# Patient Record
Sex: Female | Born: 1945 | Race: Black or African American | Hispanic: No | Marital: Married | Smoking: Never smoker
Health system: Southern US, Community
[De-identification: ages and names within clinical notes are randomized; demographics above are authoritative.]

## PROBLEM LIST (undated history)

## (undated) DIAGNOSIS — I509 Heart failure, unspecified: Secondary | ICD-10-CM

## (undated) DIAGNOSIS — K219 Gastro-esophageal reflux disease without esophagitis: Secondary | ICD-10-CM

## (undated) DIAGNOSIS — I10 Essential (primary) hypertension: Secondary | ICD-10-CM

## (undated) DIAGNOSIS — Q210 Ventricular septal defect: Secondary | ICD-10-CM

## (undated) DIAGNOSIS — I4891 Unspecified atrial fibrillation: Secondary | ICD-10-CM

## (undated) DIAGNOSIS — E785 Hyperlipidemia, unspecified: Secondary | ICD-10-CM

## (undated) DIAGNOSIS — F419 Anxiety disorder, unspecified: Secondary | ICD-10-CM

## (undated) DIAGNOSIS — I272 Pulmonary hypertension, unspecified: Secondary | ICD-10-CM

## (undated) HISTORY — PX: TOTAL ABDOMINAL HYSTERECTOMY W/ BILATERAL SALPINGOOPHORECTOMY: SHX83

## (undated) HISTORY — PX: CARDIAC CATHETERIZATION: SHX172

## (undated) HISTORY — PX: EYE SURGERY: SHX253

---

## 2008-03-16 ENCOUNTER — Other Ambulatory Visit: Payer: Self-pay

## 2008-03-16 ENCOUNTER — Emergency Department: Payer: Self-pay | Admitting: Emergency Medicine

## 2009-12-26 ENCOUNTER — Emergency Department: Payer: Self-pay | Admitting: Emergency Medicine

## 2010-08-19 ENCOUNTER — Ambulatory Visit: Payer: Self-pay | Admitting: Cardiovascular Disease

## 2010-09-29 ENCOUNTER — Ambulatory Visit: Payer: Self-pay | Admitting: Family Medicine

## 2010-10-16 ENCOUNTER — Ambulatory Visit: Payer: Self-pay | Admitting: Family Medicine

## 2010-11-16 ENCOUNTER — Ambulatory Visit: Payer: Self-pay | Admitting: Family Medicine

## 2011-02-23 ENCOUNTER — Inpatient Hospital Stay: Payer: Self-pay | Admitting: Internal Medicine

## 2011-02-23 DIAGNOSIS — I369 Nonrheumatic tricuspid valve disorder, unspecified: Secondary | ICD-10-CM

## 2011-02-23 DIAGNOSIS — R000 Tachycardia, unspecified: Secondary | ICD-10-CM

## 2011-02-23 DIAGNOSIS — R7989 Other specified abnormal findings of blood chemistry: Secondary | ICD-10-CM

## 2011-11-03 ENCOUNTER — Emergency Department: Payer: Self-pay | Admitting: Emergency Medicine

## 2012-07-08 ENCOUNTER — Inpatient Hospital Stay: Payer: Self-pay | Admitting: Student

## 2012-07-08 DIAGNOSIS — I4891 Unspecified atrial fibrillation: Secondary | ICD-10-CM

## 2012-07-08 DIAGNOSIS — I059 Rheumatic mitral valve disease, unspecified: Secondary | ICD-10-CM

## 2012-07-08 LAB — TROPONIN I: Troponin-I: 0.11 ng/mL — ABNORMAL HIGH

## 2012-07-08 LAB — CK TOTAL AND CKMB (NOT AT ARMC)
CK, Total: 71 U/L (ref 21–215)
CK, Total: 71 U/L (ref 21–215)
CK-MB: 1.5 ng/mL (ref 0.5–3.6)

## 2012-07-08 LAB — COMPREHENSIVE METABOLIC PANEL
Anion Gap: 10 (ref 7–16)
BUN: 9 mg/dL (ref 7–18)
Calcium, Total: 9 mg/dL (ref 8.5–10.1)
Chloride: 106 mmol/L (ref 98–107)
Co2: 26 mmol/L (ref 21–32)
Creatinine: 0.66 mg/dL (ref 0.60–1.30)
EGFR (African American): >60
Osmolality: 284 (ref 275–301)
SGOT(AST): 15 U/L (ref 15–37)
Sodium: 142 mmol/L (ref 136–145)

## 2012-07-08 LAB — URINALYSIS, COMPLETE
Bacteria: NONE SEEN
Glucose,UR: NEGATIVE mg/dL (ref 0–75)
Leukocyte Esterase: NEGATIVE
Nitrite: NEGATIVE
Protein: 100
RBC,UR: <1 /HPF (ref 0–5)
WBC UR: <1 /HPF (ref 0–5)

## 2012-07-08 LAB — APTT: Activated PTT: 92.1 secs — ABNORMAL HIGH (ref 23.6–35.9)

## 2012-07-08 LAB — CBC
HGB: 15.7 g/dL (ref 12.0–16.0)
MCH: 29.4 pg (ref 26.0–34.0)
MCV: 90 fL (ref 80–100)
RBC: 5.33 10*6/uL — ABNORMAL HIGH (ref 3.80–5.20)
RDW: 16.5 % — ABNORMAL HIGH (ref 11.5–14.5)
WBC: 10.9 10*3/uL (ref 3.6–11.0)

## 2012-07-09 LAB — CK TOTAL AND CKMB (NOT AT ARMC): CK-MB: 2.7 ng/mL (ref 0.5–3.6)

## 2012-07-09 LAB — CBC WITH DIFFERENTIAL/PLATELET
Basophil %: 1.1 %
Eosinophil #: 0.2 10*3/uL (ref 0.0–0.7)
HGB: 14.5 g/dL (ref 12.0–16.0)
Lymphocyte %: 34.7 %
MCH: 29.1 pg (ref 26.0–34.0)
MCHC: 32.8 g/dL (ref 32.0–36.0)
MCV: 89 fL (ref 80–100)
Monocyte #: 0.8 x10 3/mm (ref 0.2–0.9)
Neutrophil #: 5.8 10*3/uL (ref 1.4–6.5)
Neutrophil %: 55 %
RDW: 16.6 % — ABNORMAL HIGH (ref 11.5–14.5)
WBC: 10.6 10*3/uL (ref 3.6–11.0)

## 2012-07-09 LAB — PROTIME-INR: INR: 1

## 2012-07-09 LAB — LIPID PANEL: Ldl Cholesterol, Calc: 111 mg/dL — ABNORMAL HIGH (ref 0–100)

## 2012-07-09 LAB — BASIC METABOLIC PANEL
Co2: 26 mmol/L (ref 21–32)
Creatinine: 0.62 mg/dL (ref 0.60–1.30)
EGFR (Non-African Amer.): >60
Glucose: 100 mg/dL — ABNORMAL HIGH (ref 65–99)
Osmolality: 282 (ref 275–301)
Potassium: 4 mmol/L (ref 3.5–5.1)
Sodium: 141 mmol/L (ref 136–145)

## 2012-07-09 LAB — HEMOGLOBIN A1C: Hemoglobin A1C: 6.3 % (ref 4.2–6.3)

## 2012-07-10 LAB — APTT: Activated PTT: 123.9 secs — ABNORMAL HIGH (ref 23.6–35.9)

## 2013-02-02 ENCOUNTER — Inpatient Hospital Stay: Payer: Self-pay | Admitting: Internal Medicine

## 2013-02-02 DIAGNOSIS — I4891 Unspecified atrial fibrillation: Secondary | ICD-10-CM

## 2013-02-02 LAB — COMPREHENSIVE METABOLIC PANEL WITH GFR
Albumin: 3.7 g/dL
Alkaline Phosphatase: 58 U/L
Anion Gap: 8
BUN: 14 mg/dL
Bilirubin,Total: 0.4 mg/dL
Calcium, Total: 8.8 mg/dL
Chloride: 106 mmol/L
Co2: 25 mmol/L
Creatinine: 0.63 mg/dL
EGFR (African American): >60
EGFR (Non-African Amer.): >60
Glucose: 118 mg/dL — ABNORMAL HIGH
Osmolality: 279
Potassium: 4.2 mmol/L
SGOT(AST): 19 U/L
SGPT (ALT): 23 U/L
Sodium: 139 mmol/L
Total Protein: 7.6 g/dL

## 2013-02-02 LAB — CBC
HCT: 46.7 % (ref 35.0–47.0)
MCH: 29.6 pg (ref 26.0–34.0)
MCHC: 33 g/dL (ref 32.0–36.0)
MCV: 90 fL (ref 80–100)
Platelet: 272 10*3/uL (ref 150–440)
RDW: 16.7 % — ABNORMAL HIGH (ref 11.5–14.5)

## 2013-02-02 LAB — URINALYSIS, COMPLETE
Bilirubin,UR: NEGATIVE
Glucose,UR: NEGATIVE mg/dL (ref 0–75)
Ketone: NEGATIVE
Leukocyte Esterase: NEGATIVE
Protein: NEGATIVE
RBC,UR: <1 /HPF (ref 0–5)
Specific Gravity: 1.006 (ref 1.003–1.030)
WBC UR: 1 /HPF (ref 0–5)

## 2013-02-02 LAB — PROTIME-INR: Prothrombin Time: 23.2 secs — ABNORMAL HIGH (ref 11.5–14.7)

## 2013-02-02 LAB — CK TOTAL AND CKMB (NOT AT ARMC)
CK, Total: 61 U/L (ref 21–215)
CK-MB: 1.1 ng/mL (ref 0.5–3.6)

## 2013-02-02 LAB — TROPONIN I: Troponin-I: 0.13 ng/mL — ABNORMAL HIGH

## 2013-02-03 DIAGNOSIS — I369 Nonrheumatic tricuspid valve disorder, unspecified: Secondary | ICD-10-CM

## 2013-02-03 DIAGNOSIS — Q211 Atrial septal defect: Secondary | ICD-10-CM

## 2013-02-03 LAB — CBC WITH DIFFERENTIAL/PLATELET
Basophil #: 0.1 10*3/uL (ref 0.0–0.1)
Basophil %: 1 %
Eosinophil #: 0.2 10*3/uL (ref 0.0–0.7)
HCT: 41.4 % (ref 35.0–47.0)
HGB: 13.8 g/dL (ref 12.0–16.0)
Lymphocyte #: 3 10*3/uL (ref 1.0–3.6)
MCHC: 33.4 g/dL (ref 32.0–36.0)
MCV: 91 fL (ref 80–100)
Neutrophil %: 45.9 %
RBC: 4.57 10*6/uL (ref 3.80–5.20)
RDW: 16.3 % — ABNORMAL HIGH (ref 11.5–14.5)

## 2013-02-03 LAB — BASIC METABOLIC PANEL
Anion Gap: 7 (ref 7–16)
BUN: 14 mg/dL (ref 7–18)
Calcium, Total: 8.2 mg/dL — ABNORMAL LOW (ref 8.5–10.1)
Chloride: 110 mmol/L — ABNORMAL HIGH (ref 98–107)
Creatinine: 0.6 mg/dL (ref 0.60–1.30)
EGFR (African American): >60
Potassium: 3.9 mmol/L (ref 3.5–5.1)

## 2013-02-03 LAB — TROPONIN I: Troponin-I: 0.19 ng/mL — ABNORMAL HIGH

## 2013-02-03 LAB — PROTIME-INR: INR: 2

## 2013-02-03 LAB — LIPID PANEL: Ldl Cholesterol, Calc: 152 mg/dL — ABNORMAL HIGH (ref 0–100)

## 2013-02-03 LAB — MAGNESIUM: Magnesium: 2 mg/dL

## 2015-03-05 NOTE — Discharge Summary (Signed)
PATIENT NAME:  Haley Adams, Haley Adams MR#:  [id number] DATE OF BIRTH:  1946-04-05  DATE OF ADMISSION:  07/08/2012 DATE OF DISCHARGE:  07/10/2012  PRIMARY CARE PHYSICIAN: Leim FabryBarbara Aldridge, MD   PRIMARY CARDIOLOGIST: Dr. Elesa MassedWard at [facility]   CONSULTANTS: Dr. Mariah MillingGollan and Dr. Johney FrameAllred from [facility]    CHIEF COMPLAINT: Palpitations.   DISCHARGE DIAGNOSES:  1. New onset atrial fibrillation with rapid ventricular response. 2. History of VSD. 3. Possible PFO. 4. Eisenmenger syndrome. 5. Asthma. 6. Pulmonary hypertension. 7. Elevated troponin from demand ischemia.  8. History of NSTEMI in 2012.  9. History of hypertension.   DISCHARGE MEDICATIONS:  1. Metoprolol 50 mg 1 tab 2 times a day.  2. Aspirin 81 mg 1 tab once a day.  3. Fish Oil 1600 mg once a day  4. Clindamycin 300 mg two caps orally as needed one hour prior to surgical appointments. 5. Tracleer 125 mg 1 tab 2 times a day.  6. Vitamin D3 5000 international units 1/2 tab 1 tab daily.  7. Spironolactone 25 mg 1 tab once a day as needed for fluid build-up. 8. Vitamin B2 1 tab once a day. 9. Advair 250/50 one puff inhaled two times a day. 10. Tadalafil 20 mg 2 tabs once a day.  11. Warfarin 5 mg daily.  12. Diltiazem 360 mg extended-release 1 tab daily.   DIET: Low sodium, low fat, low cholesterol.   ACTIVITY: As tolerated.   FOLLOW-UP:  1. Please follow-up with your primary care physician for INR check within 1 to 2 days.  2. Please follow-up with your cardiologist within 1 to 2 weeks.  3. Please check your INR in 1 to 2 days. Keep the INR between 2 and 3 as discussed.    DISPOSITION: Home.   HISTORY OF PRESENT ILLNESS: For full details of history and physical, please see the dictation on 07/08/2012 by Dr. Jacques NavyAhmadzia. Briefly, this is a 69 year old African American female with uncorrected VSD, Eisenmenger syndrome, and pulmonary hypertension who presented with palpitation and in the ER was found to have significant tachycardia  with rates as high as 180's and was given diltiazem IV and admitted to the hospitalist service.   SIGNIFICANT LABS AND IMAGING: Initial troponin 0.11, then 0.29, then 0.29. CK-MB were within normal limits x3. Initial creatinine 0.66, potassium 3.8. LDL 111. Initial WBC 10.9, hemoglobin 15.7. INR on August 24th was 1. Urinalysis not suggestive of infection.   Echocardiogram showing EF of 50 to 55% with 1.29 cm in size VSD. RV is moderately dilated. A patent foramen ovale is suspected. Mild to moderate mitral regurgitation and tricuspid regurgitation. RVSP is greater than 60 mmHg. Elevated RVSP consistent with severe pulmonary hypertension.  X-ray of the chest, one view, showing findings consistent with CHF and mild pulmonary interstitial edema.   HOSPITAL COURSE: The patient was admitted to the hospitalist service. For the atrial fibrillation, although Cardizem drip was initially ordered it was not eventually started and patient was transitioned to the diltiazem p.o. 30 mg q.6 hours which was gradually increased. The patient was seen by Cardiology from the [facility]. Echocardiogram was obtained and Cardizem was increased to 360 mg. With that dose in addition to the beta-blocker the heart rate control improved substantially and currently is in 70 to 80's. An echocardiogram was obtained the result of which is above. I discussed the case with Dr. Johney FrameAllred today who is okay with discharge with Coumadin without Lovenox bridge at this point. The patient has no history of diabetes,  age is less than 75. I discussed with her to keep the INR between 2 and 3 and follow-up with her primary care physician and primary cardiologist. The patient did have a bump in troponins likely from the tachycardia. The patient did not have any chest pain. While hospitalized her pulmonary hypertension and asthma medications were continued. At this point she will be discharged with outpatient follow-up.   DISPOSITION: Home.   CODE  STATUS: FULL CODE.   TOTAL TIME SPENT: 35 minutes.    ____________________________ Krystal Eaton, MD sa:drc D: 07/10/2012 10:13:49 ET T: 07/10/2012 13:24:34 ET JOB#: [id number]  cc: Krystal Eaton, MD, <Dictator> Katina Dung. Dayna Barker, MD Krystal Eaton MD ELECTRONICALLY SIGNED 07/27/2012 0:16

## 2015-03-05 NOTE — Consult Note (Signed)
General Aspect 69 year old [place] American female with history of uncorrected VSD with Eisenmenger syndrome with severe pulmonary hypertension status post catheterization four years ago showing no significant coronary artery disease, presenting with palpitations. Cardiology was consulted for atrial fibrillation.   this morning at 8:15 she was in the kitchen and about to take her morning medications. she felt  rapid heart rate without any chest pains, shortness of breath or dizziness. It was felt to be irregular. She has previously felt this before, but "never had atrial fibrillation".   In the ER,  she was found to have atrial fibrillation with rapid ventricular rate.Rate 180 bpm. She did not have any chest pain. She denies having any recent urinary tract infection or upper respiratory infection symptoms, no abdominal symptoms either. She does report having a low grade fever?   She received diltiazem IV 25 mg x1. heart rate improved to  70s to 90s. She was started on diltiazem 30 mg Q6 and metoprolol 50 BID. On evaluation this PM, heart rate is in the 120s.  She reports having a "sulfa" allergy, and not on lasix. She has had waxing waning abd swelling    Present Illness . PAST MEDICAL HISTORY:  1. Hypertension. 2. Uncorrected ventricular septal defect (VSD) 3. Anxiety. 4. Asthma. 5. History of Eisenmenger syndrome. 6. Pulmonary hypertension. 7. History of non-ST-elevation myocardial infarction in 2012.   PAST SURGICAL HISTORY:  1. Hysterectomy.  2. Tubal ligation. 3. IUD removal. 4. Therapeutic abortion.   ALLERGIES: Sulfa, penicillin, and Tylenol.   CURRENT MEDICATIONS:  1. Adcirca 20 mg 2 tabs once a day. 2. Advair 250/50 mcg one puff twice a day. 3. Aspirin 81 mg two tabs once a day. 4. Clindamycin p.r.n. prior to invasive procedures.  5. Fish oil 1600 mg once a day. 6. Lisinopril 2.5 mg daily.  7. Metoprolol tartrate 50 mg twice a day.  8. Spironolactone 25 mg as needed  for fluid build-up. 9. Tracleer 125 mg twice a day. 10. Vitamin B12 1 tab daily.  11. Vitamin D3 2500 international units daily.   FAMILY HISTORY: No significant history of coronary artery disease.   SOCIAL HISTORY: No tobacco, alcohol, or drug use.   Physical Exam:   GEN well developed, well nourished, no acute distress    HEENT red conjunctivae, hearing intact to voice    NECK supple  No masses    RESP normal resp effort  clear BS    CARD Regular rate and rhythm  Murmur    Murmur Systolic    Systolic Murmur Out flow    ABD denies tenderness  soft    LYMPH negative neck    EXTR negative cyanosis/clubbing, negative edema    SKIN normal to palpation    NEURO motor/sensory function intact    PSYCH alert, A+O to time, place, person, good insight   Review of Systems:   Subjective/Chief Complaint palpitations    General: Weakness  nervous    Skin: No Complaints    ENT: No Complaints    Eyes: No Complaints    Neck: No Complaints    Respiratory: No Complaints    Cardiovascular: Palpitations    Gastrointestinal: No Complaints    Genitourinary: No Complaints    Vascular: No Complaints    Musculoskeletal: No Complaints    Neurologic: No Complaints    Hematologic: No Complaints    Endocrine: No Complaints    Psychiatric: No Complaints    Review of Systems: All other systems were reviewed  and found to be negative    Medications/Allergies Reviewed Medications/Allergies reviewed    (Removed):        Admit Diagnosis:   ATRAIL FIBRILLATION: 08-Jul-2012, Active, ATRAIL FIBRILLATION  Home Medications: Medication Instructions Status  metoprolol tartrate 50 mg oral tablet 1 tab(s) orally 2 times a day  Active  lisinopril 5 mg oral tablet 0.5 tab(s) orally once a day Active  aspirin 81 mg tablet 2 tab(s) orally once a day Active  Fish Oil oral capsule 1600 milligram(s) orally once a day Active  clindamycin 300 mg oral capsule 2 cap(s) orally once, As  Needed 1 hour prior to appointment Active  Tracleer 125 mg tablet 1 tab(s) orally 2 times a day  Active  Vitamin D3 5000 intl units oral tablet 0.5 tab(s) (2500 units) orally once a day Active  Adcirca 20 mg oral tablet 2 tab(s) orally once a day Active  spironolactone 25 mg oral tablet 1 tab(s) orally once a day, As Needed for fluid build-up Active  Vitamin B2 1 tab(s) orally once a day Active  Advair Diskus 250 mcg-50 mcg inhalation powder 1 puff(s) inhaled 2 times a day Active   Lab Results:  Hepatic:  23-Aug-13 08:51    Bilirubin, Total 0.4   Alkaline Phosphatase 67   SGPT (ALT) 17   SGOT (AST) 15   Total Protein, Serum 7.8   Albumin, Serum 3.9  Routine Chem:  23-Aug-13 08:51    Result Comment TROPONIN - RESULTS VERIFIED BY REPEAT TESTING.  - CALLED TO TINA CARR AT 9485 07/08/2012  - VFM  - READ-BACK PROCESS PERFORMED.  Result(s) reported on 08 Jul 2012 at 09:59AM.   Glucose, Serum  135   BUN 9   Creatinine (comp) 0.66   Sodium, Serum 142   Potassium, Serum 3.8   Chloride, Serum 106   CO2, Serum 26   Calcium (Total), Serum 9.0   Osmolality (calc) 284   eGFR (African American) >60   eGFR (Non-African American) >60 (eGFR values <73m/min/1.73 m2 may be an indication of chronic kidney disease (CKD). Calculated eGFR is useful in patients with stable renal function. The eGFR calculation will not be reliable in acutely ill patients when serum creatinine is changing rapidly. It is not useful in  patients on dialysis. The eGFR calculation may not be applicable to patients at the low and high extremes of body sizes, pregnant women, and vegetarians.)   Anion Gap 10  Cardiac:  23-Aug-13 08:51    CK, Total 71   CPK-MB, Serum 1.5 (Result(s) reported on 08 Jul 2012 at 09:43AM.)   Troponin I  0.11 (0.00-0.05 0.05 ng/mL or less: NEGATIVE  Repeat testing in 3-6 hrs  if clinically indicated. >0.05 ng/mL: POTENTIAL  MYOCARDIAL INJURY. Repeat  testing in 3-6 hrs if  clinically  indicated. NOTE: An increase or decrease  of 30% or more on serial  testing suggests a  clinically important change)  Routine Coag:  23-Aug-13 08:51    Activated PTT (APTT) 29.2 (A HCT value >55% may artifactually increase the APTT. In one study, the increase was an average of 19%. Reference: "Effect on Routine and Special Coagulation Testing Values of Citrate Anticoagulant Adjustment in Patients with High HCT Values." American Journal of Clinical Pathology 2006;126:400-405.)  Routine Hem:  23-Aug-13 08:51    WBC (CBC) 10.9   RBC (CBC)  5.33   Hemoglobin (CBC) 15.7   Hematocrit (CBC)  47.9   Platelet Count (CBC) 236 (Result(s) reported on 08 Jul 2012 at  09:32AM.)   MCV 90   MCH 29.4   MCHC 32.7   RDW  16.5   EKG:   Interpretation EKG shows atrial fibrillation at rate of 180 bpm, improved to 97 bpm on follow up ekg    PCN: Hives  Tylenol: Headaches  Sulfa drugs: Unknown  Vital Signs/Nurse's Notes: **Vital Signs.:   23-Aug-13 15:22   Vital Signs Type Routine   Temperature Temperature (F) 99.4   Celsius 37.4   Temperature Source Oral   Pulse Pulse 83   Systolic BP Systolic BP 585   Diastolic BP (mmHg) Diastolic BP (mmHg) 73   Mean BP 89   Pulse Ox % Pulse Ox % 91   Pulse Ox Activity Level  At rest   Oxygen Delivery Room Air/ 21 %     Impression 69 year old [place] American female with history of uncorrected VSD with Eisenmenger syndrome with severe pulmonary hypertension status post catheterization four years ago showing no significant coronary artery disease, presenting with palpitations. Cardiology was consulted for atrial fibrillation.  1) Atrial fibrillation: Rate still poorly controlled on po meds: Will increase diltiazem to 60 mg q6, continue metoprolol 50 BID Hesitant to start amiodarone infusion at this time. Could consider starting amiodarone if no rate improvement with other meds. --Onset of atrial fib appeared to be 8/23 in AM.  -She appears to be  tolerating atrial fib well. Given underlying structural disease, she may be difficult to convert back to NSR and maintain this rhythm.  --Consider start warfarin  2) VSD/Eisenmengers Severe pulmonary HTN on echo She reports "sulfa" allergy and not on lasix. Currently appears comfortable. Would continue outpt meds. If she develops worsening SOB, could consider fluid restriction, rate control, possibly even cardioversion.   Electronic Signatures: Ida Rogue (MD)  (Signed 23-Aug-13 19:17)  Authored: General Aspect/Present Illness, History and Physical Exam, Review of System, Past Medical History, Health Issues, Home Medications, Labs, EKG , Allergies, Vital Signs/Nurse's Notes, Impression/Plan   Last Updated: 23-Aug-13 19:17 by Ida Rogue (MD)

## 2015-03-05 NOTE — H&P (Signed)
PATIENT NAME:  Haley Adams, Haley Adams MR#:  [id number] DATE OF BIRTH:  02/10/1946  DATE OF ADMISSION:  07/08/2012  CHIEF COMPLAINT: Palpitations.  PRIMARY CARE PHYSICIAN: Leim Fabry, MD  CARDIOLOGIST:  Dr. Elesa Massed - [facility]   REFERRING PHYSICIAN: Glennie Isle, MD  HISTORY OF PRESENT ILLNESS: The patient is a pleasant 69 year old African American female with history of uncorrected VSD with Eisenmenger syndrome with pulmonary hypertension status post catheterization four years ago showing no significant coronary artery disease on chart who presents with the above chief complaint. The patient stated that at this morning at 8:15 she was in the kitchen and about to take her morning medications. However, she felt  rapid heart rate without any chest pains, shortness of breath or dizziness. It was felt to be irregular. On arrival here, she was found to have atrial fibrillation with rapid ventricular rate. Per staff it went as high as 180s. The first EKG is at 169 beats minutes. She did not have any chest pain. She denies having any recent urinary tract infection or upper respiratory infection symptoms, no abdominal symptoms either. She received diltiazem IV 25 mg x1 and then diltiazem drip was ordered, however, it has not been restarted as the heart rate has been stabilized in the 70s to 90s. The patient denies having any palpitations now. Hospitalist service was contacted for further evaluation and management.   PAST MEDICAL HISTORY:  1. Hypertension. 2. Uncorrected ventricular septal defect (VSD) 3. Anxiety. 4. Asthma. 5. History of Eisenmenger syndrome. 6. Pulmonary hypertension. 7. History of non-ST-elevation myocardial infarction in 2012.   PAST SURGICAL HISTORY:  1. Hysterectomy.  2. Tubal ligation. 3. IUD removal. 4. Therapeutic abortion.   ALLERGIES: Sulfa, penicillin, and Tylenol.   CURRENT MEDICATIONS:  1. Adcirca 20 mg 2 tabs once a day. 2. Advair 250/50 mcg  one puff twice a day. 3. Aspirin 81 mg two tabs once a day. 4. Clindamycin p.r.n. prior to invasive procedures.  5. Fish oil 1600 mg once a day. 6. Lisinopril 2.5 mg daily.  7. Metoprolol tartrate 50 mg twice a day.  8. Spironolactone 25 mg as needed for fluid build-up. 9. Tracleer 125 mg twice a day. 10. Vitamin B12 1 tab daily.  11. Vitamin D3 2500 international units daily.   FAMILY HISTORY: No significant history of coronary artery disease.   SOCIAL HISTORY: No tobacco, alcohol, or drug use.  REVIEW OF SYSTEMS: CONSTITUTIONAL: No fever or weight loss. Five pound weight gain in the last several weeks. EYES: No blurry vision or double vision. ENT: No tinnitus or hearing loss. No postnasal drip loss. RESPIRATORY: No cough or wheezing. Has history of asthma. CARDIOVASCULAR: No chest pain, orthopnea, edema, or dyspnea on exertion. Has history of VSD and Eisenmenger syndrome and pulmonary hypertension. GASTROINTESTINAL: No nausea, vomiting, diarrhea, or abdominal pain. GU: Denies dysuria or incontinence. ENDOCRINE: No polyuria or nocturia or thyroid problems. HEME/LYMPH: No anemia or easy bruising. SKIN: No new rashes. MUSCULOSKELETAL: No numbness, ataxia, or cerebrovascular accident. PSYCHIATRIC: No anxiety or insomnia currently. Has history of anxiety in the past.   PHYSICAL EXAMINATION:   VITAL SIGNS: Temperature on arrival 97.8, pulse rate 150, respiratory rate 20, blood pressure on arrival 190/109 and last blood pressure 140s/80s when I was in the room, and oxygen saturation on arrival 98% on room air and currently 91% on room air.   GENERAL: The patient is a pleasant African American female lying in bed in no obvious distress, talking in full sentences.  HEENT: Normocephalic hepatic. Pupils are equal and reactive. Extraocular muscles are intact. No lid lag. Oropharynx clear. Moist mucous membranes. Anicteric sclerae.   NECK: Supple. No thyroid tenderness.   LUNGS: Good effort without  wheezing or rhonchi.   ABDOMEN: Soft, nontender, and nondistended. No organomegaly appreciated. Positive bowel sounds in all quadrants.   HEART: S1 and S2 irregularly, irregular.  No murmurs, rubs, or gallops appreciated.   EXTREMITIES: No significant lower extremity edema.   SKIN: No obvious rashes.   NEUROLOGIC: Cranial nerves II through XII grossly intact. Strength is 5 out of 5 in all extremities.   PSYCH: Pleasant and cooperative. Awake, alert, and oriented x3.   LABORATORY, DIAGNOSTIC AND RADIOLOGIC DATA: Glucose 135, BUN 9, creatinine 0.66, sodium 142, and potassium 3.8. LFTs within normal limits. Initial troponin 0.11. CK-MB 1.5. Hemoglobin 15.7, hematocrit 47.9, and WBC 10.9.   Urinalysis not suggestive of infection.   EKG: Initial EKG is showing atrial fibrillation, rate 169. Another EKG is showing atrial fibrillation with RVR, rate of 180. There are some ST depressions mostly in lateral leads V4, V5, and V6 which appears to be improved somewhat on later EKG but there is diffuse T wave inversions from V2 to V5, on the later EKG. Later EKG shows rate of 97. Also some moderate voltage criteria for left ventricular hypertrophy. No ST elevations apparent.  X-ray of the chest - pending and performed.   ASSESSMENT AND PLAN: We have a pleasant 69 year old African American female with a history of uncorrected VSD with Eisenmenger's syndrome, status post catheterization several years ago without any significant coronary artery disease per chart with pulmonary hypertension, NSTEMI, anxiety, and asthma who presents with acute onset of atrial fibrillation with RVR, currently rate controlled with diltiazem x1. The patient has no significant chest pain although has a positive troponin and some ST depressions on the EKG showing possible ischemia. The patient has no history of atrial fibrillation in the past. At this point, as the rate has been stabilized, we would admit the patient to telemetry,  start the patient on heparin drip and start the patient on Cardizem 30 mg p.o. every six hours in addition to the metoprolol and hold the lisinopril to prevent hypotension. The blood pressure did drop from 190s on arrival to 90s after the diltiazem IV which was given to control the heart rate. The last blood pressure has trended back up to 140s, which allows the addition of the diltiazem at this point. We would obtain a cardiology consult and get an echocardiogram. We would also give aspirin for now. The patient does have elevated troponin, but did not have any chest pain at all today. It is possible this is secondary to demand ischemia from the significant tachycardia she was experiencing. We would cycle the troponins, get an echocardiogram, and see what cardiology has to stay, but she did have a catheterization four years ago without any significant coronary artery disease, per chart, and at this point she has no chest pain. We would continue the aspirin, the beta blocker, check a lipid profile and admit the patient to telemetry. We would continue her pulmonary hypertension medications, resume her Advair and vitamin D.   CODE STATUS: FULL CODE.   TOTAL TIME SPENT: 50 minutes.  ____________________________ Krystal EatonShayiq Nishita Isaacks, MD sa:slb D: 07/08/2012 12:29:50 ET T: 07/08/2012 12:53:39 ET JOB#: [id number]  cc: Krystal EatonShayiq Frankie Scipio, MD, <Dictator> Katina DungBarbara D. Dayna BarkerAldridge, MD Krystal EatonSHAYIQ Keshawn Sundberg MD ELECTRONICALLY SIGNED 07/27/2012 0:16

## 2015-03-08 NOTE — Discharge Summary (Signed)
PATIENT NAME:  Haley Adams, Haley Adams MR#:  [id number] DATE OF BIRTH:  1945/12/17  DATE OF ADMISSION:  02/02/2013 DATE OF DISCHARGE:  02/03/2013  DISCHARGE DIAGNOSES: 1.  Atrial fibrillation with rapid ventricular response status post cardioversion, stable. 2.  Hypotension due to receiving Cardizem, resolved. 3.  Chronic pulmonary hypertension.  HISTORY OF PRESENT ILLNESS:  The patient is a 69 year old female who presented with heart beating very fast, The patient reported that she was trying to fix the breakfast and took her medication earlier in the morning. was breathing very passed.   EMS was called and she arrived in ED her heart rate was 130, she was in atrial fibrillation and RVR.  She was given IV diltiazem and improvement in her heart rate; however, the blood pressure dropped to 80s and given IV fluid bolus.  Her blood pressure was improved and she was noted to have 8 to 10 beats of ventricular tachycardia and so she was admitted for further management.   HOSPITAL COURSE AND STAY:  She also had a history of Eisenmenger syndrome and pulmonary hypertension, history of ventricular septal defect and status post surgeries for that. She also has had atrial fibrillation in the past and cardioversion was done in September at [facility] and she was following with cardiologist over there.  These were all reasons to admit her after the atrial fibrillation episode.  Cardiologist, Dr. Julien Nordmann, followed her while she was in the hospital and he did the cardioversion.  The patient was in normal sinus rhythm. He advised if her blood pressure remained stable, she can be discharged home and she can follow in [facility] with her cardiologist and may consider to change sotalol to some other anti-arrhythmic.  For Eisenmenger's syndrome, he advised to avoid calcium channel blockers.  Other medical issues addressed during this hospital stay:   1.  Ventricular tachycardia, 8 to 10 beats, remained stable. 2.   Elevated cardiac enzymes that was due to atrial fibrillation with RVR.  The cardiologist saw her and no further work-up advised. 3.  Atrial fibrillation on Coumadin.  We continued that here and advised her to follow with her primary care physician in [place]. 4.  Pulmonary hypertension. Continue tadalafil and Tracleer.    LABORATORY RESULTS IN THE HOSPITAL:   Hemoglobin 15.4, INR 2.1, BMP was stable on admission, TSH at 3.62 and magnesium was 2.1   CODE STATUS: Full code.   CONDITION ON DISCHARGE: Stable.   MEDICATIONS ON DISCHARGE:  Tracleer 125 mg tablet 2 times a day, diazepam 2 mg oral tablet every 6 hours as needed for anxiety and nervousness, Advair 1 puff every 12 hours, lisinopril 2.5 mg oral tablet once a day, sotalol 80 mg oral tablet take 1/2 tablet 2 times a day,  tadalafil 20 mg oral tablet once a day, torsemide 20 mg oral tablet once a day as needed, Coumadin 9 to 10 mg once a day ass he was taking before, omega-3 fatty acid orally once a day, vitamin B2 mg oral tablet once a day and vitamin D3 1000 and international units oral capsule once a day.   DIET: Low sodium.   DIET CONSISTENCY ON DISCHARGE:  Regular.   ACTIVITY LIMITATION:  None.   TIME FRAME TO FOLLOW UP:  One to 2 weeks. Advised to follow with cardiologist in [facility] in 1 to 2 weeks about considering changing anti-arrhythmic medications or if he wants to continue sotalol.   Total Time Spent: 45 minutes.     ____________________________ Heath Gold  Arrie EasternG. Desmond Tufano, MD vgv:ct D: 02/07/2013 09:01:14 ET T: 02/07/2013 09:28:45 ET JOB#: [id number]  cc: Hope PigeonVaibhavkumar G. Elisabeth PigeonVachhani, MD, <Dictator> Altamese DillingVAIBHAVKUMAR Cleotha Whalin MD ELECTRONICALLY SIGNED 02/16/2013 14:51

## 2015-03-08 NOTE — Consult Note (Signed)
General Aspect Haley Adams is a 69 yo African-American female with PMHx s/f persistent atrial fibrillation, Eisenmenger syndrome (VSD), PAH, HTN, anxiety and migraines who presents to [facility] ED today with complaints of tachy-palpitations, and found to be in atrial fibrillation with RVR.   She is followed by Dr. Leonides Schanz at [facility]. She reports experiencing her first episode of atrial fibrillation in 07/2012- described as tachy-palpitations. She underwent DCCV, and was started on Sotalol and Coumadin. She was transiently on Digoxin, but this was discontinued for unknown reasons. She had what sounds like a right heart cath 4-5 years ago. Normal stress test "years ago." 2D echo last month looked "good." No h/o MI or CAD.   She had been in her USOH until this AM around 9:30 AM. She had a cold glass of orange juice, then an hour later experienced tachy-palpitations with associates SOB and lightheadedness. No chest pain or syncope. She has noted a small increase in stress. Denies EtOH, tobacco, elicit drug, OTC supplement or caffeine use. No recent fevers, chills or active bleeding. No LE edema, PND or orthopnea. She has been compliant with all medications. She reports INRs have been therapeutic for > 1 month.   In the ED, EKG and telemetry revealed atrial fibrillation with RVR.  Initial trop-I mildly elevated. INR 2.1. CXR with mild CHF. BMET unremarkable. Currently resting comfortably in NAD.   Present Illness . SOCIAL HISTORY: Does not smoke. Drinks occasionally.   FAMILY HISTORY: Positive for hypertension.   Physical Exam:  GEN well developed, thin   HEENT red conjunctivae, PERRL   NECK supple  No masses  trachea midline   RESP normal resp effort  no use of accessory muscles  fine bibasilar rales   CARD Irregular rate and rhythm  Tachycardic  Normal, S1, S2  Murmur  No murmur   ABD denies tenderness  soft  normal BS  no Adominal Mass   EXTR negative cyanosis/clubbing, negative edema    SKIN normal to palpation, No rashes   NEURO follows commands, motor/sensory function intact   PSYCH alert, A+O to time, place, person, good insight   Review of Systems:  Subjective/Chief Complaint tachy-palpitations   General: No Complaints   Skin: No Complaints   ENT: No Complaints   Eyes: No Complaints   Neck: No Complaints   Respiratory: No Complaints  Short of breath   Cardiovascular: Dyspnea   Gastrointestinal: No Complaints   Genitourinary: No Complaints   Vascular: No Complaints   Musculoskeletal: No Complaints   Neurologic: Dizzness   Hematologic: No Complaints   Endocrine: No Complaints   Psychiatric: No Complaints   Review of Systems: All other systems were reviewed and found to be negative   Medications/Allergies Reviewed Medications/Allergies reviewed     Migraines:    eisenmenger:    pulmonary htn:    VSD:    hysterectomy:     Diltiazem injection,  ( Cardizem injection )  10 mg, IV push, once, 02-Feb-2013, Completed, Standard   LORazepam injection,  ( Ativan injection )  0.5 mg, IV push, STAT  Indication: Anxiety/ Seizure/ Antiemetic Adjunct/ Preop Sedation, 02-Feb-2013, Completed, Standard   Acetaminophen * tablet, ( Tylenol (325 mg) tablet)  650 mg Oral q4h PRN for pain or temp. greater than 100.4  - Indication: Pain/Fever, 02-Feb-2013, Active, Standard   Ondansetron injection, ( Zofran injection )  4 mg, IV push, q4h PRN for Nausea/Vomiting  Indication: Nausea/ Vomiting, 02-Feb-2013, Active, Standard   Sotalol Tablet, ( Betapace AF)  120 mg Oral bid  - Indication: Sinus Rhythm in Symptomatic Atrial Fibrillation/Flutter, 02-Feb-2013, Active, Standard   Albuterol Oral inhaler, 2 puff(s) Inhalation q4h while awake with Spacer (Op  - Indication: Bronchodilator  Instructions:  Dustin Folks Code: SendToRx], 02-Feb-2013, Active, Standard   Cholecalciferol  tablet, ( Vitamin D3)  1000 unit(s) Oral daily  - Indication: Vit D  deficiency, 02-Feb-2013, Active, Standard   Diazepam tablet, ( Valium)  2 mg Oral q6h PRN for anxiety  - Indication: Anxiety/ Convulsive Disorders/ Muscle Relaxation/ Alcohol Withdrawal, 02-Feb-2013, Active, Standard   Fluticasone/Salmeterol 250/50 inhaler, ( Advair Diskus 250/50 inhaler )  1 puff(s) Inhalation bid  Instructions:  RINSE MOUTH AFTER USE, 02-Feb-2013, Active, Standard   Omega 3 Fatty Acid 1 Gram Capsule, ( Lovaza)  1 gram Oral daily, 02-Feb-2013, Active, Standard   Non-Formulary Medication, tadalafil  20 mg Oral daily, 02-Feb-2013, Active, Standard   Non-Formulary Medication, tracleer  125 mg Oral daily, 02-Feb-2013, Active, Standard   Warfarin tablet,  ( Coumadin)  9 mg Oral q5pm  - Indication: Anticoagulant, Monitor Anticoags per hospital protocol  Instructions:  Dustin Folks Code: Black with pkg], 02-Feb-2013, Active, Standard  Home Medications: Medication Instructions Status  diazepam 2 mg oral tablet 1 tab(s) orally every 6 hours, As Needed- for Anxiety, Nervousness  Active  Advair Diskus 250 mcg-50 mcg inhalation powder 1 puff(s) inhaled every 12 hours Active  lisinopril 2.5 mg oral tablet 1 tab(s) orally once a day Active  sotalol 80 mg oral tablet 1.5 tab(s) (120 mg) orally 2 times a day Active  tadalafil 20 mg oral tablet 1 tab(s) orally once a day Active  torsemide 20 mg oral tablet 1 tab(s) orally once a day, As Needed for swelling Active  warfarin 9-10 milligram(s) orally once a day (in the evening) per INR levels Active  albuterol CFC free 90 mcg/inh inhalation aerosol 2 puff(s) inhaled every 6 hours, As Needed- for Shortness of Breath  Active  omega-3 polyunsaturated fatty acids 1000 mg oral capsule 1 cap(s) orally once a day Active  Vitamin B2 100 mg oral tablet 1 tab(s) orally once a day Active  Vitamin D3 1000 intl units oral capsule 1 cap(s) orally once a day Active  Tracleer 125 mg tablet 1 tab(s) orally 2 times a day  Active   Lab Results:   Thyroid:  20-Mar-14 12:38   Thyroid Stimulating Hormone 3.62 (0.45-4.50 (International Unit)  ----------------------- Pregnant patients have  different reference  ranges for TSH:  - - - - - - - - - -  Pregnant, first trimetser:  0.36 - 2.50 uIU/mL)  Hepatic:  20-Mar-14 12:38   Bilirubin, Total 0.4  Alkaline Phosphatase 58  SGPT (ALT) 23  SGOT (AST) 19  Total Protein, Serum 7.6  Albumin, Serum 3.7  Routine Chem:  20-Mar-14 10:47   Result Comment pt/inr - notified mark winstead rn@er  [phone] [id number]  - of hemolyzed specimen rw  Result(s) reported on 02 Feb 2013 at 11:13AM.    12:38   Magnesium, Serum 2.1 (1.8-2.4 THERAPEUTIC RANGE: 4-7 mg/dL TOXIC: > 10 mg/dL  -----------------------)  Result Comment troponin - RESULTS VERIFIED BY REPEAT TESTING.  - called to mark winstead rn@er   - [id number] rw  - READ-BACK PROCESS PERFORMED.  Result(s) reported on 02 Feb 2013 at 01:32PM.  Glucose, Serum  118  BUN 14  Creatinine (comp) 0.63  Sodium, Serum 139  Potassium, Serum 4.2  Chloride, Serum 106  CO2, Serum 25  Calcium (Total), Serum 8.8  Osmolality (calc) 279  eGFR (African American) >60  eGFR (Non-African American) >60 (eGFR values <24m/min/1.73 m2 may be an indication of chronic kidney disease (CKD). Calculated eGFR is useful in patients with stable renal function. The eGFR calculation will not be reliable in acutely ill patients when serum creatinine is changing rapidly. It is not useful in  patients on dialysis. The eGFR calculation may not be applicable to patients at the low and high extremes of body sizes, pregnant women, and vegetarians.)  Anion Gap 8  Cardiac:  20-Mar-14 12:38   Troponin I  0.08 (0.00-0.05 0.05 ng/mL or less: NEGATIVE  Repeat testing in 3-6 hrs  if clinically indicated. >0.05 ng/mL: POTENTIAL  MYOCARDIAL INJURY. Repeat  testing in 3-6 hrs if  clinically indicated. NOTE: An increase or decrease  of 30% or more on serial  testing suggests  a  clinically important change)  Routine UA:  20-Mar-14 14:33   Color (UA) Straw  Clarity (UA) Clear  Glucose (UA) Negative  Bilirubin (UA) Negative  Ketones (UA) Negative  Specific Gravity (UA) 1.006  Blood (UA) Negative  pH (UA) 6.0  Protein (UA) Negative  Nitrite (UA) Negative  Leukocyte Esterase (UA) Negative (Result(s) reported on 02 Feb 2013 at 02:55PM.)  RBC (UA) <1 /HPF  WBC (UA) 1 /HPF  Bacteria (UA) TRACE  Epithelial Cells (UA) NONE SEEN  Mucous (UA) PRESENT  Hyaline Cast (UA) 7 /LPF (Result(s) reported on 02 Feb 2013 at 02:55PM.)   EKG:  Interpretation atrial fibrillation with RVR, downsloping ST depressions V1-V5 with associated TWIs   Rate 134   Additional Comments No prior comparison   Radiology Results: XRay:    20-Mar-14 10:37, Chest Portable Single View  Chest Portable Single View   REASON FOR EXAM:    Chest pain  COMMENTS:       PROCEDURE: DXR - DXR PORTABLE CHEST SINGLE VIEW  - Feb 02 2013 10:37AM     RESULT: Comparison made to prior study Apr 07, 2012. Lungs clear.   Cardiomegaly with pulmonary vascular prominence. These findings are   stable. Mild pulmonary interstitial pulmonary edema.    IMPRESSION:  Mild congestive heart failure. Similar findings noted 8 23   2013.        Verified By: TOsa Craver M.D., MD    PCN: Hives  Tylenol: Headaches  Sulfa drugs: Unknown  Vital Signs/Nurse's Notes: **Vital Signs.:   20-Mar-14 17:23  Vital Signs Type Admission  Temperature Temperature (F) 98.6  Celsius 37  Temperature Source oral  Pulse Pulse 64  Respirations Respirations 18  Systolic BP Systolic BP 94  Diastolic BP (mmHg) Diastolic BP (mmHg) 66  Mean BP 75  Pulse Ox % Pulse Ox % 92  Pulse Ox Activity Level  At rest  Oxygen Delivery 2L    Impression Haley Adams is a 69yo African-American female with PMHx s/f persistent atrial fibrillation, Eisenmenger syndrome (VSD), PAH, HTN, anxiety and migraines who [facility] with complaints of tachy-palpitations, and found to be in atrial fibrillation with RVR.  1. Atrial fibrillation with RVR --Avoid Ca channel blockers Continue sotolol BID plan for cardioversion in AM  Her INRs per her PCP's office have been therapeutic for > 1 month (2.8 on 2/6, 2.3 on 3/6- have placed faxed report in chart).  2. Eisenmenger syndrome --Avoid Ca channel blockers (she has recovered from BP drop in ER) On low rate IVF for systolic pressures in 794W now up to 90s.  3. Chronic Coumadin anticoagulation INRs  have been >2   4. PAH  5. HTN  6. Anxiety   Electronic Signatures: Meriel Pica (PA-C)  (Signed 20-Mar-14 16:26)  Authored: General Aspect/Present Illness, History and Physical Exam, Review of System, Past Medical History, Orders, Home Medications, Labs, EKG , Radiology, Allergies, Impression/Plan Ida Rogue (MD)  (Signed 20-Mar-14 18:29)  Authored: General Aspect/Present Illness, History and Physical Exam, Review of System, Labs, Vital Signs/Nurse's Notes, Impression/Plan  Co-Signer: General Aspect/Present Illness, History and Physical Exam, Review of System, Past Medical History, Orders, Home Medications, Labs, Radiology, Allergies, Impression/Plan   Last Updated: 20-Mar-14 18:29 by Ida Rogue (MD)

## 2015-03-08 NOTE — H&P (Signed)
PATIENT NAME:  Haley Adams, Haley Adams MR#:  [id number] DATE OF BIRTH:  [birth year]  DATE OF ADMISSION:  02/02/2013  PRIMARY CARE PROVIDER: Dr. Dayna Barker at [facility].  REFERRING PHYSICIAN: Dr. Cyril Loosen, ED.   CHIEF COMPLAINT: Palpitations.   HISTORY OF PRESENT ILLNESS: The patient is a 69 year old who presents with heart beating very fast. The patient reports that she was trying to fix her breakfast and took her medications earlier today and then heart was beating very fast. EMS was called. When she arrived to the ED, her heart rate was in the 130s. She was in atrial fibrillation with RVR. The patient received a dose of IV diltiazem with improvement in her heart rate; however, her blood pressure dropped into the 80s. She was given IV fluid bolus. Her blood pressure is currently improved, but also during this brief ED visit, the patient was noted to have 8 to 10 beats of ventricular tachycardia. Therefore, I was asked to admit the patient. The patient reports that she has not felt well for the past few days and has been very tired and weak. She reports that she has had a feeling of being bloated in her stomach. Has not had any chest pains. She has some chronic shortness of breath due to her pulmonary hypertension that is unchanged. She denies any swelling in her lower extremities.   PAST MEDICAL HISTORY: Significant for: 1.  Migraines.  2.  History of Eisenmenger syndrome.  3.  History of pulmonary hypertension.  4.  History of VSD, which she was born with.  5.  History of atrial fibrillation, status post cardioversion in September at [facility].  6.  History of C. difficile.   PAST SURGICAL HISTORY:  1.  Status post hysterectomy.  2.  Status post IUD removal.   ALLERGIES: PENICILLIN, SULFA DRUGS AND TYLENOL.   CURRENT MEDICATIONS: Advair 250/50 INH q.12, albuterol 2 puffs q.6 p.r.n., diazepam 2 mg q.6 p.r.n., lisinopril 2.5 daily, omega-3 one tab p.o. daily, sotalol 120 mg b.i.d., tadalafil 20 mg  daily, torsemide 20 mg p.r.n. swelling, Tracleer 125 mg 1 tab p.o. b.i.d., vitamin B2 with 100 mg daily, vitamin D3 with 1000 international units daily, warfarin 9 to 10 mg based on INR   SOCIAL HISTORY: Does not smoke. Drinks occasionally.   FAMILY HISTORY: Positive for hypertension.   REVIEW OF SYSTEMS:    CONSTITUTIONAL: Denies any fevers. Complains of fatigue and weakness. No pain. No weight loss. No weight gain.  EYES: No blurred or double vision. No pain. No redness. No inflammation. No glaucoma or cataracts.  ENT: No tinnitus. No ear pain. No hearing loss. No difficulty with swallowing.  RESPIRATORY: Denies any cough, wheezing. No hemoptysis. Has chronic dyspnea. No painful respiration. Has pulmonary hypertension.  CARDIOVASCULAR: Denies any chest pain. Denies any orthopnea or edema. Has history of atrial fibrillation. Has some dyspnea on exertion.  GASTROINTESTINAL: No nausea, vomiting, diarrhea. No abdominal pain. No hematemesis.  GENITOURINARY: Denies any dysuria, hematuria, renal calculus or frequency.  ENDOCRINE: Denies any polyuria, nocturia or thyroid problems.  HEMATOLOGIC AND LYMPHATIC: Denies any major bruisability or bleeding.  SKIN: No acne. No rash. No changes in mole, hair or skin.  MUSCULOSKELETAL: Denies any pain in the neck, back or shoulder.  NEURO: No numbness. No CVA. No TIA. Has a history of headache.  PSYCHIATRIC: No anxiety. No insomnia. No ADD.   PHYSICAL EXAMINATION:  VITAL SIGNS: Temperature 99.1. Pulse on presentation was 130, currently 94. Respirations 20. Blood pressure was 108/67.  GENERAL: The  patient is a well-nourished [place] female, currently not in any acute distress.  HEENT: Head atraumatic, normocephalic. Pupils equally round, reactive to light and accommodation. There is no conjunctival pallor. No scleral icterus. Nasal exam shows no drainage or ulceration. Oropharynx is clear without any exudate.  NECK: No thyromegaly. No carotid  bruits.  CARDIOVASCULAR: Regular rate and rhythm. There is a murmur at the left sternal border. PMI is not displaced.  LUNGS: Clear to auscultation bilaterally without any rales, rhonchi or wheezing.  ABDOMEN: Soft, nontender, nondistended. Positive bowel sounds x 4.  EXTREMITIES: No clubbing, cyanosis or edema.  SKIN: No rash.  LYMPHATICS: No lymph nodes palpable.  VASCULAR: Good DP, PT pulses.  PSYCHIATRIC: Not anxious or depressed.  NEUROLOGICAL: Awake, alert, oriented x 3. No focal deficits.   LABORATORY AND DIAGNOSTIC DATA: EKG showed A. fib with RVR, nonspecific ST-T wave changes. BMP: Glucose 118, BUN 14, creatinine 0.63, sodium 139, potassium 4.2, chloride 106, CO2 of 25, calcium 8.8. LFTs were normal. Troponin 0.08. WBC 6.0, hemoglobin 15.4, platelets 272,000. INR is 2.1.   ASSESSMENT AND PLAN: The patient is a 69 year old African American female with history of pulmonary hypertension, ventricular septal defect, history of atrial fibrillation, presents with palpitations.  1.  Atrial fibrillation with rapid ventricular response: Heart rate still intermittently over 100 due to her blood pressure dropping. We will monitor on telemetry. Hold Cardizem. Will continue sotalol and Coumadin. I have spoken to Dr. Mariah MillingGollan who will see the patient.  2.  Hypotension: Likely due to receiving IV Cardizem. Blood pressure is improved. I will hold her torsemide and angiotensin-converting enzyme inhibitor. Will continue low-dose intravenous fluids. The patient is at high risk of developing fluid overload, especially right heart failure. Will monitor her blood pressure.  3.  Ventricular tachycardia, 8 to 10 beats: Will monitor on telemetry. Follow cardiac enzymes. Have discussed with Dr. Mariah MillingGollan who will see the patient. Will also get echocardiogram of the heart.  4.  Pulmonary hypertension: Will continue her regimen of tadalafil and Tracleer.  5.  Elevated cardiac enzymes: Will follow cardiac enzymes. Likely  due to demand ischemia as a result of her atrial fibrillation with rapid ventricular response. Again, cardiology will see the patient.  6.  Miscellaneous: The patient is already on Coumadin for deep vein thrombosis prophylaxis.   TIME SPENT: 45 minutes.   ____________________________ Lacie ScottsShreyang H. Allena KatzPatel, MD shp:jm D: 02/02/2013 14:32:27 ET T: 02/02/2013 14:48:06 ET JOB#: [id number]  cc: Nyia Tsao H. Allena KatzPatel, MD, <Dictator> Charise CarwinSHREYANG H Leonila Speranza MD ELECTRONICALLY SIGNED 02/03/2013 11:30

## 2015-07-30 ENCOUNTER — Other Ambulatory Visit: Payer: Self-pay | Admitting: Family Medicine

## 2015-07-30 DIAGNOSIS — Z780 Asymptomatic menopausal state: Secondary | ICD-10-CM

## 2015-08-12 ENCOUNTER — Ambulatory Visit
Admission: RE | Admit: 2015-08-12 | Discharge: 2015-08-12 | Disposition: A | Discharge status: Home / Self Care | Payer: Medicare Other | Source: Ambulatory Visit | Attending: Family Medicine | Admitting: Family Medicine

## 2015-08-12 DIAGNOSIS — Z780 Asymptomatic menopausal state: Secondary | ICD-10-CM | POA: Diagnosis present

## 2015-08-12 DIAGNOSIS — Z13820 Encounter for screening for osteoporosis: Secondary | ICD-10-CM | POA: Insufficient documentation

## 2015-08-12 DIAGNOSIS — M8580 Other specified disorders of bone density and structure, unspecified site: Secondary | ICD-10-CM | POA: Insufficient documentation

## 2016-07-20 ENCOUNTER — Emergency Department: Payer: Medicare Other

## 2016-07-20 ENCOUNTER — Emergency Department
Admission: EM | Admit: 2016-07-20 | Discharge: 2016-07-20 | Disposition: A | Discharge status: Home / Self Care | Payer: Medicare Other | Attending: Emergency Medicine | Admitting: Emergency Medicine

## 2016-07-20 DIAGNOSIS — H1132 Conjunctival hemorrhage, left eye: Secondary | ICD-10-CM | POA: Diagnosis not present

## 2016-07-20 DIAGNOSIS — Q210 Ventricular septal defect: Secondary | ICD-10-CM | POA: Diagnosis not present

## 2016-07-20 DIAGNOSIS — I509 Heart failure, unspecified: Secondary | ICD-10-CM | POA: Insufficient documentation

## 2016-07-20 DIAGNOSIS — I672 Cerebral atherosclerosis: Secondary | ICD-10-CM | POA: Insufficient documentation

## 2016-07-20 DIAGNOSIS — Z7901 Long term (current) use of anticoagulants: Secondary | ICD-10-CM | POA: Diagnosis not present

## 2016-07-20 DIAGNOSIS — H109 Unspecified conjunctivitis: Secondary | ICD-10-CM | POA: Diagnosis present

## 2016-07-20 DIAGNOSIS — I110 Hypertensive heart disease with heart failure: Secondary | ICD-10-CM | POA: Diagnosis not present

## 2016-07-20 HISTORY — DX: Pulmonary hypertension, unspecified: I27.20

## 2016-07-20 HISTORY — DX: Ventricular septal defect: Q21.0

## 2016-07-20 HISTORY — DX: Essential (primary) hypertension: I10

## 2016-07-20 HISTORY — DX: Heart failure, unspecified: I50.9

## 2016-07-20 MED ORDER — NAPHAZOLINE HCL 0.1 % OP SOLN: 1.0000 [drp] | Freq: Four times a day (QID) | OPHTHALMIC | 0 refills | Status: DC | PRN

## 2016-07-20 NOTE — ED Provider Notes (Signed)
[facility] Emergency Department Provider Note   ____________________________________________   None    (approximate)  I have reviewed the triage vital signs and the nursing notes.   HISTORY  Chief Complaint Conjunctivitis    HPI Synethia Endicott is a 70 y.o. female patient complaining of left eye redness and a feeling of "uncomfortable" (. Patient denies any pain. Patient said I was normal yesterday. Patient awakened this morning with increased redness and edema. Patient denies any vision change. Patient denies any headaches associated with this complaint. Patient denies any vertigo.   Past Medical History:  Diagnosis Date  . CHF (congestive heart failure) (HCC)   . Hypertension   . Pulmonary hypertension (HCC)   . VSD (ventricular septal defect)     There are no active problems to display for this patient.   History reviewed. No pertinent surgical history.  Prior to Admission medications   Medication Sig Start Date End Date Taking? Authorizing Provider  ambrisentan (LETAIRIS) 5 MG tablet Take 5 mg by mouth daily.   Yes Historical Provider, MD  diazepam (VALIUM) 2 MG tablet Take 2 mg by mouth every 6 (six) hours as needed for anxiety.   Yes Historical Provider, MD  Fluticasone-Salmeterol (ADVAIR) 250-50 MCG/DOSE AEPB Inhale 1 puff into the lungs 2 (two) times daily.   Yes Historical Provider, MD  losartan (COZAAR) 50 MG tablet Take 50 mg by mouth daily.   Yes Historical Provider, MD  warfarin (COUMADIN) 6 MG tablet Take 6 mg by mouth daily. 6 mg for 6 days   Then 7 mg on day 7   Yes Historical Provider, MD    Allergies Penicillins; Sulfa antibiotics; and Tylenol [acetaminophen]  No family history on file.  Social History Social History  Substance Use Topics  . Smoking status: Never Smoker  . Smokeless tobacco: Never Used  . Alcohol use No    Review of Systems Constitutional: No fever/chills Eyes: No visual changes. ENT: No sore  throat. Cardiovascular: Denies chest pain. Respiratory: Denies shortness of breath. Gastrointestinal: No abdominal pain.  No nausea, no vomiting.  No diarrhea.  No constipation. Genitourinary: Negative for dysuria. Musculoskeletal: Negative for back pain. Skin: Negative for rash. Neurological: Negative for headaches, focal weakness or numbness. Endocrine:Hypertension ____________________________________________   PHYSICAL EXAM:  VITAL SIGNS: ED Triage Vitals [07/20/16 1206]  Enc Vitals Group     BP (!) 142/82     Pulse Rate 69     Resp 20     Temp 98.4 F (36.9 C)     Temp Source Oral     SpO2 91 %     Weight 182 lb (82.6 kg)     Height 5\' 6"  (1.676 m)     Head Circumference      Peak Flow      Pain Score      Pain Loc      Pain Edu?      Excl. in GC?     Constitutional: Alert and oriented. Well appearing and in no acute distress. Eyes:Left conjunctiva with hemorrhaging... PERRL. EOMI. Head: Atraumatic. Nose: No congestion/rhinnorhea. Mouth/Throat: Mucous membranes are moist.  Oropharynx non-erythematous. Neck: No stridor.  No cervical spine tenderness to palpation. Hematological/Lymphatic/Immunilogical: No cervical lymphadenopathy. Cardiovascular: Normal rate, regular rhythm. Grossly normal heart sounds.  Good peripheral circulation. Respiratory: Normal respiratory effort.  No retractions. Lungs CTAB. Gastrointestinal: Soft and nontender. No distention. No abdominal bruits. No CVA tenderness. Musculoskeletal: No lower extremity tenderness nor edema.  No joint effusions.  Neurologic:  Normal speech and language. No gross focal neurologic deficits are appreciated. No gait instability. Skin:  Skin is warm, dry and intact. No rash noted. Psychiatric: Mood and affect are normal. Speech and behavior are normal.  ____________________________________________   LABS (all labs ordered are listed, but only abnormal results are displayed)  Labs Reviewed - No data to  display ____________________________________________  EKG   ____________________________________________  RADIOLOGY   ____________________________________________   PROCEDURES  Procedure(s) performed: None  Procedures  Critical Care performed: No  ____________________________________________   INITIAL IMPRESSION / ASSESSMENT AND PLAN / ED COURSE  Pertinent labs & imaging results that were available during my care of the patient were reviewed by me and considered in my medical decision making (see chart for details).  Left conjunctiva hemorrhaging. Discussed negative CT findings with patient. Patient given discharge care instructions. [facility]  Clinical Course     ____________________________________________   FINAL CLINICAL IMPRESSION(S) / ED DIAGNOSES  Final diagnoses:  Conjunctival hemorrhage of left eye      NEW MEDICATIONS STARTED DURING THIS VISIT:  New Prescriptions   No medications on file     Note:  This document was prepared using Dragon voice recognition software and may include unintentional dictation errors.    Joni ReiningRonald K Danzel Marszalek, PA-C 07/20/16 1309    Jennye MoccasinBrian S Quigley, MD 07/20/16 [id number]

## 2016-07-20 NOTE — ED Notes (Signed)
Left eye pain since yesterday  Woke up with redness noted this am  Denies any trauma

## 2016-07-20 NOTE — ED Triage Notes (Signed)
Pt arrives to ER via POV c/o left eye redness and feeling "uncomfotable" in left eye. Denies pain when prompted. Pt states eye normal yesterday. Left eye is very reddened. Denies vision changes. Pt alert and oriented X4, active, cooperative, pt in NAD. RR even and unlabored, color WNL.

## 2016-11-11 ENCOUNTER — Emergency Department: Payer: Medicare Other

## 2016-11-11 ENCOUNTER — Encounter: Payer: Self-pay | Admitting: Emergency Medicine

## 2016-11-11 ENCOUNTER — Inpatient Hospital Stay
Admission: EM | Admit: 2016-11-11 | Discharge: 2016-11-13 | DRG: 871 | Disposition: A | Discharge status: Home / Self Care | Payer: Medicare Other | Attending: Internal Medicine | Admitting: Internal Medicine

## 2016-11-11 DIAGNOSIS — A419 Sepsis, unspecified organism: Secondary | ICD-10-CM | POA: Diagnosis not present

## 2016-11-11 DIAGNOSIS — Z880 Allergy status to penicillin: Secondary | ICD-10-CM

## 2016-11-11 DIAGNOSIS — R7989 Other specified abnormal findings of blood chemistry: Secondary | ICD-10-CM

## 2016-11-11 DIAGNOSIS — Z882 Allergy status to sulfonamides status: Secondary | ICD-10-CM

## 2016-11-11 DIAGNOSIS — I110 Hypertensive heart disease with heart failure: Secondary | ICD-10-CM | POA: Diagnosis present

## 2016-11-11 DIAGNOSIS — Q210 Ventricular septal defect: Secondary | ICD-10-CM

## 2016-11-11 DIAGNOSIS — Z7901 Long term (current) use of anticoagulants: Secondary | ICD-10-CM

## 2016-11-11 DIAGNOSIS — I4891 Unspecified atrial fibrillation: Secondary | ICD-10-CM | POA: Diagnosis present

## 2016-11-11 DIAGNOSIS — R778 Other specified abnormalities of plasma proteins: Secondary | ICD-10-CM

## 2016-11-11 DIAGNOSIS — Z886 Allergy status to analgesic agent status: Secondary | ICD-10-CM

## 2016-11-11 DIAGNOSIS — J189 Pneumonia, unspecified organism: Secondary | ICD-10-CM | POA: Diagnosis present

## 2016-11-11 DIAGNOSIS — R748 Abnormal levels of other serum enzymes: Secondary | ICD-10-CM | POA: Diagnosis not present

## 2016-11-11 DIAGNOSIS — I480 Paroxysmal atrial fibrillation: Secondary | ICD-10-CM | POA: Diagnosis present

## 2016-11-11 DIAGNOSIS — I10 Essential (primary) hypertension: Secondary | ICD-10-CM | POA: Diagnosis present

## 2016-11-11 DIAGNOSIS — F419 Anxiety disorder, unspecified: Secondary | ICD-10-CM | POA: Diagnosis present

## 2016-11-11 DIAGNOSIS — E785 Hyperlipidemia, unspecified: Secondary | ICD-10-CM | POA: Diagnosis present

## 2016-11-11 DIAGNOSIS — Z881 Allergy status to other antibiotic agents status: Secondary | ICD-10-CM

## 2016-11-11 DIAGNOSIS — Z7982 Long term (current) use of aspirin: Secondary | ICD-10-CM

## 2016-11-11 DIAGNOSIS — K219 Gastro-esophageal reflux disease without esophagitis: Secondary | ICD-10-CM | POA: Diagnosis present

## 2016-11-11 DIAGNOSIS — I5042 Chronic combined systolic (congestive) and diastolic (congestive) heart failure: Secondary | ICD-10-CM | POA: Diagnosis present

## 2016-11-11 DIAGNOSIS — I2783 Eisenmenger's syndrome: Secondary | ICD-10-CM | POA: Diagnosis present

## 2016-11-11 DIAGNOSIS — I248 Other forms of acute ischemic heart disease: Secondary | ICD-10-CM | POA: Diagnosis present

## 2016-11-11 HISTORY — DX: Unspecified atrial fibrillation: I48.91

## 2016-11-11 HISTORY — DX: Gastro-esophageal reflux disease without esophagitis: K21.9

## 2016-11-11 HISTORY — DX: Hyperlipidemia, unspecified: E78.5

## 2016-11-11 HISTORY — DX: Anxiety disorder, unspecified: F41.9

## 2016-11-11 LAB — BASIC METABOLIC PANEL
Anion gap: 7 (ref 5–15)
BUN: 9 mg/dL (ref 6–20)
CHLORIDE: 107 mmol/L (ref 101–111)
CO2: 22 mmol/L (ref 22–32)
CREATININE: 0.62 mg/dL (ref 0.44–1.00)
Calcium: 9.1 mg/dL (ref 8.9–10.3)
Glucose, Bld: 115 mg/dL — ABNORMAL HIGH (ref 65–99)
Potassium: 3.9 mmol/L (ref 3.5–5.1)
SODIUM: 136 mmol/L (ref 135–145)

## 2016-11-11 LAB — URINALYSIS, COMPLETE (UACMP) WITH MICROSCOPIC
Bilirubin Urine: NEGATIVE
GLUCOSE, UA: NEGATIVE mg/dL
Ketones, ur: NEGATIVE mg/dL
Leukocytes, UA: NEGATIVE
Nitrite: NEGATIVE
PH: 5 (ref 5.0–8.0)
Protein, ur: NEGATIVE mg/dL
SPECIFIC GRAVITY, URINE: 1.014 (ref 1.005–1.030)

## 2016-11-11 LAB — CBC WITH DIFFERENTIAL/PLATELET
BASOS PCT: 1 %
Basophils Absolute: 0.1 10*3/uL (ref 0–0.1)
Eosinophils Absolute: 0 10*3/uL (ref 0–0.7)
Eosinophils Relative: 0 %
HEMATOCRIT: 46.1 % (ref 35.0–47.0)
HEMOGLOBIN: 15.2 g/dL (ref 12.0–16.0)
LYMPHS ABS: 0.9 10*3/uL — AB (ref 1.0–3.6)
LYMPHS PCT: 8 %
MCH: 28.7 pg (ref 26.0–34.0)
MCHC: 32.9 g/dL (ref 32.0–36.0)
MCV: 87 fL (ref 80.0–100.0)
MONO ABS: 1 10*3/uL — AB (ref 0.2–0.9)
MONOS PCT: 9 %
NEUTROS ABS: 9.7 10*3/uL — AB (ref 1.4–6.5)
NEUTROS PCT: 82 %
Platelets: 202 10*3/uL (ref 150–440)
RBC: 5.29 MIL/uL — ABNORMAL HIGH (ref 3.80–5.20)
RDW: 17.1 % — ABNORMAL HIGH (ref 11.5–14.5)
WBC: 11.7 10*3/uL — ABNORMAL HIGH (ref 3.6–11.0)

## 2016-11-11 LAB — TROPONIN I: TROPONIN I: 0.79 ng/mL — AB (ref <0.03)

## 2016-11-11 LAB — BRAIN NATRIURETIC PEPTIDE: B Natriuretic Peptide: 185 pg/mL — ABNORMAL HIGH (ref 0.0–100.0)

## 2016-11-11 MED ORDER — ASPIRIN 81 MG PO CHEW
324.0000 mg | CHEWABLE_TABLET | Freq: Once | ORAL | Status: AC
  Administered 2016-11-11: 324 mg via ORAL
  Filled 2016-11-11: qty 4

## 2016-11-11 MED ORDER — LEVOFLOXACIN IN D5W 750 MG/150ML IV SOLN
750.0000 mg | Freq: Once | INTRAVENOUS | Status: AC
  Administered 2016-11-11: 750 mg via INTRAVENOUS
  Filled 2016-11-11: qty 150

## 2016-11-11 NOTE — ED Notes (Addendum)
Charge nurse notified of troponin; room assigned; pt taken to room 1 via w/c and placed in hosp gown and on card monitor; report given to care nurse Erie NoeVanessa, RN

## 2016-11-11 NOTE — ED Triage Notes (Addendum)
Pt to ed with c/o sob, cough, fever and burning in chest x 3 days. Increased SOB with ambulation or activity.  [facility]  Per pt her sats are always low, reports they are never higher than 94% due to ventricular septal defect. Pt appears in no acute resp distress while sitting in chair at triage.

## 2016-11-11 NOTE — ED Notes (Addendum)
Reviewed pt's chart and results; orders entered, will recheck vs; Patient ambulatory to triage with steady gait, without difficulty or distress noted; pt updated on plan of care and wait time; voices good understanding; reviewed CXR with Dr Darnelle CatalanMalinda and labs ordered; pt's oxim 88% on ra; st normally around 90% and uses O2 at 4l/min via [place] at night; O2 placed at 3l/min via [place] to bring sat to 92%

## 2016-11-11 NOTE — ED Provider Notes (Addendum)
[facility] Emergency Department Provider Note   ____________________________________________   First MD Initiated Contact with Patient 11/11/16 2223     (approximate)  I have reviewed the triage vital signs and the nursing notes.   HISTORY  Chief Complaint Shortness of Breath and Fever    HPI Haley Adams is a 70 y.o. female patient is peeling been feeling poorly for the last few days. Today at about 1:00 she got worse. She's been coughing up yellow phlegm and possibly some blood in it. Patient's been running a fever of 101 and slightly higher. Patient did more short of breath than usual. Patient actually has had burning pain in her chest for last 3 days. At present she is not having any chest pain but she is short of breath. Patient required more oxygen than usual to keep her oxygen saturations up.   Past Medical History:  Diagnosis Date  . CHF (congestive heart failure) (HCC)   . Hypertension   . Pulmonary hypertension   . VSD (ventricular septal defect)     There are no active problems to display for this patient.   History reviewed. No pertinent surgical history.  Prior to Admission medications   Medication Sig Start Date End Date Taking? Authorizing Provider  ambrisentan (LETAIRIS) 5 MG tablet Take 5 mg by mouth daily.    Historical Provider, MD  diazepam (VALIUM) 2 MG tablet Take 2 mg by mouth every 6 (six) hours as needed for anxiety.    Historical Provider, MD  Fluticasone-Salmeterol (ADVAIR) 250-50 MCG/DOSE AEPB Inhale 1 puff into the lungs 2 (two) times daily.    Historical Provider, MD  losartan (COZAAR) 50 MG tablet Take 50 mg by mouth daily.    Historical Provider, MD  naphazoline (NAPHCON) 0.1 % ophthalmic solution Place 1 drop into the left eye 4 (four) times daily as needed for irritation. 07/20/16   Joni Reiningonald K Smith, PA-C  warfarin (COUMADIN) 6 MG tablet Take 6 mg by mouth daily. 6 mg for 6 days   Then 7 mg on day 7    Historical  Provider, MD    Allergies Penicillins; Sulfa antibiotics; and Tylenol [acetaminophen]  No family history on file.  Social History Social History  Substance Use Topics  . Smoking status: Never Smoker  . Smokeless tobacco: Never Used  . Alcohol use No    Review of Systems Constitutional:fever/chills Eyes: No visual changes. ENT: No sore throat. Cardiovascular:  chest pain. Respiratory:  shortness of breath. Gastrointestinal: No abdominal pain.  No nausea, no vomiting.  No diarrhea.  No constipation. Genitourinary: Negative for dysuria. Musculoskeletal: Negative for back pain. Skin: Negative for rash. Neurological: Negative for headaches, focal weakness or numbness.  10-point ROS otherwise negative.  ____________________________________________   PHYSICAL EXAM:  VITAL SIGNS: ED Triage Vitals [11/11/16 1713]  Enc Vitals Group     BP (!) 155/76     Pulse Rate 95     Resp 20     Temp (!) 101.1 F (38.4 C)     Temp Source Oral     SpO2 93 %     Weight 182 lb (82.6 kg)     Height      Head Circumference      Peak Flow      Pain Score 1     Pain Loc      Pain Edu?      Excl. in GC?     Constitutional: Alert and oriented. Well appearing and in  no acute distress. Eyes: Conjunctivae are normal. PERRL. EOMI. Head: Atraumatic. Nose: No congestion/rhinnorhea. Mouth/Throat: Mucous membranes are moist.  Oropharynx non-erythematous. Neck: No stridor.  Cardiovascular: Normal rate, regular rhythm. Grossly normal heart sounds.  Good peripheral circulation. Respiratory: Normal respiratory effort.  No retractions. Lungs scattered crackles Gastrointestinal: Soft and nontender. No distention. No abdominal bruits. No CVA tenderness. Extremities: No pain there is trace edema in the legs. ____________________________________________   LABS (all labs ordered are listed, but only abnormal results are displayed)  Labs Reviewed  BASIC METABOLIC PANEL - Abnormal; Notable for the  following:       Result Value   Glucose, Bld 115 (*)    All other components within normal limits  TROPONIN I - Abnormal; Notable for the following:    Troponin I 0.79 (*)    All other components within normal limits  URINALYSIS, COMPLETE (UACMP) WITH MICROSCOPIC - Abnormal; Notable for the following:    Color, Urine YELLOW (*)    APPearance CLEAR (*)    Hgb urine dipstick SMALL (*)    Bacteria, UA RARE (*)    Squamous Epithelial / LPF 0-5 (*)    All other components within normal limits  CBC WITH DIFFERENTIAL/PLATELET - Abnormal; Notable for the following:    WBC 11.7 (*)    RBC 5.29 (*)    RDW 17.1 (*)    Neutro Abs 9.7 (*)    Lymphs Abs 0.9 (*)    Monocytes Absolute 1.0 (*)    All other components within normal limits  BRAIN NATRIURETIC PEPTIDE - Abnormal; Notable for the following:    B Natriuretic Peptide 185.0 (*)    All other components within normal limits  CULTURE, BLOOD (ROUTINE X 2)  CULTURE, BLOOD (ROUTINE X 2)   ____________________________________________  EKG  EKG read and interpreted by me shows normal sinus rhythm rate of 95 normal axis there is T-wave inversions inferiorly and in the anterior chest leads V1 through 3. These are not as pronounced as previously. ____________________________________________  RADIOLOGY  Study Result   CLINICAL DATA:  Productive cough since yesterday, shortness of breath.  EXAM: CHEST  2 VIEW  COMPARISON:  02/02/2013  FINDINGS: Cardiomegaly with vascular congestion. No overt edema or confluent airspace opacity. No effusion or acute bony abnormality.  IMPRESSION: Cardiomegaly with vascular congestion.   Electronically Signed   By: Charlett NoseKevin  Dover M.D.   On: 11/11/2016 18:12    ____________________________________________   PROCEDURES  Procedure(s) performed:   Procedures  Critical Care performed:   ____________________________________________   INITIAL IMPRESSION / ASSESSMENT AND PLAN / ED  COURSE  Pertinent labs & imaging results that were available during my care of the patient were reviewed by me and considered in my medical decision making (see chart for details).    Clinical Course      ____________________________________________   FINAL CLINICAL IMPRESSION(S) / ED DIAGNOSES  Final diagnoses:  Community acquired pneumonia, unspecified laterality  Elevated troponin      NEW MEDICATIONS STARTED DURING THIS VISIT:  New Prescriptions   No medications on file     Note:  This document was prepared using Dragon voice recognition software and may include unintentional dictation errors.    Arnaldo NatalPaul F Malinda, MD 11/11/16 [id number]    Arnaldo NatalPaul F Malinda, MD 11/11/16 [phone]

## 2016-11-12 ENCOUNTER — Encounter: Payer: Self-pay | Admitting: Internal Medicine

## 2016-11-12 ENCOUNTER — Inpatient Hospital Stay
Admit: 2016-11-12 | Discharge: 2016-11-12 | Disposition: A | Discharge status: Home / Self Care | Payer: Medicare Other | Attending: Internal Medicine | Admitting: Internal Medicine

## 2016-11-12 DIAGNOSIS — Z7982 Long term (current) use of aspirin: Secondary | ICD-10-CM | POA: Diagnosis not present

## 2016-11-12 DIAGNOSIS — I5042 Chronic combined systolic (congestive) and diastolic (congestive) heart failure: Secondary | ICD-10-CM | POA: Diagnosis present

## 2016-11-12 DIAGNOSIS — Z882 Allergy status to sulfonamides status: Secondary | ICD-10-CM | POA: Diagnosis not present

## 2016-11-12 DIAGNOSIS — J189 Pneumonia, unspecified organism: Secondary | ICD-10-CM | POA: Diagnosis present

## 2016-11-12 DIAGNOSIS — Z880 Allergy status to penicillin: Secondary | ICD-10-CM | POA: Diagnosis not present

## 2016-11-12 DIAGNOSIS — K219 Gastro-esophageal reflux disease without esophagitis: Secondary | ICD-10-CM | POA: Diagnosis present

## 2016-11-12 DIAGNOSIS — Q210 Ventricular septal defect: Secondary | ICD-10-CM | POA: Diagnosis not present

## 2016-11-12 DIAGNOSIS — E785 Hyperlipidemia, unspecified: Secondary | ICD-10-CM | POA: Diagnosis present

## 2016-11-12 DIAGNOSIS — I4891 Unspecified atrial fibrillation: Secondary | ICD-10-CM | POA: Diagnosis present

## 2016-11-12 DIAGNOSIS — I248 Other forms of acute ischemic heart disease: Secondary | ICD-10-CM | POA: Diagnosis present

## 2016-11-12 DIAGNOSIS — R748 Abnormal levels of other serum enzymes: Secondary | ICD-10-CM | POA: Diagnosis present

## 2016-11-12 DIAGNOSIS — F419 Anxiety disorder, unspecified: Secondary | ICD-10-CM | POA: Diagnosis present

## 2016-11-12 DIAGNOSIS — Z886 Allergy status to analgesic agent status: Secondary | ICD-10-CM | POA: Diagnosis not present

## 2016-11-12 DIAGNOSIS — Z881 Allergy status to other antibiotic agents status: Secondary | ICD-10-CM | POA: Diagnosis not present

## 2016-11-12 DIAGNOSIS — A419 Sepsis, unspecified organism: Secondary | ICD-10-CM | POA: Diagnosis present

## 2016-11-12 DIAGNOSIS — Z7901 Long term (current) use of anticoagulants: Secondary | ICD-10-CM | POA: Diagnosis not present

## 2016-11-12 DIAGNOSIS — I10 Essential (primary) hypertension: Secondary | ICD-10-CM | POA: Diagnosis present

## 2016-11-12 DIAGNOSIS — R7989 Other specified abnormal findings of blood chemistry: Secondary | ICD-10-CM

## 2016-11-12 DIAGNOSIS — I2783 Eisenmenger's syndrome: Secondary | ICD-10-CM | POA: Diagnosis present

## 2016-11-12 DIAGNOSIS — R778 Other specified abnormalities of plasma proteins: Secondary | ICD-10-CM | POA: Diagnosis present

## 2016-11-12 DIAGNOSIS — I110 Hypertensive heart disease with heart failure: Secondary | ICD-10-CM | POA: Diagnosis present

## 2016-11-12 DIAGNOSIS — I480 Paroxysmal atrial fibrillation: Secondary | ICD-10-CM | POA: Diagnosis present

## 2016-11-12 LAB — CBC
HEMATOCRIT: 43.9 % (ref 35.0–47.0)
HEMOGLOBIN: 14.5 g/dL (ref 12.0–16.0)
MCH: 29 pg (ref 26.0–34.0)
MCHC: 33.1 g/dL (ref 32.0–36.0)
MCV: 87.7 fL (ref 80.0–100.0)
Platelets: 184 10*3/uL (ref 150–440)
RBC: 5.01 MIL/uL (ref 3.80–5.20)
RDW: 16.9 % — AB (ref 11.5–14.5)
WBC: 13.2 10*3/uL — AB (ref 3.6–11.0)

## 2016-11-12 LAB — BASIC METABOLIC PANEL
ANION GAP: 6 (ref 5–15)
BUN: 9 mg/dL (ref 6–20)
CHLORIDE: 108 mmol/L (ref 101–111)
CO2: 23 mmol/L (ref 22–32)
Calcium: 8.7 mg/dL — ABNORMAL LOW (ref 8.9–10.3)
Creatinine, Ser: 0.66 mg/dL (ref 0.44–1.00)
GFR calc Af Amer: >60 mL/min (ref >60)
GFR calc non Af Amer: >60 mL/min (ref >60)
Glucose, Bld: 102 mg/dL — ABNORMAL HIGH (ref 65–99)
POTASSIUM: 4.2 mmol/L (ref 3.5–5.1)
SODIUM: 137 mmol/L (ref 135–145)

## 2016-11-12 LAB — EXPECTORATED SPUTUM ASSESSMENT W GRAM STAIN, RFLX TO RESP C

## 2016-11-12 LAB — INFLUENZA PANEL BY PCR (TYPE A & B)
Influenza A By PCR: NEGATIVE
Influenza B By PCR: NEGATIVE

## 2016-11-12 LAB — PROTIME-INR
INR: 1.79
PROTHROMBIN TIME: 21 s — AB (ref 11.4–15.2)

## 2016-11-12 LAB — TROPONIN I
TROPONIN I: 1.09 ng/mL — AB (ref <0.03)
TROPONIN I: 0.71 ng/mL — AB (ref <0.03)
Troponin I: 1.59 ng/mL (ref <0.03)

## 2016-11-12 LAB — ECHOCARDIOGRAM COMPLETE
HEIGHTINCHES: 65.5 in
Weight: 2894.4 oz

## 2016-11-12 LAB — EXPECTORATED SPUTUM ASSESSMENT W REFEX TO RESP CULTURE

## 2016-11-12 MED ORDER — ENOXAPARIN SODIUM 40 MG/0.4ML ~~LOC~~ SOLN
40.0000 mg | SUBCUTANEOUS | Status: DC
  Administered 2016-11-12: 40 mg via SUBCUTANEOUS
  Filled 2016-11-12: qty 0.4

## 2016-11-12 MED ORDER — ONDANSETRON HCL 4 MG PO TABS: 4.0000 mg | ORAL_TABLET | Freq: Four times a day (QID) | ORAL | Status: DC | PRN

## 2016-11-12 MED ORDER — ONDANSETRON HCL 4 MG/2ML IJ SOLN: 4.0000 mg | Freq: Four times a day (QID) | INTRAMUSCULAR | Status: DC | PRN

## 2016-11-12 MED ORDER — ASPIRIN EC 81 MG PO TBEC
81.0000 mg | DELAYED_RELEASE_TABLET | Freq: Every day | ORAL | Status: DC
  Administered 2016-11-12 – 2016-11-13 (×2): 81 mg via ORAL
  Filled 2016-11-12 (×2): qty 1

## 2016-11-12 MED ORDER — TORSEMIDE 10 MG PO TABS
10.0000 mg | ORAL_TABLET | Freq: Every day | ORAL | Status: DC
  Filled 2016-11-12 (×2): qty 1

## 2016-11-12 MED ORDER — DIAZEPAM 2 MG PO TABS: 2.0000 mg | ORAL_TABLET | Freq: Four times a day (QID) | ORAL | Status: DC | PRN

## 2016-11-12 MED ORDER — SOTALOL HCL 120 MG PO TABS
120.0000 mg | ORAL_TABLET | Freq: Two times a day (BID) | ORAL | Status: DC
  Administered 2016-11-12 – 2016-11-13 (×3): 120 mg via ORAL
  Filled 2016-11-12 (×5): qty 1

## 2016-11-12 MED ORDER — SODIUM CHLORIDE 0.9% FLUSH
3.0000 mL | Freq: Two times a day (BID) | INTRAVENOUS | Status: DC
  Administered 2016-11-12 – 2016-11-13 (×4): 3 mL via INTRAVENOUS

## 2016-11-12 MED ORDER — CEFTRIAXONE SODIUM-DEXTROSE 1-3.74 GM-% IV SOLR
1.0000 g | INTRAVENOUS | Status: DC
  Administered 2016-11-13: 1 g via INTRAVENOUS
  Filled 2016-11-12: qty 50

## 2016-11-12 MED ORDER — AZITHROMYCIN 500 MG IV SOLR
500.0000 mg | INTRAVENOUS | Status: DC
  Administered 2016-11-12: 500 mg via INTRAVENOUS
  Filled 2016-11-12 (×2): qty 500

## 2016-11-12 MED ORDER — AMBRISENTAN 5 MG PO TABS
5.0000 mg | ORAL_TABLET | Freq: Every day | ORAL | Status: DC
  Administered 2016-11-12: 5 mg via ORAL
  Filled 2016-11-12 (×3): qty 1

## 2016-11-12 MED ORDER — LOSARTAN POTASSIUM 50 MG PO TABS
50.0000 mg | ORAL_TABLET | Freq: Every day | ORAL | Status: DC
  Administered 2016-11-12 – 2016-11-13 (×2): 50 mg via ORAL
  Filled 2016-11-12 (×2): qty 1

## 2016-11-12 MED ORDER — BENZONATATE 100 MG PO CAPS: 200.0000 mg | ORAL_CAPSULE | Freq: Three times a day (TID) | ORAL | Status: DC | PRN

## 2016-11-12 MED ORDER — CEFTRIAXONE SODIUM-DEXTROSE 1-3.74 GM-% IV SOLR
1.0000 g | Freq: Once | INTRAVENOUS | Status: AC
  Administered 2016-11-12: 1 g via INTRAVENOUS
  Filled 2016-11-12: qty 50

## 2016-11-12 MED ORDER — WARFARIN SODIUM 5 MG PO TABS
6.0000 mg | ORAL_TABLET | Freq: Every day | ORAL | Status: DC
  Administered 2016-11-12: 19:00:00 6 mg via ORAL
  Filled 2016-11-12: qty 1

## 2016-11-12 MED ORDER — GUAIFENESIN-DM 100-10 MG/5ML PO SYRP: 5.0000 mL | ORAL_SOLUTION | ORAL | Status: DC | PRN

## 2016-11-12 NOTE — Progress Notes (Signed)
ANTICOAGULATION CONSULT NOTE - Initial Consult  Pharmacy Consult for warfarin dosing Indication: atrial fibrillation  Allergies  Allergen Reactions  . Penicillins Other (See Comments)    Has patient had a PCN reaction causing immediate rash, facial/tongue/throat swelling, SOB or lightheadedness with hypotension: no Has patient had a PCN reaction causing severe rash involving mucus membranes or skin necrosis: no Has patient had a PCN reaction that required hospitalization no Has patient had a PCN reaction occurring within the last 10 years: no If all of the above answers are "NO", then may proceed with Cephalosporin use.  Fever 105   . Sulfa Antibiotics   . Tylenol [Acetaminophen]     Patient Measurements: Height: 5' 5.5" (166.4 cm) Weight: 180 lb 14.4 oz (82.1 kg) IBW/kg (Calculated) : 58.15 Heparin Dosing Weight: n/a  Vital Signs: Temp: 99.1 F (37.3 C) (12/28 0248) Temp Source: Oral (12/28 0248) BP: 131/60 (12/28 0248) Pulse Rate: 72 (12/28 0248)  Labs:  Recent Labs  11/11/16 2058 11/11/16 2102 11/12/16 0302  HGB 15.2  --  14.5  HCT 46.1  --  43.9  PLT 202  --  184  LABPROT  --  21.0*  --   INR  --  1.79  --   CREATININE 0.62  --  0.66  TROPONINI 0.79*  --  1.59*    Estimated Creatinine Clearance: 71 mL/min (by C-G formula based on SCr of 0.66 mg/dL).   Medical History: Past Medical History:  Diagnosis Date  . Anxiety   . Atrial fibrillation (HCC)   . CHF (congestive heart failure) (HCC)   . GERD (gastroesophageal reflux disease)   . HLD (hyperlipidemia)   . Hypertension   . Pulmonary hypertension   . VSD (ventricular septal defect)     Medications:  Home regimen is 6 mg daily x 6 days with 7 mg on the 7th day.  Assessment: INR subtherapeutic on admission  Goal of Therapy:  INR 2-3    Plan:  Continue 6 mg daily. INR daily while on antibiotics.  Marjarie Irion S 11/12/2016,6:24 AM

## 2016-11-12 NOTE — H&P (Signed)
[facility] [place] [facility]   PATIENT NAME: Haley ChesterCynthia Adams    MR#:  [id number]  DATE OF BIRTH:  [birth year]  DATE OF ADMISSION:  11/11/2016  PRIMARY CARE PHYSICIAN: [facility]   REQUESTING/REFERRING PHYSICIAN: Darnelle CatalanMalinda, MD  CHIEF COMPLAINT:   Chief Complaint  Patient presents with  . Shortness of Breath  . Fever    HISTORY OF PRESENT ILLNESS:  Haley Adams  is a 70 y.o. female who presents with Fever and productive cough. Patient states that she's been having some "rattling" in her chest for the past couple of days. Today she spiked a fever and developed significant sputum production, yellowish color. Here in the ED she was found to be febrile, with an elevated white blood cell count. Chest x-ray did not show any focal pneumonia, but given her clinical picture this was felt to be the most likely cause of her sepsis. Hospitalists were called for admission and further evaluation and treatment.  PAST MEDICAL HISTORY:   Past Medical History:  Diagnosis Date  . Anxiety   . Atrial fibrillation (HCC)   . CHF (congestive heart failure) (HCC)   . GERD (gastroesophageal reflux disease)   . HLD (hyperlipidemia)   . Hypertension   . Pulmonary hypertension   . VSD (ventricular septal defect)     PAST SURGICAL HISTORY:   Past Surgical History:  Procedure Laterality Date  . CARDIAC CATHETERIZATION    . EYE SURGERY    . TOTAL ABDOMINAL HYSTERECTOMY W/ BILATERAL SALPINGOOPHORECTOMY      SOCIAL HISTORY:   Social History  Substance Use Topics  . Smoking status: Never Smoker  . Smokeless tobacco: Never Used  . Alcohol use No    FAMILY HISTORY:   Family History  Problem Relation Age of Onset  . Parkinsonism Mother   . COPD Father   . Cancer Brother   . Aneurysm Brother     DRUG ALLERGIES:   Allergies  Allergen Reactions  . Penicillins Other (See Comments)    Has patient had a PCN reaction causing immediate rash,  facial/tongue/throat swelling, SOB or lightheadedness with hypotension: no Has patient had a PCN reaction causing severe rash involving mucus membranes or skin necrosis: no Has patient had a PCN reaction that required hospitalization no Has patient had a PCN reaction occurring within the last 10 years: no If all of the above answers are "NO", then may proceed with Cephalosporin use.  Fever 105   . Sulfa Antibiotics   . Tylenol [Acetaminophen]     MEDICATIONS AT HOME:   Prior to Admission medications   Medication Sig Start Date End Date Taking? Authorizing Provider  ambrisentan (LETAIRIS) 5 MG tablet Take 5 mg by mouth daily.   Yes Historical Provider, MD  aspirin EC 81 MG tablet Take 81 mg by mouth daily.   Yes Historical Provider, MD  diazepam (VALIUM) 2 MG tablet Take 2 mg by mouth every 6 (six) hours as needed for anxiety.   Yes Historical Provider, MD  losartan (COZAAR) 50 MG tablet Take 50 mg by mouth daily.   Yes Historical Provider, MD  sotalol (BETAPACE) 120 MG tablet Take 120 mg by mouth 2 (two) times daily.   Yes Historical Provider, MD  torsemide (DEMADEX) 10 MG tablet Take 10 mg by mouth as needed.   Yes Historical Provider, MD  warfarin (COUMADIN) 6 MG tablet Take 6 mg by mouth daily. 6 mg for 6 days   Then 7 mg on  day 7   Yes Historical Provider, MD    REVIEW OF SYSTEMS:  Review of Systems  Constitutional: Positive for fever and malaise/fatigue. Negative for chills and weight loss.  HENT: Negative for ear pain, hearing loss and tinnitus.   Eyes: Negative for blurred vision, double vision, pain and redness.  Respiratory: Positive for cough and sputum production. Negative for hemoptysis and shortness of breath.   Cardiovascular: Negative for chest pain, palpitations, orthopnea and leg swelling.  Gastrointestinal: Negative for abdominal pain, constipation, diarrhea, nausea and vomiting.  Genitourinary: Negative for dysuria, frequency and hematuria.  Musculoskeletal:  Negative for back pain, joint pain and neck pain.  Skin:       No acne, rash, or lesions  Neurological: Negative for dizziness, tremors, focal weakness and weakness.  Endo/Heme/Allergies: Negative for polydipsia. Does not bruise/bleed easily.  Psychiatric/Behavioral: Negative for depression. The patient is not nervous/anxious and does not have insomnia.      VITAL SIGNS:   Vitals:   11/11/16 2300 11/11/16 2330 11/12/16 0000 11/12/16 0030  BP: (!) 127/97 (!) 138/59 (!) 132/59 125/60  Pulse:   81 77  Resp: (!) 23 (!) 26 (!) 24 (!) 27  Temp:      TempSrc:      SpO2:   (!) 89% 91%  Weight:       Wt Readings from Last 3 Encounters:  11/11/16 82.6 kg (182 lb)  07/20/16 82.6 kg (182 lb)    PHYSICAL EXAMINATION:  Physical Exam  Vitals reviewed. Constitutional: She is oriented to person, place, and time. She appears well-developed and well-nourished. No distress.  HENT:  Head: Normocephalic and atraumatic.  Mouth/Throat: Oropharynx is clear and moist.  Eyes: Conjunctivae and EOM are normal. Pupils are equal, round, and reactive to light. No scleral icterus.  Neck: Normal range of motion. Neck supple. No JVD present. No thyromegaly present.  Cardiovascular: Normal rate, regular rhythm and intact distal pulses.  Exam reveals no gallop and no friction rub.   No murmur heard. Respiratory: Effort normal. No respiratory distress. She has no wheezes. She has no rales.  BL anterior ronchi  GI: Soft. Bowel sounds are normal. She exhibits no distension. There is no tenderness.  Musculoskeletal: Normal range of motion. She exhibits no edema.  No arthritis, no gout  Lymphadenopathy:    She has no cervical adenopathy.  Neurological: She is alert and oriented to person, place, and time. No cranial nerve deficit.  No dysarthria, no aphasia  Skin: Skin is warm and dry. No rash noted. No erythema.  Psychiatric: She has a normal mood and affect. Her behavior is normal. Judgment and thought content  normal.    LABORATORY PANEL:   CBC  Recent Labs Lab 11/11/16 2058  WBC 11.7*  HGB 15.2  HCT 46.1  PLT 202   ------------------------------------------------------------------------------------------------------------------  Chemistries   Recent Labs Lab 11/11/16 2058  NA 136  K 3.9  CL 107  CO2 22  GLUCOSE 115*  BUN 9  CREATININE 0.62  CALCIUM 9.1   ------------------------------------------------------------------------------------------------------------------  Cardiac Enzymes  Recent Labs Lab 11/11/16 2058  TROPONINI 0.79*   ------------------------------------------------------------------------------------------------------------------  RADIOLOGY:  Dg Chest 2 View  Result Date: 11/11/2016 CLINICAL DATA:  Productive cough since yesterday, shortness of breath. EXAM: CHEST  2 VIEW COMPARISON:  02/02/2013 FINDINGS: Cardiomegaly with vascular congestion. No overt edema or confluent airspace opacity. No effusion or acute bony abnormality. IMPRESSION: Cardiomegaly with vascular congestion. Electronically Signed   By: Charlett Nose M.D.   On: 11/11/2016  18:12    EKG:   Orders placed or performed during the hospital encounter of 11/11/16  . EKG 12-Lead  . EKG 12-Lead  . ED EKG  . ED EKG    IMPRESSION AND PLAN:  Principal Problem:   Sepsis (HCC) - IV antibiotics initiated in the ED, we will continue IV antibiotics on admission. Blood cultures were sent from the ED. Sputum culture ordered. Sepsis is from pneumonia as below. Patient is hemodynamically stable. Active Problems:   CAP (community acquired pneumonia) - antibiotics and cultures as above   Elevated troponin - suspect this is likely due to heart strain in the setting of her heart failure due to the stress of her sepsis. However, we will trend her enzymes tonight   HTN (hypertension) - continue home meds   Anxiety - continue home meds   Chronic combined systolic and diastolic CHF (congestive heart  failure) (HCC) - continue home meds   Atrial fibrillation (HCC) - continue home meds including anticoagulation   HLD (hyperlipidemia) - continue home meds    All the records are reviewed and case discussed with ED provider. Management plans discussed with the patient and/or family.  DVT PROPHYLAXIS: SubQ lovenox  GI PROPHYLAXIS: None  ADMISSION STATUS: Inpatient  CODE STATUS: Full Code Status History    This patient does not have a recorded code status. Please follow your organizational policy for patients in this situation.      TOTAL TIME TAKING CARE OF THIS PATIENT: 45 minutes.    Miyuki Rzasa FIELDING 11/12/2016, 12:58 AM  [facility]  Office  [phone]  CC: [facility] physician; [facility]

## 2016-11-12 NOTE — Progress Notes (Signed)
*  PRELIMINARY RESULTS* Echocardiogram 2D Echocardiogram has been performed.  Haley BlueHege, Haley Adams 11/12/2016, 2:46 PM

## 2016-11-12 NOTE — Consult Note (Signed)
[facility] CARDIOLOGY [facility]  CARDIOLOGY CONSULT NOTE  Patient ID: Haley Adams MRN: [id number] DOB/AGE: 1946/02/01 69 y.o.  Admit date: 11/11/2016 Referring Physician Dr. Karlene Lineman Primary Physician Dr. Shaune Pollack Primary Cardiologist Dr. 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 She has a history of Eisenmenger's syndrome. She also has a history of paroxysmal atrial fibrillation currently treated with sotalol and anticoagulated with warfarin. She is currently on Letairis for her pulmonary hypertension. She was recently seen by her family cardiologist last week. He was fairly stable at the time. She takes torsemide on a when necessary basis due to the fact that it causes headaches. She uses it when she feels like she is volume overloaded. She presented to our emergency room after initially going to acute care because she noted fevers of up to 100 at home with a cough productive of sputum. She had some chest fullness and shortness of breath associated with this. In the emergency room she was noted to have a mildly elevated serum troponin. She denied exertional chest discomfort. She was febrile to 101 and had an elevated white blood cell count. Chest x-ray suggested cardiomegaly with vascular congestion. She has improved with diuresis as well as IV antibiotics and oxygen. Brain natruretic peptide was 185. Serum creatinine was 0.62 her INR was 1.79. Her flu panel was negative. Echocardiogram is pending. Echocardiogram at [facility] in January of this year revealed an ejection fraction of 59% with right ventricular systolic pressure at 79 mmHg. Left atrial diameter was 4.7. She had a large muscular VSD with bidirectional flow with the peak velocity of 2.0 m/s. with  moderately dilated artery and mild TR. RV systolic function appeared normal. She denies orthopnea or PND. She denies syncope.  Review of Systems  Constitutional: Positive for chills, fever and malaise/fatigue.  HENT: Negative.   Eyes: Negative.   Respiratory: Positive for cough, sputum production, shortness of breath and wheezing.   Cardiovascular: Negative for leg swelling.  Gastrointestinal: Negative.   Genitourinary: Negative.   Musculoskeletal: Negative.   Skin: Negative.   Neurological: Positive for weakness.  Endo/Heme/Allergies: Negative.   Psychiatric/Behavioral: Negative.     Past Medical History:  Diagnosis Date  . Anxiety   . Atrial fibrillation (HCC)   . CHF (congestive heart failure) (HCC)   . GERD (gastroesophageal reflux disease)   . HLD (hyperlipidemia)   . Hypertension   . Pulmonary hypertension   . VSD (ventricular septal defect)     Family History  Problem Relation Age of Onset  . Parkinsonism Mother   . COPD Father   . Cancer Brother   . Aneurysm Brother     Social History   Social History  . Marital status: Married    Spouse name: N/A  . Number of children: N/A  . Years of education: N/A   Occupational History  . Not on file.   Social History Main Topics  . Smoking status: Never Smoker  . Smokeless tobacco: Never Used  . Alcohol use No  . Drug use: No  . Sexual activity: Not on file   Other Topics Concern  . Not on file   Social History Narrative  . No narrative on file    Past Surgical History:  Procedure Laterality Date  . CARDIAC CATHETERIZATION    . EYE  SURGERY    . TOTAL ABDOMINAL HYSTERECTOMY W/ BILATERAL SALPINGOOPHORECTOMY       Prescriptions Prior to Admission  Medication Sig Dispense Refill Last Dose  . ambrisentan (LETAIRIS) 5 MG tablet Take 5 mg by mouth daily.   11/11/2016 at Unknown time  . aspirin EC 81 MG tablet Take 81 mg by mouth daily.   11/11/2016 at Unknown time  . diazepam (VALIUM) 2 MG tablet Take 2 mg  by mouth every 6 (six) hours as needed for anxiety.   prn  . losartan (COZAAR) 50 MG tablet Take 50 mg by mouth daily.   11/11/2016 at Unknown time  . sotalol (BETAPACE) 120 MG tablet Take 120 mg by mouth 2 (two) times daily.   11/11/2016 at Unknown time  . torsemide (DEMADEX) 10 MG tablet Take 10 mg by mouth as needed.   prn  . warfarin (COUMADIN) 6 MG tablet Take 6 mg by mouth daily. 6 mg for 6 days   Then 7 mg on day 7   11/11/2016 at Unknown time    Physical Exam: Blood pressure 131/60, pulse 72, temperature 98.5 F (36.9 C), temperature source Oral, resp. rate 18, height 5' 5.5" (1.664 m), weight 180 lb 14.4 oz (82.1 kg), SpO2 94 %.   Wt Readings from Last 1 Encounters:  11/12/16 180 lb 14.4 oz (82.1 kg)     General appearance: alert and cooperative Head: Normocephalic, without obvious abnormality, atraumatic Resp: diminished breath sounds bilaterally, rhonchi bilaterally and wheezes bilaterally Cardio: regular rate and rhythm GI: soft, non-tender; bowel sounds normal; no masses,  no organomegaly Pelvic:   Extremities: extremities normal, atraumatic, no cyanosis or edema Pulses: 2+ and symmetric Neurologic: Grossly normal  Labs:   Lab Results  Component Value Date   WBC 13.2 (H) 11/12/2016   HGB 14.5 11/12/2016   HCT 43.9 11/12/2016   MCV 87.7 11/12/2016   PLT 184 11/12/2016    Recent Labs Lab 11/12/16 0302  NA 137  K 4.2  CL 108  CO2 23  BUN 9  CREATININE 0.66  CALCIUM 8.7*  GLUCOSE 102*   Lab Results  Component Value Date   CKTOTAL 56 02/03/2013   CKMB 1.5 02/03/2013   TROPONINI 1.09 (HH) 11/12/2016      Radiology: Probable pulmonary vascular congestion EKG: Sinus rhythm with nonspecific ST-T wave changes.  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 She has Eisenmenger syndrome with an estimated  right ventricular systolic pressure of 79 mmHg done by echo in January of this year. [facility]. She has occasional shortness of breath for which she takes torsemide. She takes torsemide intermittently due to a headache that occurs after she takes it. She has paroxysmal atrial fibrillation and is on sotalol at 120 mg twice daily for this as well as warfarin for anticoagulation. Her admission INR was 1.79. She takes 6 mg of warfarin 6 days a week followed by 7 mg on day 7. She was admitted with fever chills cough productive of sputum and was noted to have a mildly elevated serum troponin. This is trending down. EKG shows no injury. This is likely secondary to right-sided strain. She does not appear to clinically have an acute coronary event. We'll continue with anticoagulation with warfarin modifying dose based on antibiotic use with an INR goal between 2 and  3. Would continue with Letairis and follow continue with sotalol as well as anticoagulation at this point would continue with antiemetic therapy. Would not proceed with left heart catheter other invasive evaluation at this point unless symptoms persist or worsen. We'll review echocardiogram when available. After improvement from a pulmonary standpoint, would recommend follow-up with her primary cardiologist and [facility]. Signed: Dalia HeadingKenneth A Fath MD, [facility] 11/12/2016, 1:38 PM

## 2016-11-12 NOTE — Progress Notes (Signed)
Pharmacy Antibiotic Note  Haley Adams is a 70 y.o. female admitted on 11/11/2016 with pneumonia.  Pharmacy has been consulted for ceftriaxone dosing.  Plan: Ceftriaxone 1 gram q 24 hours ordered.  Height: 5' 5.5" (166.4 cm) Weight: 180 lb 14.4 oz (82.1 kg) IBW/kg (Calculated) : 58.15  Temp (24hrs), Avg:100.3 F (37.9 C), Min:99.1 F (37.3 C), Max:101.1 F (38.4 C)   Recent Labs Lab 11/11/16 2058  WBC 11.7*  CREATININE 0.62    Estimated Creatinine Clearance: 71 mL/min (by C-G formula based on SCr of 0.62 mg/dL).    Allergies  Allergen Reactions  . Penicillins Other (See Comments)    Has patient had a PCN reaction causing immediate rash, facial/tongue/throat swelling, SOB or lightheadedness with hypotension: no Has patient had a PCN reaction causing severe rash involving mucus membranes or skin necrosis: no Has patient had a PCN reaction that required hospitalization no Has patient had a PCN reaction occurring within the last 10 years: no If all of the above answers are "NO", then may proceed with Cephalosporin use.  Fever 105   . Sulfa Antibiotics   . Tylenol [Acetaminophen]     Antimicrobials this admission: ceftriaxone 12/28 >>  azithromycin 12/27 >>  Levaquin x1  Dose adjustments this admission:   Microbiology results: 12/27 BCx: pending 12/28 Sputum: pending     12/27 UA: (-)  Thank you for allowing pharmacy to be a part of this patient's care.  Haley Adams S 11/12/2016 3:27 AM

## 2016-11-12 NOTE — Progress Notes (Signed)
[facility] [facility] [place] [facility]   PATIENT NAME: Haley Adams    MR#:  [id number]  DATE OF BIRTH:  Jun 29, 1946  SUBJECTIVE:  CHIEF COMPLAINT:   Chief Complaint  Patient presents with  . Shortness of Breath  . Fever  Feeling somewhat better REVIEW OF SYSTEMS:  Review of Systems  Constitutional: Positive for malaise/fatigue. Negative for chills, fever and weight loss.  HENT: Negative for nosebleeds and sore throat.   Eyes: Negative for blurred vision.  Respiratory: Positive for cough and shortness of breath. Negative for wheezing.   Cardiovascular: Negative for chest pain, orthopnea, leg swelling and PND.  Gastrointestinal: Negative for abdominal pain, constipation, diarrhea, heartburn, nausea and vomiting.  Genitourinary: Negative for dysuria and urgency.  Musculoskeletal: Negative for back pain.  Skin: Negative for rash.  Neurological: Positive for weakness. Negative for dizziness, speech change, focal weakness and headaches.  Endo/Heme/Allergies: Does not bruise/bleed easily.  Psychiatric/Behavioral: Negative for depression.    DRUG ALLERGIES:   Allergies  Allergen Reactions  . Penicillins Other (See Comments)    Has patient had a PCN reaction causing immediate rash, facial/tongue/throat swelling, SOB or lightheadedness with hypotension: no Has patient had a PCN reaction causing severe rash involving mucus membranes or skin necrosis: no Has patient had a PCN reaction that required hospitalization no Has patient had a PCN reaction occurring within the last 10 years: no If all of the above answers are "NO", then may proceed with Cephalosporin use.  Fever 105   . Sulfa Antibiotics   . Tylenol [Acetaminophen]    VITALS:  Blood pressure 131/60, pulse 72, temperature 98.5 F (36.9 C), temperature source Oral, resp. rate 18, height 5' 5.5" (1.664 m), weight 82.1 kg (180 lb 14.4 oz), SpO2 94 %. PHYSICAL EXAMINATION:  Physical Exam  Constitutional:  She is oriented to person, place, and time and well-developed, well-nourished, and in no distress.  HENT:  Head: Normocephalic and atraumatic.  Eyes: Conjunctivae and EOM are normal. Pupils are equal, round, and reactive to light.  Neck: Normal range of motion. Neck supple. No tracheal deviation present. No thyromegaly present.  Cardiovascular: Normal rate, regular rhythm and normal heart sounds.   Pulmonary/Chest: Effort normal and breath sounds normal. No respiratory distress. She has no wheezes. She exhibits no tenderness.  Abdominal: Soft. Bowel sounds are normal. She exhibits no distension. There is no tenderness.  Musculoskeletal: Normal range of motion.  Neurological: She is alert and oriented to person, place, and time. No cranial nerve deficit.  Skin: Skin is warm and dry. No rash noted.  Psychiatric: Mood and affect normal.   LABORATORY PANEL:   CBC  Recent Labs Lab 11/12/16 0302  WBC 13.2*  HGB 14.5  HCT 43.9  PLT 184   ------------------------------------------------------------------------------------------------------------------ Chemistries   Recent Labs Lab 11/12/16 0302  NA 137  K 4.2  CL 108  CO2 23  GLUCOSE 102*  BUN 9  CREATININE 0.66  CALCIUM 8.7*   RADIOLOGY:  No results found. ASSESSMENT AND PLAN:  70 y.o. female Admitted for Fever and productive cough.  She also had "rattling" in her chest for the past couple of days and developed significant sputum production, yellowish color  * Sepsis (HCC) -  - Present on admission - Due to pneumonia  * CAP (community acquired pneumonia) - antibiotics and cultures as above  * Elevated troponin - suspect this is likely due to demand ischemia from heart strain in the setting of her heart failure due to the  stress of her sepsis.  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 Lady GaryFath  All the records are reviewed and case discussed with Care Management/Social Worker. Management plans discussed with the patient, family (husband at bedside) and they are in agreement.  CODE STATUS: Full code  TOTAL TIME TAKING CARE OF THIS PATIENT: 35 minutes.   More than 50% of the time was spent in counseling/coordination of care: YES  POSSIBLE D/C IN 1-2 DAYS, DEPENDING ON CLINICAL CONDITION.   Delfino LovettVipul Sheniya Garciaperez M.D on 11/12/2016 at 6:25 PM  Between 7am to 6pm - Pager - [phone]  After 6pm go to [email or web address] - Social research officer, governmentpassword [email or web address]  [facility] [place] [facility]  Office  [phone]  CC: Primary care physician; [facility]  Note: This dictation was prepared with Dragon dictation along with smaller phrase technology. Any transcriptional errors that result from this process are unintentional.

## 2016-11-12 NOTE — Progress Notes (Signed)
When IV abx started both IV in bilateral arms infiltrated. Ice applied and a new IV started in left forearm. I will continue to assess.

## 2016-11-13 LAB — CBC
HCT: 42 % (ref 35.0–47.0)
Hemoglobin: 13.9 g/dL (ref 12.0–16.0)
MCH: 29.3 pg (ref 26.0–34.0)
MCHC: 33.1 g/dL (ref 32.0–36.0)
MCV: 88.7 fL (ref 80.0–100.0)
PLATELETS: 176 10*3/uL (ref 150–440)
RBC: 4.74 MIL/uL (ref 3.80–5.20)
RDW: 17.1 % — AB (ref 11.5–14.5)
WBC: 9.4 10*3/uL (ref 3.6–11.0)

## 2016-11-13 LAB — PROTIME-INR
INR: 2.18
Prothrombin Time: 24.6 seconds — ABNORMAL HIGH (ref 11.4–15.2)

## 2016-11-13 LAB — BASIC METABOLIC PANEL
ANION GAP: 6 (ref 5–15)
BUN: 16 mg/dL (ref 6–20)
CALCIUM: 8.2 mg/dL — AB (ref 8.9–10.3)
CO2: 24 mmol/L (ref 22–32)
Chloride: 109 mmol/L (ref 101–111)
Creatinine, Ser: 0.68 mg/dL (ref 0.44–1.00)
GFR calc non Af Amer: >60 mL/min (ref >60)
Glucose, Bld: 106 mg/dL — ABNORMAL HIGH (ref 65–99)
POTASSIUM: 4 mmol/L (ref 3.5–5.1)
Sodium: 139 mmol/L (ref 135–145)

## 2016-11-13 MED ORDER — LEVOFLOXACIN 500 MG PO TABS: 500.0000 mg | ORAL_TABLET | Freq: Every day | ORAL | 0 refills | Status: DC

## 2016-11-13 MED ORDER — AZITHROMYCIN 500 MG PO TABS: 500.0000 mg | ORAL_TABLET | Freq: Every day | ORAL | Status: DC

## 2016-11-13 NOTE — Progress Notes (Signed)
Pharmacy Antibiotic Note  Haley Adams is a 70 y.o. female admitted on 11/11/2016 with pneumonia.  Pharmacy has been consulted for ceftriaxone dosing.  Plan: Ceftriaxone 1 gram q 24 hours ordered.  Height: 5' 5.5" (166.4 cm) Weight: 178 lb 12.8 oz (81.1 kg) IBW/kg (Calculated) : 58.15  Temp (24hrs), Avg:98.9 F (37.2 C), Min:98.6 F (37 C), Max:99.2 F (37.3 C)   Recent Labs Lab 11/11/16 2058 11/12/16 0302 11/13/16 0518  WBC 11.7* 13.2* 9.4  CREATININE 0.62 0.66 0.68    Estimated Creatinine Clearance: 70.6 mL/min (by C-G formula based on SCr of 0.68 mg/dL).    Allergies  Allergen Reactions  . Penicillins Other (See Comments)    Has patient had a PCN reaction causing immediate rash, facial/tongue/throat swelling, SOB or lightheadedness with hypotension: no Has patient had a PCN reaction causing severe rash involving mucus membranes or skin necrosis: no Has patient had a PCN reaction that required hospitalization no Has patient had a PCN reaction occurring within the last 10 years: no If all of the above answers are "NO", then may proceed with Cephalosporin use.  Fever 105   . Sulfa Antibiotics   . Tylenol [Acetaminophen]     Antimicrobials this admission: ceftriaxone 12/28 >>  azithromycin 12/27 >>  Levaquin x1  Dose adjustments this admission:   Microbiology results: 12/27 BCx: pending 12/28 Sputum: pending     12/27 UA: (-)  Thank you for allowing pharmacy to be a part of this patient's care.  Caraline Deutschman D 11/13/2016 1:25 PM

## 2016-11-13 NOTE — Care Management (Signed)
No discharge needs identified by members of the care team 

## 2016-11-13 NOTE — Discharge Instructions (Signed)
Community-Acquired Pneumonia, Adult °Introduction °Pneumonia is an infection of the lungs. One type of pneumonia can happen while a person is in a hospital. A different type can happen when a person is not in a hospital (community-acquired pneumonia). It is easy for this kind to spread from person to person. It can spread to you if you breathe near an infected person who coughs or sneezes. Some symptoms include: °· A dry cough. °· A wet (productive) cough. °· Fever. °· Sweating. °· Chest pain. °Follow these instructions at home: °· Take over-the-counter and prescription medicines only as told by your doctor. °¨ Only take cough medicine if you are losing sleep. °¨ If you were prescribed an antibiotic medicine, take it as told by your doctor. Do not stop taking the antibiotic even if you start to feel better. °· Sleep with your head and neck raised (elevated). You can do this by putting a few pillows under your head, or you can sleep in a recliner. °· Do not use tobacco products. These include cigarettes, chewing tobacco, and e-cigarettes. If you need help quitting, ask your doctor. °· Drink enough water to keep your pee (urine) clear or pale yellow. °A shot (vaccine) can help prevent pneumonia. Shots are often suggested for: °· People older than 69 years of age. °· People older than 69 years of age: °¨ Who are having cancer treatment. °¨ Who have long-term (chronic) lung disease. °¨ Who have problems with their body's defense system (immune system). °You may also prevent pneumonia if you take these actions: °· Get the flu (influenza) shot every year. °· Go to the dentist as often as told. °· Wash your hands often. If soap and water are not available, use hand sanitizer. °Contact a doctor if: °· You have a fever. °· You lose sleep because your cough medicine does not help. °Get help right away if: °· You are short of breath and it gets worse. °· You have more chest pain. °· Your sickness gets worse. This is very  serious if: °¨ You are an older adult. °¨ Your body's defense system is weak. °· You cough up blood. °This information is not intended to replace advice given to you by your health care provider. Make sure you discuss any questions you have with your health care provider. °Document Released: 04/20/2008 Document Revised: 04/09/2016 Document Reviewed: 02/27/2015 °© 2017 Elsevier ° °

## 2016-11-13 NOTE — Progress Notes (Signed)
ANTICOAGULATION CONSULT NOTE - FOLLOW UP  Pharmacy Consult for warfarin dosing Indication: atrial fibrillation  Allergies  Allergen Reactions  . Penicillins Other (See Comments)    Has patient had a PCN reaction causing immediate rash, facial/tongue/throat swelling, SOB or lightheadedness with hypotension: no Has patient had a PCN reaction causing severe rash involving mucus membranes or skin necrosis: no Has patient had a PCN reaction that required hospitalization no Has patient had a PCN reaction occurring within the last 10 years: no If all of the above answers are "NO", then may proceed with Cephalosporin use.  Fever 105   . Sulfa Antibiotics   . Tylenol [Acetaminophen]     Patient Measurements: Height: 5' 5.5" (166.4 cm) Weight: 178 lb 12.8 oz (81.1 kg) IBW/kg (Calculated) : 58.15 Heparin Dosing Weight: n/a  Vital Signs: Temp: 98.6 F (37 C) (12/29 0453) Temp Source: Oral (12/29 0453) BP: 109/50 (12/29 0453) Pulse Rate: 64 (12/29 0453)  Labs:  Recent Labs  11/11/16 2058 11/11/16 2102 11/12/16 0302 11/12/16 0832 11/12/16 1433 11/13/16 0518  HGB 15.2  --  14.5  --   --  13.9  HCT 46.1  --  43.9  --   --  42.0  PLT 202  --  184  --   --  176  LABPROT  --  21.0*  --   --   --  24.6*  INR  --  1.79  --   --   --  2.18  CREATININE 0.62  --  0.66  --   --  0.68  TROPONINI 0.79*  --  1.59* 1.09* 0.71*  --     Estimated Creatinine Clearance: 70.6 mL/min (by C-G formula based on SCr of 0.68 mg/dL).   Medical History: Past Medical History:  Diagnosis Date  . Anxiety   . Atrial fibrillation (HCC)   . CHF (congestive heart failure) (HCC)   . GERD (gastroesophageal reflux disease)   . HLD (hyperlipidemia)   . Hypertension   . Pulmonary hypertension   . VSD (ventricular septal defect)     Medications:  Home regimen is 6 mg daily x 6 days with 7 mg on the 7th day.  Assessment: INR subtherapeutic on admission  Goal of Therapy:  INR 2-3    Plan:   Continue 6 mg daily. INR daily while on antibiotics.  Anoushka Divito D 11/13/2016,1:26 PM

## 2016-11-13 NOTE — Progress Notes (Signed)
PHARMACIST - PHYSICIAN COMMUNICATION DR:   Willis CONCERNING: Antibiotic IV to Oral Route Change Policy  RECOMMENDATION: This patient is receiving Azithromycin by the intravenous route.  Based on criteria approved by the Pharmacy and Therapeutics Committee, the antibiotic(s) is/are being converted to the equivalent oral dose form(s).   DESCRIPTION: These criteria include:  Patient being treated for a respiratory tract infection, urinary tract infection, cellulitis or clostridium difficile associated diarrhea if on metronidazole  The patient is not neutropenic and does not exhibit a GI malabsorption state  The patient is eating (either orally or via tube) and/or has been taking other orally administered medications for a least 24 hours  The patient is improving clinically and has a Tmax < 100.5  If you have questions about this conversion, please contact the Pharmacy Department  []  ( [phone] )  [place] [x]  ( [phone] )  [facility] []  ( [phone]  [place] []  ( [phone] )  [facility] []  ( [phone] )  [facility]     Ebonye Reade D. Marke Goodwyn, PharmD  

## 2016-11-14 LAB — CULTURE, RESPIRATORY: CULTURE: NORMAL

## 2016-11-14 LAB — CULTURE, RESPIRATORY W GRAM STAIN

## 2016-11-14 NOTE — Discharge Summary (Signed)
[facility] [place] [facility]   PATIENT NAME: Haley ChesterCynthia Adams    MR#:  [id number]  DATE OF BIRTH:  1946/01/10  DATE OF ADMISSION:  11/11/2016   ADMITTING PHYSICIAN: Oralia Manisavid Willis, MD  DATE OF DISCHARGE: 11/13/2016  3:19 PM  PRIMARY CARE PHYSICIAN: [facility] [place]   ADMISSION DIAGNOSIS:  Elevated troponin [R74.8] Community acquired pneumonia, unspecified laterality [J18.9] DISCHARGE DIAGNOSIS:  Principal Problem:   Sepsis (HCC) Active Problems:   CAP (community acquired pneumonia)   HTN (hypertension)   HLD (hyperlipidemia)   GERD (gastroesophageal reflux disease)   Anxiety   Chronic combined systolic and diastolic CHF (congestive heart failure) (HCC)   Atrial fibrillation (HCC)   Elevated troponin  SECONDARY DIAGNOSIS:   Past Medical History:  Diagnosis Date  . Anxiety   . Atrial fibrillation (HCC)   . CHF (congestive heart failure) (HCC)   . GERD (gastroesophageal reflux disease)   . HLD (hyperlipidemia)   . Hypertension   . Pulmonary hypertension   . VSD (ventricular septal defect)    HOSPITAL COURSE:  70 y.o.femaleAdmitted for Fever and productive cough.  She also had "rattling" in her chest for the past couple of days and developed significant sputum production, yellowish color  *Sepsis - now, resolved - Present on admission - Due to pneumonia  *CAP (community acquired pneumonia) - improving on antibiotics   *Elevated troponin - due to demand ischemia from heart strain in the setting of her heart failure due to the stress of her sepsis.   *HTN (hypertension) - continue home meds Anxiety - continue home meds Chronic combined systolic and diastolic CHF (congestive heart failure) (HCC) - well compensated. continue home meds Atrial fibrillation (HCC) - continue home meds including anticoagulation HLD (hyperlipidemia) - continue home meds  DISCHARGE CONDITIONS:  stable CONSULTS OBTAINED:  Treatment  Team:  Dalia HeadingKenneth A Fath, MD DRUG ALLERGIES:   Allergies  Allergen Reactions  . Penicillins Other (See Comments)    Has patient had a PCN reaction causing immediate rash, facial/tongue/throat swelling, SOB or lightheadedness with hypotension: no Has patient had a PCN reaction causing severe rash involving mucus membranes or skin necrosis: no Has patient had a PCN reaction that required hospitalization no Has patient had a PCN reaction occurring within the last 10 years: no If all of the above answers are "NO", then may proceed with Cephalosporin use.  Fever 105   . Sulfa Antibiotics   . Tylenol [Acetaminophen]    DISCHARGE MEDICATIONS:   Allergies as of 11/13/2016      Reactions   Penicillins Other (See Comments)   Has patient had a PCN reaction causing immediate rash, facial/tongue/throat swelling, SOB or lightheadedness with hypotension: no Has patient had a PCN reaction causing severe rash involving mucus membranes or skin necrosis: no Has patient had a PCN reaction that required hospitalization no Has patient had a PCN reaction occurring within the last 10 years: no If all of the above answers are "NO", then may proceed with Cephalosporin use. Fever 105    Sulfa Antibiotics    Tylenol [acetaminophen]       Medication List    TAKE these medications   aspirin EC 81 MG tablet Take 81 mg by mouth daily.   diazepam 2 MG tablet Commonly known as:  VALIUM Take 2 mg by mouth every 6 (six) hours as needed for anxiety.   LETAIRIS 5 MG tablet Generic drug:  ambrisentan Take 5 mg by mouth daily.   levofloxacin  500 MG tablet Commonly known as:  LEVAQUIN Take 1 tablet (500 mg total) by mouth daily.   losartan 50 MG tablet Commonly known as:  COZAAR Take 50 mg by mouth daily.   sotalol 120 MG tablet Commonly known as:  BETAPACE Take 120 mg by mouth 2 (two) times daily.   torsemide 10 MG tablet Commonly known as:  DEMADEX Take 10 mg by mouth as needed.   warfarin 6 MG  tablet Commonly known as:  COUMADIN Take 6 mg by mouth daily. 6 mg for 6 days   Then 7 mg on day 7        DISCHARGE INSTRUCTIONS:   DIET:  Regular diet DISCHARGE CONDITION:  Good ACTIVITY:  Activity as tolerated OXYGEN:  Home Oxygen: No.  Oxygen Delivery: room air DISCHARGE LOCATION:  home   If you experience worsening of your admission symptoms, develop shortness of breath, life threatening emergency, suicidal or homicidal thoughts you must seek medical attention immediately by calling 911 or calling your MD immediately  if symptoms less severe.  You Must read complete instructions/literature along with all the possible adverse reactions/side effects for all the Medicines you take and that have been prescribed to you. Take any new Medicines after you have completely understood and accpet all the possible adverse reactions/side effects.   Please note  You were cared for by a hospitalist during your hospital stay. If you have any questions about your discharge medications or the care you received while you were in the hospital after you are discharged, you can call the unit and asked to speak with the hospitalist on call if the hospitalist that took care of you is not available. Once you are discharged, your primary care physician will handle any further medical issues. Please note that NO REFILLS for any discharge medications will be authorized once you are discharged, as it is imperative that you return to your primary care physician (or establish a relationship with a primary care physician if you do not have one) for your aftercare needs so that they can reassess your need for medications and monitor your lab values.    On the day of Discharge:  VITAL SIGNS:  Blood pressure (!) 109/50, pulse 64, temperature 98.6 F (37 C), temperature source Oral, resp. rate 14, height 5' 5.5" (1.664 m), weight 81.1 kg (178 lb 12.8 oz), SpO2 91 %. PHYSICAL EXAMINATION:  GENERAL:  70  y.o.-year-old patient lying in the bed with no acute distress.  EYES: Pupils equal, round, reactive to light and accommodation. No scleral icterus. Extraocular muscles intact.  HEENT: Head atraumatic, normocephalic. Oropharynx and nasopharynx clear.  NECK:  Supple, no jugular venous distention. No thyroid enlargement, no tenderness.  LUNGS: Normal breath sounds bilaterally, no wheezing, rales,rhonchi or crepitation. No use of accessory muscles of respiration.  CARDIOVASCULAR: S1, S2 normal. No murmurs, rubs, or gallops.  ABDOMEN: Soft, non-tender, non-distended. Bowel sounds present. No organomegaly or mass.  EXTREMITIES: No pedal edema, cyanosis, or clubbing.  NEUROLOGIC: Cranial nerves II through XII are intact. Muscle strength 5/5 in all extremities. Sensation intact. Gait not checked.  PSYCHIATRIC: [name].  SKIN: No obvious rash, lesion, or ulcer.  DATA REVIEW:   CBC  Recent Labs Lab 11/13/16 0518  WBC 9.4  HGB 13.9  HCT 42.0  PLT 176    Chemistries   Recent Labs Lab 11/13/16 0518  NA 139  K 4.0  CL 109  CO2 24  GLUCOSE 106*  BUN 16  CREATININE 0.68  CALCIUM 8.2*    Follow-up Information    [facility] [place]. Go on 11/20/2016.   Why:  at 11:20am with Doristine Mango Contact information: [address] [place] [address] [phone]        Teryl Lucy, MD. Go on 12/04/2016.   Specialty:  Internal Medicine Why:  at 12:40 Contact information: [address] [place] [address] [phone]           Management plans discussed with the patient, family and they are in agreement.  CODE STATUS:  Code Status History    Date Active Date Inactive Code Status Order ID Comments User Context   11/12/2016  2:15 AM 11/13/2016  6:24 PM Full Code [id number]  Oralia Manis, MD ED      TOTAL TIME TAKING CARE OF THIS PATIENT: 45 minutes.    Delfino Lovett M.D on 11/14/2016 at 10:46 AM  Between 7am to 6pm - Pager -  [phone]  After 6pm go to [email or web address] - Social research officer, government  [facility]  Office  [phone]  CC: Primary care physician; [facility] [place]   Note: This dictation was prepared with Dragon dictation along with smaller phrase technology. Any transcriptional errors that result from this process are unintentional.

## 2016-11-16 LAB — CULTURE, BLOOD (ROUTINE X 2)
Culture: NO GROWTH
Culture: NO GROWTH

## 2018-09-30 ENCOUNTER — Encounter: Payer: Self-pay | Admitting: Emergency Medicine

## 2018-09-30 ENCOUNTER — Emergency Department: Payer: Medicare Other

## 2018-09-30 ENCOUNTER — Inpatient Hospital Stay
Admission: EM | Admit: 2018-09-30 | Discharge: 2018-10-04 | DRG: 193 | Disposition: A | Discharge status: Home / Self Care | Payer: Medicare Other | Attending: Internal Medicine | Admitting: Internal Medicine

## 2018-09-30 ENCOUNTER — Other Ambulatory Visit: Payer: Self-pay

## 2018-09-30 DIAGNOSIS — Z820 Family history of epilepsy and other diseases of the nervous system: Secondary | ICD-10-CM

## 2018-09-30 DIAGNOSIS — Z9981 Dependence on supplemental oxygen: Secondary | ICD-10-CM | POA: Diagnosis not present

## 2018-09-30 DIAGNOSIS — E785 Hyperlipidemia, unspecified: Secondary | ICD-10-CM | POA: Diagnosis present

## 2018-09-30 DIAGNOSIS — I5022 Chronic systolic (congestive) heart failure: Secondary | ICD-10-CM | POA: Diagnosis present

## 2018-09-30 DIAGNOSIS — Z7982 Long term (current) use of aspirin: Secondary | ICD-10-CM

## 2018-09-30 DIAGNOSIS — J189 Pneumonia, unspecified organism: Secondary | ICD-10-CM | POA: Diagnosis present

## 2018-09-30 DIAGNOSIS — Z7901 Long term (current) use of anticoagulants: Secondary | ICD-10-CM

## 2018-09-30 DIAGNOSIS — I272 Pulmonary hypertension, unspecified: Secondary | ICD-10-CM | POA: Diagnosis present

## 2018-09-30 DIAGNOSIS — J9601 Acute respiratory failure with hypoxia: Secondary | ICD-10-CM | POA: Diagnosis present

## 2018-09-30 DIAGNOSIS — R042 Hemoptysis: Secondary | ICD-10-CM | POA: Diagnosis present

## 2018-09-30 DIAGNOSIS — I482 Chronic atrial fibrillation, unspecified: Secondary | ICD-10-CM | POA: Diagnosis present

## 2018-09-30 DIAGNOSIS — R040 Epistaxis: Secondary | ICD-10-CM | POA: Diagnosis not present

## 2018-09-30 DIAGNOSIS — J45901 Unspecified asthma with (acute) exacerbation: Secondary | ICD-10-CM | POA: Diagnosis present

## 2018-09-30 DIAGNOSIS — Q210 Ventricular septal defect: Secondary | ICD-10-CM

## 2018-09-30 DIAGNOSIS — Z880 Allergy status to penicillin: Secondary | ICD-10-CM

## 2018-09-30 DIAGNOSIS — Z825 Family history of asthma and other chronic lower respiratory diseases: Secondary | ICD-10-CM

## 2018-09-30 DIAGNOSIS — I110 Hypertensive heart disease with heart failure: Secondary | ICD-10-CM | POA: Diagnosis present

## 2018-09-30 LAB — CBC
HCT: 46.7 % — ABNORMAL HIGH (ref 36.0–46.0)
HEMOGLOBIN: 14.5 g/dL (ref 12.0–15.0)
MCH: 27.9 pg (ref 26.0–34.0)
MCHC: 31 g/dL (ref 30.0–36.0)
MCV: 89.8 fL (ref 80.0–100.0)
Platelets: 313 10*3/uL (ref 150–400)
RBC: 5.2 MIL/uL — ABNORMAL HIGH (ref 3.87–5.11)
RDW: 16.5 % — AB (ref 11.5–15.5)
WBC: 13 10*3/uL — AB (ref 4.0–10.5)
nRBC: 0 % (ref 0.0–0.2)

## 2018-09-30 LAB — COMPREHENSIVE METABOLIC PANEL
ALBUMIN: 3.9 g/dL (ref 3.5–5.0)
ALK PHOS: 48 U/L (ref 38–126)
ALT: 10 U/L (ref 0–44)
ANION GAP: 8 (ref 5–15)
AST: 13 U/L — ABNORMAL LOW (ref 15–41)
BILIRUBIN TOTAL: 0.7 mg/dL (ref 0.3–1.2)
BUN: 10 mg/dL (ref 8–23)
CALCIUM: 8.9 mg/dL (ref 8.9–10.3)
CO2: 24 mmol/L (ref 22–32)
Chloride: 106 mmol/L (ref 98–111)
Creatinine, Ser: 0.6 mg/dL (ref 0.44–1.00)
GFR calc non Af Amer: >60 mL/min (ref >60)
GLUCOSE: 174 mg/dL — AB (ref 70–99)
POTASSIUM: 4.2 mmol/L (ref 3.5–5.1)
SODIUM: 138 mmol/L (ref 135–145)
TOTAL PROTEIN: 7.1 g/dL (ref 6.5–8.1)

## 2018-09-30 LAB — TROPONIN I: Troponin I: <0.03 ng/mL (ref <0.03)

## 2018-09-30 LAB — PROTIME-INR
INR: 2.15
Prothrombin Time: 23.7 seconds — ABNORMAL HIGH (ref 11.4–15.2)

## 2018-09-30 LAB — TSH: TSH: 2.617 u[IU]/mL (ref 0.350–4.500)

## 2018-09-30 LAB — HEMOGLOBIN A1C
Hgb A1c MFr Bld: 6.2 % — ABNORMAL HIGH (ref 4.8–5.6)
Mean Plasma Glucose: 131.24 mg/dL

## 2018-09-30 MED ORDER — ORAL CARE MOUTH RINSE
15.0000 mL | Freq: Two times a day (BID) | OROMUCOSAL | Status: DC
  Administered 2018-09-30 – 2018-10-04 (×9): 15 mL via OROMUCOSAL

## 2018-09-30 MED ORDER — IBUPROFEN 400 MG PO TABS
400.0000 mg | ORAL_TABLET | Freq: Four times a day (QID) | ORAL | Status: DC | PRN
  Administered 2018-09-30 – 2018-10-02 (×2): 400 mg via ORAL
  Filled 2018-09-30 (×2): qty 1

## 2018-09-30 MED ORDER — ONDANSETRON HCL 4 MG/2ML IJ SOLN: 4.0000 mg | Freq: Four times a day (QID) | INTRAMUSCULAR | Status: DC | PRN

## 2018-09-30 MED ORDER — DOCUSATE SODIUM 100 MG PO CAPS
100.0000 mg | ORAL_CAPSULE | Freq: Two times a day (BID) | ORAL | Status: DC
  Administered 2018-09-30 – 2018-10-04 (×8): 100 mg via ORAL
  Filled 2018-09-30 (×9): qty 1

## 2018-09-30 MED ORDER — IPRATROPIUM-ALBUTEROL 0.5-2.5 (3) MG/3ML IN SOLN
3.0000 mL | Freq: Once | RESPIRATORY_TRACT | Status: AC
  Administered 2018-09-30: 3 mL via RESPIRATORY_TRACT

## 2018-09-30 MED ORDER — IPRATROPIUM-ALBUTEROL 0.5-2.5 (3) MG/3ML IN SOLN
RESPIRATORY_TRACT | Status: AC
  Filled 2018-09-30: qty 3

## 2018-09-30 MED ORDER — IPRATROPIUM-ALBUTEROL 0.5-2.5 (3) MG/3ML IN SOLN
3.0000 mL | Freq: Once | RESPIRATORY_TRACT | Status: AC
  Administered 2018-09-30: 3 mL via RESPIRATORY_TRACT
  Filled 2018-09-30: qty 3

## 2018-09-30 MED ORDER — WARFARIN - PHARMACIST DOSING INPATIENT
Freq: Every day | Status: DC
  Administered 2018-09-30 – 2018-10-01 (×2)

## 2018-09-30 MED ORDER — WARFARIN SODIUM 6 MG PO TABS
6.0000 mg | ORAL_TABLET | Freq: Every day | ORAL | Status: DC
  Filled 2018-09-30: qty 1

## 2018-09-30 MED ORDER — ONDANSETRON HCL 4 MG/2ML IJ SOLN
INTRAMUSCULAR | Status: AC
  Filled 2018-09-30: qty 2

## 2018-09-30 MED ORDER — LEVOFLOXACIN IN D5W 750 MG/150ML IV SOLN
750.0000 mg | INTRAVENOUS | Status: DC
  Administered 2018-10-01 – 2018-10-04 (×4): 750 mg via INTRAVENOUS
  Filled 2018-09-30 (×4): qty 150

## 2018-09-30 MED ORDER — AMBRISENTAN 5 MG PO TABS
5.0000 mg | ORAL_TABLET | Freq: Every day | ORAL | Status: DC
  Filled 2018-09-30: qty 1

## 2018-09-30 MED ORDER — LEVOFLOXACIN IN D5W 500 MG/100ML IV SOLN
500.0000 mg | Freq: Once | INTRAVENOUS | Status: AC
  Administered 2018-09-30: 500 mg via INTRAVENOUS
  Filled 2018-09-30: qty 100

## 2018-09-30 MED ORDER — DIAZEPAM 2 MG PO TABS: 2.0000 mg | ORAL_TABLET | Freq: Four times a day (QID) | ORAL | Status: DC | PRN

## 2018-09-30 MED ORDER — ASPIRIN EC 81 MG PO TBEC
81.0000 mg | DELAYED_RELEASE_TABLET | Freq: Every day | ORAL | Status: DC
  Filled 2018-09-30: qty 1

## 2018-09-30 MED ORDER — ONDANSETRON HCL 4 MG PO TABS: 4.0000 mg | ORAL_TABLET | Freq: Four times a day (QID) | ORAL | Status: DC | PRN

## 2018-09-30 MED ORDER — WARFARIN SODIUM 6 MG PO TABS
6.0000 mg | ORAL_TABLET | Freq: Once | ORAL | Status: AC
  Administered 2018-09-30: 6 mg via ORAL
  Filled 2018-09-30: qty 1

## 2018-09-30 MED ORDER — SOTALOL HCL 80 MG PO TABS
120.0000 mg | ORAL_TABLET | Freq: Two times a day (BID) | ORAL | Status: DC
  Administered 2018-09-30 – 2018-10-04 (×8): 120 mg via ORAL
  Filled 2018-09-30 (×10): qty 1.5

## 2018-09-30 MED ORDER — ONDANSETRON HCL 4 MG/2ML IJ SOLN
4.0000 mg | Freq: Once | INTRAMUSCULAR | Status: AC
  Administered 2018-09-30: 4 mg via INTRAVENOUS

## 2018-09-30 MED ORDER — LOSARTAN POTASSIUM 50 MG PO TABS
50.0000 mg | ORAL_TABLET | Freq: Every day | ORAL | Status: DC
  Filled 2018-09-30: qty 1

## 2018-09-30 MED ORDER — TORSEMIDE 20 MG PO TABS: 10.0000 mg | ORAL_TABLET | ORAL | Status: DC | PRN

## 2018-09-30 NOTE — ED Triage Notes (Signed)
Patient to ER for c/o coughing up yellow sputum and shortness of breath since yesterday. Patient reports low grade fever at home tonight ("just below 100"). Patient able to speak in complete sentences without difficulty. Ambulatory to triage.

## 2018-09-30 NOTE — ED Notes (Signed)
ED Provider at bedside. 

## 2018-09-30 NOTE — Consult Note (Signed)
ANTICOAGULATION CONSULT NOTE - Initial Consult  Pharmacy Consult for Warfarin dosing Indication: atrial fibrillation  Allergies  Allergen Reactions  . Penicillins Other (See Comments)    Has patient had a PCN reaction causing immediate rash, facial/tongue/throat swelling, SOB or lightheadedness with hypotension: no Has patient had a PCN reaction causing severe rash involving mucus membranes or skin necrosis: no Has patient had a PCN reaction that required hospitalization no Has patient had a PCN reaction occurring within the last 10 years: no If all of the above answers are "NO", then may proceed with Cephalosporin use.  Fever 105   . Sulfa Antibiotics   . Tylenol [Acetaminophen]     Patient Measurements: Height: 5\' 6"  (167.6 cm) Weight: 184 lb (83.5 kg) IBW/kg (Calculated) : 59.3 Heparin Dosing Weight:   Vital Signs: Temp: 100.2 F (37.9 C) (11/15 1050) Temp Source: Oral (11/15 1050) BP: 114/54 (11/15 1050) Pulse Rate: 79 (11/15 1050)  Labs: Recent Labs    09/30/18 0207 09/30/18 0240  HGB 14.5  --   HCT 46.7*  --   PLT 313  --   CREATININE  --  0.60  TROPONINI  --  <0.03    Estimated Creatinine Clearance: 70.3 mL/min (by C-G formula based on SCr of 0.6 mg/dL).   Medical History: Past Medical History:  Diagnosis Date  . Anxiety   . Atrial fibrillation (HCC)   . CHF (congestive heart failure) (HCC)   . GERD (gastroesophageal reflux disease)   . HLD (hyperlipidemia)   . Hypertension   . Pulmonary hypertension (HCC)   . VSD (ventricular septal defect)     Medications:  Medications Prior to Admission  Medication Sig Dispense Refill Last Dose  . ambrisentan (LETAIRIS) 5 MG tablet Take 5 mg by mouth daily.   09/29/2018 at Unknown time  . hydrochlorothiazide (MICROZIDE) 12.5 MG capsule Take 12.5 mg by mouth daily.     Marland Kitchen. losartan (COZAAR) 50 MG tablet Take 50 mg by mouth daily.   09/29/2018 at Unknown time  . sotalol (BETAPACE) 120 MG tablet Take 120 mg by  mouth 2 (two) times daily.   09/29/2018 at Unknown time  . warfarin (COUMADIN) 6 MG tablet Take 6 mg by mouth daily. 6 mg Mon, Wed, Fri, Sat    Then 7 mg on Sun, Tue, Thur   09/29/2018 at Unknown time  . aspirin EC 81 MG tablet Take 81 mg by mouth daily.   Not Taking at Unknown time  . diazepam (VALIUM) 2 MG tablet Take 2 mg by mouth every 6 (six) hours as needed for anxiety.   prn  . levofloxacin (LEVAQUIN) 500 MG tablet Take 1 tablet (500 mg total) by mouth daily. (Patient not taking: Reported on 09/30/2018) 5 tablet 0 Not Taking  . torsemide (DEMADEX) 10 MG tablet Take 10 mg by mouth as needed.   Not Taking at Unknown time    Assessment: 72 yo female with past medical history of pulmonary hypertension secondary to a large VSD, atrial fibrillation and CHF.  Patient on Levofloxacin 750 mg once daily for PNA. Levofloxacin can potentially elevate the patient's INR.  PTA dosing for Warfarin: 6 mg MWFSat 7 mg SunTueThurs  Goal of Therapy:  INR 2-3 Monitor platelets by anticoagulation protocol: Yes   Plan:  11/15 INR 2.15 Will order warfarin 6 mg once at 1800. Will monitor INR daily and dose warfarin appropriately.  Possible INR elevation due to current Levofloxacin therapy.   Orinda Kennerhris A Saquan Furtick, PharmD Clinical Pharmacist 09/30/2018,12:54 PM

## 2018-09-30 NOTE — Progress Notes (Signed)
Pt's O2 sats 87% on 6L O2.  Rest of VSS. Pt does not seem to be in respiratory distress.  Dr Katheren ShamsSalary made aware. HFNC placed by RT.

## 2018-09-30 NOTE — ED Notes (Signed)
Patient transported to X-ray 

## 2018-09-30 NOTE — Progress Notes (Signed)
Pharmacy Antibiotic Note  Haley ChesterCynthia Adams is a 72 y.o. female admitted on 09/30/2018 with pneumonia.  Pharmacy has been consulted for Levaquin dosing.  Plan: Levaquin 750 mg IV q 24 hours ordered  Height: 5\' 6"  (167.6 cm) Weight: 184 lb (83.5 kg) IBW/kg (Calculated) : 59.3  Temp (24hrs), Avg:100.1 F (37.8 C), Min:100 F (37.8 C), Max:100.2 F (37.9 C)  Recent Labs  Lab 09/30/18 0207 09/30/18 0240  WBC 13.0*  --   CREATININE  --  0.60    Estimated Creatinine Clearance: 70.3 mL/min (by C-G formula based on SCr of 0.6 mg/dL).    Allergies  Allergen Reactions  . Penicillins Other (See Comments)    Has patient had a PCN reaction causing immediate rash, facial/tongue/throat swelling, SOB or lightheadedness with hypotension: no Has patient had a PCN reaction causing severe rash involving mucus membranes or skin necrosis: no Has patient had a PCN reaction that required hospitalization no Has patient had a PCN reaction occurring within the last 10 years: no If all of the above answers are "NO", then may proceed with Cephalosporin use.  Fever 105   . Sulfa Antibiotics   . Tylenol [Acetaminophen]     Antimicrobials this admission: Levaquin 11/15  >>    >>   Dose adjustments this admission:   Microbiology results:   Thank you for allowing pharmacy to be a part of this patient's care.  Ginevra Tacker S 09/30/2018 4:31 AM

## 2018-09-30 NOTE — ED Notes (Signed)
Patient's oxygen saturation level 87% on 5L [place]. Patient's oxygen level increased to 6L [place]. MD Manson PasseyBrown informed. RN will continue to monitor.

## 2018-09-30 NOTE — ED Notes (Signed)
Patient's oxygen saturation level 88% on 4L [place]. Patient's oxygen increased to 5L [place]. RN will continue to monitor.

## 2018-09-30 NOTE — Care Management Note (Signed)
Case Management Note  Patient Details  Name: Haley Adams MRN: [id number] Date of Birth: 1946/06/29  Subjective/Objective: Admitted to [facility] with the diagnosis of pneumonia. Lives with husband, Remi DeterSamuel [phone]). [facility] [place] [facility] Wednesday. [facility] [address],  No home Health. No skilled facility. Home oxygen per [employer] x 8 years. Uses 4 liters at night only. No other medical equipment in the home. Takes care of all basic activities of daily living herself, doesn't drive. 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 Last fall was 6 weeks ago. Good appetite. No weight loss or gain. Family will transport                   Action/Plan: Will continue to follow for discharge plans   Expected Discharge Date:                  Expected Discharge Plan:     In-House Referral:   yes  Discharge planning Services   yes  Post Acute Care Choice:    Choice offered to:     DME Arranged:    DME Agency:     HH Arranged:    HH Agency:     Status of Service:     If discussed at [employer] Meetings, dates discussed:    Additional Comments:  Gwenette GreetBrenda S Oliver Heitzenrater, RN MSN CCM Care Management [phone] 09/30/2018, 8:57 AM

## 2018-09-30 NOTE — ED Notes (Signed)
Patient c/o nausea. MD Manson PasseyBrown informed

## 2018-09-30 NOTE — H&P (Signed)
Haley Adams is an 72 y.o. female.   Chief Complaint: Shortness of breath HPI: The patient with past medical history of pulmonary hypertension secondary to a large VSD, atrial fibrillation and CHF presents to the emergency department complaining of general malaise and shortness of breath.  The patient had been well 24 hours ago and had a physical at which time her doctor reported that she could hear some rattling in her chest, but at that time the patient felt well.  Since that time she has had a low-grade fever as well as productive cough.  She is on chronic oxygen therapy but required more in the emergency department to maintain her oxygen saturations between 88 to 90%.  Upon arrival oxygen saturation was in the low 80s.  Chest x-ray revealed a lingular pneumonia.  The patient was started on antibiotics prior to the emergency department staff calling the hospitalist service for admission.  Past Medical History:  Diagnosis Date  . Anxiety   . Atrial fibrillation ([place])   . CHF (congestive heart failure) ([place])   . GERD (gastroesophageal reflux disease)   . HLD (hyperlipidemia)   . Hypertension   . Pulmonary hypertension ([place])   . VSD (ventricular septal defect)     Past Surgical History:  Procedure Laterality Date  . CARDIAC CATHETERIZATION    . EYE SURGERY    . TOTAL ABDOMINAL HYSTERECTOMY W/ BILATERAL SALPINGOOPHORECTOMY      Family History  Problem Relation Age of Onset  . Parkinsonism Mother   . COPD Father   . Cancer Brother   . Aneurysm Brother    Social History:  reports that she has never smoked. She has never used smokeless tobacco. She reports that she does not drink alcohol or use drugs.  Allergies:  Allergies  Allergen Reactions  . Penicillins Other (See Comments)    Has patient had a PCN reaction causing immediate rash, facial/tongue/throat swelling, SOB or lightheadedness with hypotension: no Has patient had a PCN reaction causing severe rash involving mucus  membranes or skin necrosis: no Has patient had a PCN reaction that required hospitalization no Has patient had a PCN reaction occurring within the last 10 years: no If all of the above answers are "NO", then may proceed with Cephalosporin use.  Fever 105   . Sulfa Antibiotics   . Tylenol [Acetaminophen]     Medications Prior to Admission  Medication Sig Dispense Refill  . ambrisentan (LETAIRIS) 5 MG tablet Take 5 mg by mouth daily.    Marland Kitchen aspirin EC 81 MG tablet Take 81 mg by mouth daily.    . diazepam (VALIUM) 2 MG tablet Take 2 mg by mouth every 6 (six) hours as needed for anxiety.    Marland Kitchen levofloxacin (LEVAQUIN) 500 MG tablet Take 1 tablet (500 mg total) by mouth daily. 5 tablet 0  . losartan (COZAAR) 50 MG tablet Take 50 mg by mouth daily.    . sotalol (BETAPACE) 120 MG tablet Take 120 mg by mouth 2 (two) times daily.    Marland Kitchen torsemide (DEMADEX) 10 MG tablet Take 10 mg by mouth as needed.    . warfarin (COUMADIN) 6 MG tablet Take 6 mg by mouth daily. 6 mg for 6 days   Then 7 mg on day 7      Results for orders placed or performed during the hospital encounter of 09/30/18 (from the past 48 hour(s))  CBC     Status: Abnormal   Collection Time: 09/30/18  2:07 AM  Result  Value Ref Range   WBC 13.0 (H) 4.0 - 10.5 K/uL   RBC 5.20 (H) 3.87 - 5.11 MIL/uL   Hemoglobin 14.5 12.0 - 15.0 g/dL   HCT 46.7 (H) 36.0 - 46.0 %   MCV 89.8 80.0 - 100.0 fL   MCH 27.9 26.0 - 34.0 pg   MCHC 31.0 30.0 - 36.0 g/dL   RDW 16.5 (H) 11.5 - 15.5 %   Platelets 313 150 - 400 K/uL   nRBC 0.0 0.0 - 0.2 %    Comment: Performed at [facility], [address] [place], [place] [address]  Comprehensive metabolic panel     Status: Abnormal   Collection Time: 09/30/18  2:40 AM  Result Value Ref Range   Sodium 138 135 - 145 mmol/L   Potassium 4.2 3.5 - 5.1 mmol/L   Chloride 106 98 - 111 mmol/L   CO2 24 22 - 32 mmol/L   Glucose, Bld 174 (H) 70 - 99 mg/dL   BUN 10 8 - 23 mg/dL   Creatinine, Ser 0.60 0.44 -  1.00 mg/dL   Calcium 8.9 8.9 - 10.3 mg/dL   Total Protein 7.1 6.5 - 8.1 g/dL   Albumin 3.9 3.5 - 5.0 g/dL   AST 13 (L) 15 - 41 U/L   ALT 10 0 - 44 U/L   Alkaline Phosphatase 48 38 - 126 U/L   Total Bilirubin 0.7 0.3 - 1.2 mg/dL   GFR calc non Af Amer >60 >60 mL/min   GFR calc Af Amer >60 >60 mL/min    Comment: (NOTE) The eGFR has been calculated using the CKD EPI equation. This calculation has not been validated in all clinical situations. eGFR's persistently <60 mL/min signify possible Chronic Kidney Disease.    Anion gap 8 5 - 15    Comment: Performed at [facility], [address] [place], [place] [address]  Troponin I -     Status: None   Collection Time: 09/30/18  2:40 AM  Result Value Ref Range   Troponin I <0.03 <0.03 ng/mL    Comment: Performed at [facility], [address] [place], [place] [address]  TSH     Status: None   Collection Time: 09/30/18  5:33 AM  Result Value Ref Range   TSH 2.617 0.350 - 4.500 uIU/mL    Comment: Performed by a 3rd Generation assay with a functional sensitivity of <=0.01 uIU/mL. Performed at [facility], [address] [place], [place] [address]   Hemoglobin A1c     Status: Abnormal   Collection Time: 09/30/18  5:33 AM  Result Value Ref Range   Hgb A1c MFr Bld 6.2 (H) 4.8 - 5.6 %    Comment: (NOTE) Pre diabetes:          5.7%-6.4% Diabetes:              >6.4% Glycemic control for   <7.0% adults with diabetes    Mean Plasma Glucose 131.24 mg/dL    Comment: Performed at [facility] [address], [place], [place] [address]   Dg Chest 2 View  Result Date: 09/30/2018 CLINICAL DATA:  Cough, shortness of breath since yesterday. EXAM: CHEST - 2 VIEW COMPARISON:  Chest radiograph November 11, 2016 FINDINGS: Cardiac silhouette is mildly enlarged and unchanged. Calcified aortic arch. Similar pulmonary vascular congestion. Patchy lingular consolidation without pleural effusion. No pneumothorax. Soft  tissue planes and included osseous structures are non suspicious. IMPRESSION: 1. Lingular atelectasis versus pneumonia.  2. Stable cardiomegaly and pulmonary vascular congestion. 3.  Aortic Atherosclerosis (ICD10-I70.0). Electronically Signed   By: Elon Alas M.D.   On: 09/30/2018 02:19    Review of Systems  Constitutional: Negative for chills and fever.  HENT: Negative for sore throat and tinnitus.   Eyes: Negative for blurred vision and redness.  Respiratory: Positive for sputum production and shortness of breath. Negative for cough.   Cardiovascular: Negative for chest pain, palpitations, orthopnea and PND.  Gastrointestinal: Negative for abdominal pain, diarrhea, nausea and vomiting.  Genitourinary: Negative for dysuria, frequency and urgency.  Musculoskeletal: Negative for joint pain and myalgias.  Skin: Negative for rash.       No lesions  Neurological: Negative for speech change, focal weakness and weakness.  Endo/Heme/Allergies: Does not bruise/bleed easily.       No temperature intolerance  Psychiatric/Behavioral: Negative for depression and suicidal ideas.    Blood pressure (!) 143/59, pulse 85, temperature 100.1 F (37.8 C), temperature source Oral, resp. rate 18, height 5' 6"  (1.676 m), weight 83.5 kg, SpO2 90 %. Physical Exam  Vitals reviewed. Constitutional: She is oriented to person, place, and time. She appears well-developed and well-nourished. No distress.  HENT:  Head: Normocephalic and atraumatic.  Mouth/Throat: Oropharynx is clear and moist.  Eyes: Pupils are equal, round, and reactive to light. Conjunctivae and EOM are normal. No scleral icterus.  Neck: Normal range of motion. Neck supple. No JVD present. No tracheal deviation present. No thyromegaly present.  Cardiovascular: Normal rate, regular rhythm and normal heart sounds. Exam reveals no gallop and no friction rub.  No murmur heard. Respiratory: Effort normal. She has rales in the left lower field.   GI: Soft. Bowel sounds are normal. She exhibits no distension. There is no tenderness.  Genitourinary:  Genitourinary Comments: Deferred  Musculoskeletal: Normal range of motion. She exhibits no edema.  Lymphadenopathy:    She has no cervical adenopathy.  Neurological: She is alert and oriented to person, place, and time. No cranial nerve deficit. She exhibits normal muscle tone.  Skin: Skin is warm and dry. No rash noted. No erythema.  Psychiatric: She has a normal mood and affect. Her behavior is normal. Judgment and thought content normal.     Assessment/Plan [name] admitted for pneumonia. 1.  Pneumonia: Community-acquired; continue Levaquin (penicillin allergy).  The patient is hemodynamically stable.  No signs or symptoms of sepsis. 2.  Pulmonary hypertension: The patient carries this diagnosis which has confirmed by right heart cath as well as echocardiogram.  She has a large VSD.  Goal systolic blood pressure is greater than 130.  Continue ambrisentan and warfarin. 3.  Atrial fibrillation: Rate controlled; continue sotalol 4.  Hypertension: At goal; continue losartan 5.  CHF: Chronic; systolic.  Continue torsemide 6.  DVT: As above 7.  GI prophylaxis: None The patient is a full code.  Time spent on admission orders and patient care approximately 45 minutes  Harrie Foreman, MD 09/30/2018, 8:52 AM

## 2018-10-01 LAB — PROTIME-INR
INR: 2.22
Prothrombin Time: 24.3 seconds — ABNORMAL HIGH (ref 11.4–15.2)

## 2018-10-01 MED ORDER — GUAIFENESIN ER 600 MG PO TB12
600.0000 mg | ORAL_TABLET | Freq: Two times a day (BID) | ORAL | Status: DC
  Administered 2018-10-01 – 2018-10-04 (×7): 600 mg via ORAL
  Filled 2018-10-01 (×7): qty 1

## 2018-10-01 MED ORDER — SODIUM CHLORIDE 0.9% FLUSH
3.0000 mL | INTRAVENOUS | Status: DC | PRN
  Administered 2018-10-03: 08:00:00 3 mL via INTRAVENOUS
  Filled 2018-10-01: qty 3

## 2018-10-01 MED ORDER — BUDESONIDE 0.5 MG/2ML IN SUSP
0.5000 mg | Freq: Two times a day (BID) | RESPIRATORY_TRACT | Status: DC
  Administered 2018-10-01 – 2018-10-04 (×6): 0.5 mg via RESPIRATORY_TRACT
  Filled 2018-10-01 (×6): qty 2

## 2018-10-01 MED ORDER — AMBRISENTAN 5 MG PO TABS
5.0000 mg | ORAL_TABLET | Freq: Every day | ORAL | Status: DC
  Administered 2018-10-01: 5 mg via ORAL
  Filled 2018-10-01: qty 1

## 2018-10-01 MED ORDER — IPRATROPIUM-ALBUTEROL 0.5-2.5 (3) MG/3ML IN SOLN
3.0000 mL | Freq: Four times a day (QID) | RESPIRATORY_TRACT | Status: DC
  Administered 2018-10-01 – 2018-10-02 (×3): 3 mL via RESPIRATORY_TRACT
  Filled 2018-10-01 (×3): qty 3

## 2018-10-01 MED ORDER — WARFARIN SODIUM 6 MG PO TABS
6.0000 mg | ORAL_TABLET | Freq: Once | ORAL | Status: AC
  Administered 2018-10-01: 6 mg via ORAL
  Filled 2018-10-01: qty 1

## 2018-10-01 MED ORDER — SODIUM CHLORIDE 0.9% FLUSH
3.0000 mL | Freq: Two times a day (BID) | INTRAVENOUS | Status: DC
  Administered 2018-10-01 – 2018-10-04 (×7): 3 mL via INTRAVENOUS

## 2018-10-01 MED ORDER — METHYLPREDNISOLONE SODIUM SUCC 125 MG IJ SOLR
60.0000 mg | Freq: Four times a day (QID) | INTRAMUSCULAR | Status: DC
  Administered 2018-10-01 – 2018-10-02 (×4): 60 mg via INTRAVENOUS
  Filled 2018-10-01 (×5): qty 2

## 2018-10-01 NOTE — Progress Notes (Signed)
Pt ambulated in hall with 02/[place]/10 liters with pulse ox 86-92 % with pt reporting she tolerated well.

## 2018-10-01 NOTE — Progress Notes (Signed)
[employer] [facility] [place] [facility]   PATIENT NAME: Haley Adams    MR#:  [id number]  DATE OF BIRTH:  28-Feb-1946  SUBJECTIVE:  CHIEF COMPLAINT:   Chief Complaint  Patient presents with  . Cough  . Shortness of Breath  Patient feeling somewhat better, no events overnight, patient states that she was told that she did have an asthma history which is mild-moderate, patient would like to be started back on her Letairis  REVIEW OF SYSTEMS:  CONSTITUTIONAL: No fever, fatigue or weakness.  EYES: No blurred or double vision.  EARS, NOSE, AND THROAT: No tinnitus or ear pain.  RESPIRATORY: No cough, shortness of breath, wheezing or hemoptysis.  CARDIOVASCULAR: No chest pain, orthopnea, edema.  GASTROINTESTINAL: No nausea, vomiting, diarrhea or abdominal pain.  GENITOURINARY: No dysuria, hematuria.  ENDOCRINE: No polyuria, nocturia,  HEMATOLOGY: No anemia, easy bruising or bleeding SKIN: No rash or lesion. MUSCULOSKELETAL: No joint pain or arthritis.   NEUROLOGIC: No tingling, numbness, weakness.  PSYCHIATRY: No anxiety or depression.   ROS  DRUG ALLERGIES:   Allergies  Allergen Reactions  . Penicillins Other (See Comments)    Has patient had a PCN reaction causing immediate rash, facial/tongue/throat swelling, SOB or lightheadedness with hypotension: no Has patient had a PCN reaction causing severe rash involving mucus membranes or skin necrosis: no Has patient had a PCN reaction that required hospitalization no Has patient had a PCN reaction occurring within the last 10 years: no If all of the above answers are "NO", then may proceed with Cephalosporin use.  Fever 105   . Sulfa Antibiotics   . Tylenol [Acetaminophen]     VITALS:  Blood pressure (!) 117/50, pulse 82, temperature 99.3 F (37.4 C), temperature source Oral, resp. rate 20, height 5\' 6"  (1.676 m), weight 84.5 kg, SpO2 93 %.  PHYSICAL EXAMINATION:  GENERAL:  72 y.o.-year-old patient lying in  the bed with no acute distress.  EYES: Pupils equal, round, reactive to light and accommodation. No scleral icterus. Extraocular muscles intact.  HEENT: Head atraumatic, normocephalic. Oropharynx and nasopharynx clear.  NECK:  Supple, no jugular venous distention. No thyroid enlargement, no tenderness.  LUNGS: Normal breath sounds bilaterally, no wheezing, rales,rhonchi or crepitation. No use of accessory muscles of respiration.  CARDIOVASCULAR: S1, S2 normal. No murmurs, rubs, or gallops.  ABDOMEN: Soft, nontender, nondistended. Bowel sounds present. No organomegaly or mass.  EXTREMITIES: No pedal edema, cyanosis, or clubbing.  NEUROLOGIC: Cranial nerves II through XII are intact. Muscle strength 5/5 in all extremities. Sensation intact. Gait not checked.  PSYCHIATRIC: [name].  SKIN: No obvious rash, lesion, or ulcer.   Physical Exam LABORATORY PANEL:   CBC Recent Labs  Lab 09/30/18 0207  WBC 13.0*  HGB 14.5  HCT 46.7*  PLT 313   ------------------------------------------------------------------------------------------------------------------  Chemistries  Recent Labs  Lab 09/30/18 0240  NA 138  K 4.2  CL 106  CO2 24  GLUCOSE 174*  BUN 10  CREATININE 0.60  CALCIUM 8.9  AST 13*  ALT 10  ALKPHOS 48  BILITOT 0.7   ------------------------------------------------------------------------------------------------------------------  Cardiac Enzymes Recent Labs  Lab 09/30/18 0240  TROPONINI <0.03   ------------------------------------------------------------------------------------------------------------------  RADIOLOGY:  Dg Chest 2 View  Result Date: 09/30/2018 CLINICAL DATA:  Cough, shortness of breath since yesterday. EXAM: CHEST - 2 VIEW COMPARISON:  Chest radiograph November 11, 2016 FINDINGS: Cardiac silhouette is mildly enlarged and unchanged. Calcified aortic arch. Similar pulmonary vascular congestion. Patchy lingular  consolidation without pleural effusion. No pneumothorax. Soft tissue planes and included osseous structures are non suspicious. IMPRESSION: 1. Lingular atelectasis versus pneumonia. 2. Stable cardiomegaly and pulmonary vascular congestion. 3.  Aortic Atherosclerosis (ICD10-I70.0). Electronically Signed   By: Awilda Metroourtnay  Bloomer M.D.   On: 09/30/2018 02:19    ASSESSMENT AND PLAN:  [name] admitted for pneumonia  *Acute hypoxic respiratory failure Most likely secondary to multifactorial process which includes community-acquired pneumonia and asthma exacerbation Supplemental oxygen with weaning as tolerated   *Acute  CAP Continue pneumonia protocol, empiric Levaquin, follow-up on cultures   *Acute asthmatic exacerbation IV Solu-Medrol with tapering as tolerated, mucolytic agent, antibiotics per above, schedule breathing treatments, and wean O2 off as tolerated  *Chronic pulmonary hypertension  Noted large VSD with goal blood pressure of 130 Restart Letairis Goal systolic blood pressure is greater than 130  *Chronic Atrial fibrillation Controlled on current regiment Continue sotalol and Coumadin  *Chronic Hypertension Stable Continue losartan  *CHF Stable Continue torsemide   Disposition Home in 1 to 2 days barring any complications  All the records are reviewed and case discussed with Care Management/Social Workerr. Management plans discussed with the patient, family and they are in agreement.  CODE STATUS: full  TOTAL TIME TAKING CARE OF THIS PATIENT: 40 minutes.     POSSIBLE D/C IN 1-3 DAYS, DEPENDING ON CLINICAL CONDITION.   Evelena AsaMontell D Kripa Foskey M.D on 10/01/2018   Between 7am to 6pm - Pager - [phone]  After 6pm go to [email or web address] - password [email or web address]  [facility]  Office  [phone]  CC: Primary care physician; [place]errilyn [place], [facility]  Note: This dictation was prepared with Dragon dictation along with smaller phrase  technology. Any transcriptional errors that result from this process are unintentional.

## 2018-10-01 NOTE — Consult Note (Signed)
ANTICOAGULATION CONSULT NOTE   Pharmacy Consult for Warfarin dosing Indication: atrial fibrillation  Allergies  Allergen Reactions  . Penicillins Other (See Comments)    Has patient had a PCN reaction causing immediate rash, facial/tongue/throat swelling, SOB or lightheadedness with hypotension: no Has patient had a PCN reaction causing severe rash involving mucus membranes or skin necrosis: no Has patient had a PCN reaction that required hospitalization no Has patient had a PCN reaction occurring within the last 10 years: no If all of the above answers are "NO", then may proceed with Cephalosporin use.  Fever 105   . Sulfa Antibiotics   . Tylenol [Acetaminophen]     Patient Measurements: Height: 5\' 6"  (167.6 cm) Weight: 186 lb 3.2 oz (84.5 kg) IBW/kg (Calculated) : 59.3  Vital Signs: BP: 129/58 (11/16 0352) Pulse Rate: 72 (11/16 0352)  Labs: Recent Labs    09/30/18 0207 09/30/18 0240 09/30/18 1257 10/01/18 0500  HGB 14.5  --   --   --   HCT 46.7*  --   --   --   PLT 313  --   --   --   LABPROT  --   --  23.7* 24.3*  INR  --   --  2.15 2.22  CREATININE  --  0.60  --   --   TROPONINI  --  <0.03  --   --     Estimated Creatinine Clearance: 70.7 mL/min (by C-G formula based on SCr of 0.6 mg/dL).   Medical History: Past Medical History:  Diagnosis Date  . Anxiety   . Atrial fibrillation ([facility])   . CHF (congestive heart failure) ([facility])   . GERD (gastroesophageal reflux disease)   . HLD (hyperlipidemia)   . Hypertension   . Pulmonary hypertension ([facility])   . VSD (ventricular septal defect)     Medications:  Medications Prior to Admission  Medication Sig Dispense Refill Last Dose  . ambrisentan (LETAIRIS) 5 MG tablet Take 5 mg by mouth daily.   09/29/2018 at Unknown time  . hydrochlorothiazide (MICROZIDE) 12.5 MG capsule Take 12.5 mg by mouth daily.     Marland Kitchen losartan (COZAAR) 50 MG tablet Take 50 mg by mouth daily.   09/29/2018 at Unknown time  . sotalol (BETAPACE)  120 MG tablet Take 120 mg by mouth 2 (two) times daily.   09/29/2018 at Unknown time  . warfarin (COUMADIN) 6 MG tablet Take 6 mg by mouth daily. 6 mg Mon, Wed, Fri, Sat    Then 7 mg on Sun, Tue, Thur   09/29/2018 at Unknown time  . aspirin EC 81 MG tablet Take 81 mg by mouth daily.   Not Taking at Unknown time  . diazepam (VALIUM) 2 MG tablet Take 2 mg by mouth every 6 (six) hours as needed for anxiety.   prn  . levofloxacin (LEVAQUIN) 500 MG tablet Take 1 tablet (500 mg total) by mouth daily. (Patient not taking: Reported on 09/30/2018) 5 tablet 0 Not Taking  . torsemide (DEMADEX) 10 MG tablet Take 10 mg by mouth as needed.   Not Taking at Unknown time    Assessment: 72 yo female with past medical history of pulmonary hypertension secondary to a large VSD, atrial fibrillation and CHF.  Patient on Levofloxacin 750 mg once daily for PNA. Levofloxacin can potentially elevate the patient's INR.  PTA dosing for Warfarin: 6 mg MWFSat 7 mg SunTueThurs  Goal of Therapy:  INR 2-3 Monitor platelets by anticoagulation protocol: Yes   Plan:  INR 2.22 Will order warfarin 6 mg once at 1800. Will monitor INR daily and dose warfarin appropriately.  Possible INR elevation due to current Levofloxacin therapy.   Stormy CardKatsoudas,Livia Tarr K, [place] [facility] Rivers Medical CenterRPH Clinical Pharmacist 10/01/2018,8:01 AM

## 2018-10-01 NOTE — Progress Notes (Signed)
Pt ambulated to BR with SB+ and tolerated well. Continues to be on high flo 02. Started mucinex and solumedrol. Has had low grade fever. Husband in. No acute distress.

## 2018-10-02 LAB — HEMOGLOBIN AND HEMATOCRIT, BLOOD
HCT: 43.6 % (ref 36.0–46.0)
HCT: 43.6 % (ref 36.0–46.0)
Hemoglobin: 13.4 g/dL (ref 12.0–15.0)
Hemoglobin: 13.2 g/dL (ref 12.0–15.0)

## 2018-10-02 LAB — PROTIME-INR
INR: 2.21
Prothrombin Time: 24.2 seconds — ABNORMAL HIGH (ref 11.4–15.2)

## 2018-10-02 MED ORDER — IPRATROPIUM-ALBUTEROL 0.5-2.5 (3) MG/3ML IN SOLN
3.0000 mL | Freq: Three times a day (TID) | RESPIRATORY_TRACT | Status: DC
  Administered 2018-10-02 – 2018-10-04 (×6): 3 mL via RESPIRATORY_TRACT
  Filled 2018-10-02 (×7): qty 3

## 2018-10-02 MED ORDER — WARFARIN SODIUM 6 MG PO TABS
6.0000 mg | ORAL_TABLET | Freq: Every day | ORAL | Status: DC
  Filled 2018-10-02: qty 1

## 2018-10-02 MED ORDER — AMBRISENTAN 5 MG PO TABS: 5.0000 mg | ORAL_TABLET | Freq: Every day | ORAL | Status: DC

## 2018-10-02 MED ORDER — AMBRISENTAN 5 MG PO TABS
5.0000 mg | ORAL_TABLET | Freq: Every day | ORAL | Status: DC
  Administered 2018-10-02 – 2018-10-03 (×2): 5 mg via ORAL
  Filled 2018-10-02 (×4): qty 1

## 2018-10-02 NOTE — Progress Notes (Signed)
[employer] - [place] at [facility]   PATIENT NAME: Haley Adams    MR#:  [id number]  DATE OF BIRTH:  06/10/1946  SUBJECTIVE:  CHIEF COMPLAINT:   Chief Complaint  Patient presents with  . Cough  . Shortness of Breath  Patient feeling better, complains of coughing up blood now, patient refusing Solu-Medrol-we will discontinue, hold Coumadin, continue to wean O2 off as tolerated   REVIEW OF SYSTEMS:  CONSTITUTIONAL: No fever, fatigue or weakness.  EYES: No blurred or double vision.  EARS, NOSE, AND THROAT: No tinnitus or ear pain.  RESPIRATORY: No cough, shortness of breath, wheezing or hemoptysis.  CARDIOVASCULAR: No chest pain, orthopnea, edema.  GASTROINTESTINAL: No nausea, vomiting, diarrhea or abdominal pain.  GENITOURINARY: No dysuria, hematuria.  ENDOCRINE: No polyuria, nocturia,  HEMATOLOGY: No anemia, easy bruising or bleeding SKIN: No rash or lesion. MUSCULOSKELETAL: No joint pain or arthritis.   NEUROLOGIC: No tingling, numbness, weakness.  PSYCHIATRY: No anxiety or depression.   ROS  DRUG ALLERGIES:   Allergies  Allergen Reactions  . Haley Adams Other (See Comments)    Has patient had a PCN reaction causing immediate rash, facial/tongue/throat swelling, SOB or lightheadedness with hypotension: no Has patient had a PCN reaction causing severe rash involving mucus membranes or skin necrosis: no Has patient had a PCN reaction that required hospitalization no Has patient had a PCN reaction occurring within the last 10 years: no If all of the above answers are "NO", then may proceed with Cephalosporin use.  Fever 105   . Haley Adams   . Haley [Acetaminophen]     VITALS:  Blood pressure 140/69, pulse 81, temperature 98.8 F (37.1 C), temperature source Oral, resp. rate 17, height 5\' 6"  (1.676 m), weight 84.1 kg, SpO2 92 %.  PHYSICAL EXAMINATION:  GENERAL:  72 y.o.-year-old patient lying in the bed with no acute distress.  EYES:  Pupils equal, round, reactive to light and accommodation. No scleral icterus. Extraocular muscles intact.  HEENT: Head atraumatic, normocephalic. Oropharynx and nasopharynx clear.  NECK:  Supple, no jugular venous distention. No thyroid enlargement, no tenderness.  LUNGS: Normal breath sounds bilaterally, no wheezing, rales,rhonchi or crepitation. No use of accessory muscles of respiration.  CARDIOVASCULAR: S1, S2 normal. No murmurs, rubs, or gallops.  ABDOMEN: Soft, nontender, nondistended. Bowel sounds present. No organomegaly or mass.  EXTREMITIES: No pedal edema, cyanosis, or clubbing.  NEUROLOGIC: Cranial nerves II through XII are intact. Muscle strength 5/5 in all extremities. Sensation intact. Gait not checked.  PSYCHIATRIC: [name].  SKIN: No obvious rash, lesion, or ulcer.   Physical Exam LABORATORY PANEL:   CBC Recent Labs  Lab 09/30/18 0207  WBC 13.0*  HGB 14.5  HCT 46.7*  PLT 313   ------------------------------------------------------------------------------------------------------------------  Chemistries  Recent Labs  Lab 09/30/18 0240  NA 138  K 4.2  CL 106  CO2 24  GLUCOSE 174*  BUN 10  CREATININE 0.60  CALCIUM 8.9  AST 13*  ALT 10  ALKPHOS 48  BILITOT 0.7   ------------------------------------------------------------------------------------------------------------------  Cardiac Enzymes Recent Labs  Lab 09/30/18 0240  TROPONINI <0.03   ------------------------------------------------------------------------------------------------------------------  RADIOLOGY:  No results found.  ASSESSMENT AND PLAN:  [name] admitted for pneumonia  *Acute hypoxic respiratory failure Resolving Most likely secondary to multifactorial process which includes community-acquired pneumonia and asthma exacerbation Supplemental oxygen with weaning as tolerated -on 4 L via nasal cannula at night  chronically  *Acute  CAP Resolving Continue  pneumonia protocol, empiric Levaquin, follow-up on cultures   *Acute asthmatic exacerbation Resolving Patient refusing Solu-Medrol-we will discontinue, continue mucolytic agent, Adams per above, schedule breathing treatments, inhaled corticosteroids twice daily, and wean O2 off as tolerated  *Acute hemoptysis Likely secondary to pneumonia, compounded by Coumadin use Hold Coumadin, avoid antiplatelet agents for now, H&H every 6 hours, CBC daily, transfuse if needed  *Chronic pulmonary hypertension  Noted large VSD with goal blood pressure of 130 Restart Letairis Goal systolic blood pressure is greater than 130  *Chronic Atrial fibrillation Controlled on current regiment Continue sotalol Coumadin on hold for hemoptysis  *Chronic Hypertension Stable Continue losartan  *CHF Stable Continue torsemide  Disposition Home in 1 to 2 days barring any complications  All the records are reviewed and case discussed with Care Management/Social Workerr. Management plans discussed with the patient, family and they are in agreement.  CODE STATUS: full  TOTAL TIME TAKING CARE OF THIS PATIENT: 40 minutes.     POSSIBLE D/C IN 1-3 DAYS, DEPENDING ON CLINICAL CONDITION.   Haley Adams M.D on 10/02/2018   Between 7am to 6pm - Pager - [phone]  After 6pm go to [email or web address] - password [email or web address]  [facility] [place] [facility]  Office  [phone]  CC: Primary care physician; Haley Adams, [facility]  Note: This dictation was prepared with Dragon dictation along with smaller phrase technology. Any transcriptional errors that result from this process are unintentional.

## 2018-10-02 NOTE — Consult Note (Signed)
ANTICOAGULATION CONSULT NOTE   Pharmacy Consult for Warfarin dosing Indication: atrial fibrillation  Allergies  Allergen Reactions  . Penicillins Other (See Comments)    Has patient had a PCN reaction causing immediate rash, facial/tongue/throat swelling, SOB or lightheadedness with hypotension: no Has patient had a PCN reaction causing severe rash involving mucus membranes or skin necrosis: no Has patient had a PCN reaction that required hospitalization no Has patient had a PCN reaction occurring within the last 10 years: no If all of the above answers are "NO", then may proceed with Cephalosporin use.  Fever 105   . Sulfa Antibiotics   . Tylenol [Acetaminophen]     Patient Measurements: Height: 5\' 6"  (167.6 cm) Weight: 185 lb 6.5 oz (84.1 kg) IBW/kg (Calculated) : 59.3  Vital Signs: Temp: 98.2 F (36.8 C) (11/17 0358) Temp Source: Oral (11/17 0358) BP: 139/72 (11/17 0358) Pulse Rate: 75 (11/17 0358)  Labs: Recent Labs    09/30/18 0207 09/30/18 0240 09/30/18 1257 10/01/18 0500 10/02/18 0320  HGB 14.5  --   --   --   --   HCT 46.7*  --   --   --   --   PLT 313  --   --   --   --   LABPROT  --   --  23.7* 24.3* 24.2*  INR  --   --  2.15 2.22 2.21  CREATININE  --  0.60  --   --   --   TROPONINI  --  <0.03  --   --   --     Estimated Creatinine Clearance: 70.5 mL/min (by C-G formula based on SCr of 0.6 mg/dL).   Medical History: Past Medical History:  Diagnosis Date  . Anxiety   . Atrial fibrillation (HCC)   . CHF (congestive heart failure) (HCC)   . GERD (gastroesophageal reflux disease)   . HLD (hyperlipidemia)   . Hypertension   . Pulmonary hypertension (HCC)   . VSD (ventricular septal defect)     Medications:  Medications Prior to Admission  Medication Sig Dispense Refill Last Dose  . ambrisentan (LETAIRIS) 5 MG tablet Take 5 mg by mouth daily.   09/29/2018 at Unknown time  . hydrochlorothiazide (MICROZIDE) 12.5 MG capsule Take 12.5 mg by mouth  daily.     Marland Kitchen losartan (COZAAR) 50 MG tablet Take 50 mg by mouth daily.   09/29/2018 at Unknown time  . sotalol (BETAPACE) 120 MG tablet Take 120 mg by mouth 2 (two) times daily.   09/29/2018 at Unknown time  . warfarin (COUMADIN) 6 MG tablet Take 6 mg by mouth daily. 6 mg Mon, Wed, Fri, Sat    Then 7 mg on Sun, Tue, Thur   09/29/2018 at Unknown time  . aspirin EC 81 MG tablet Take 81 mg by mouth daily.   Not Taking at Unknown time  . diazepam (VALIUM) 2 MG tablet Take 2 mg by mouth every 6 (six) hours as needed for anxiety.   prn  . levofloxacin (LEVAQUIN) 500 MG tablet Take 1 tablet (500 mg total) by mouth daily. (Patient not taking: Reported on 09/30/2018) 5 tablet 0 Not Taking  . torsemide (DEMADEX) 10 MG tablet Take 10 mg by mouth as needed.   Not Taking at Unknown time    Assessment: 72 yo female with past medical history of pulmonary hypertension secondary to a large VSD, atrial fibrillation and CHF.  Patient on Levofloxacin 750 mg once daily for PNA. Levofloxacin can potentially elevate  the patient's INR.  PTA dosing for Warfarin: 6 mg MWFSat 7 mg SunTueThurs  Goal of Therapy:  INR 2-3 Monitor platelets by anticoagulation protocol: Yes   Plan:  INR 2.22 Will order warfarin 6 mg daily at 1800. Will monitor INR daily and dose warfarin appropriately.  Possible INR elevation due to current Levofloxacin therapy.   Stormy CardKatsoudas,Ricco Dershem K, [facility] AnaheimRPH Clinical Pharmacist 10/02/2018,7:15 AM

## 2018-10-03 LAB — PROTIME-INR
INR: 2.21
Prothrombin Time: 24.2 seconds — ABNORMAL HIGH (ref 11.4–15.2)

## 2018-10-03 LAB — CBC
HCT: 43.3 % (ref 36.0–46.0)
Hemoglobin: 13.6 g/dL (ref 12.0–15.0)
MCH: 28 pg (ref 26.0–34.0)
MCHC: 31.4 g/dL (ref 30.0–36.0)
MCV: 89.1 fL (ref 80.0–100.0)
PLATELETS: 269 10*3/uL (ref 150–400)
RBC: 4.86 MIL/uL (ref 3.87–5.11)
RDW: 16.2 % — AB (ref 11.5–15.5)
WBC: 20.6 10*3/uL — AB (ref 4.0–10.5)
nRBC: 0.1 % (ref 0.0–0.2)

## 2018-10-03 LAB — PROCALCITONIN: PROCALCITONIN: 0.29 ng/mL

## 2018-10-03 MED ORDER — SALINE SPRAY 0.65 % NA SOLN
1.0000 | NASAL | Status: DC | PRN
  Filled 2018-10-03: qty 44

## 2018-10-03 MED ORDER — PREDNISONE 50 MG PO TABS
50.0000 mg | ORAL_TABLET | Freq: Every day | ORAL | Status: DC
  Administered 2018-10-04: 50 mg via ORAL
  Filled 2018-10-03: qty 1

## 2018-10-03 MED ORDER — ACETYLCYSTEINE 20 % IN SOLN
4.0000 mL | Freq: Two times a day (BID) | RESPIRATORY_TRACT | Status: DC
  Administered 2018-10-03 – 2018-10-04 (×2): 4 mL via RESPIRATORY_TRACT
  Filled 2018-10-03 (×2): qty 4

## 2018-10-03 NOTE — Progress Notes (Signed)
[employer] - [place] at [facility]   PATIENT NAME: Haley ChesterCynthia Adams    MR#:  [id number]  DATE OF BIRTH:  12-22-1945  SUBJECTIVE:  CHIEF COMPLAINT:   Chief Complaint  Patient presents with  . Cough  . Shortness of Breath   Patient breathing continues to improve had a nasal bleed.  Which is now resolved   REVIEW OF SYSTEMS:  CONSTITUTIONAL: No fever, fatigue or weakness.  EYES: No blurred or double vision.  EARS, NOSE, AND THROAT: No tinnitus or ear pain.  Positive nasal bleed RESPIRATORY: Positive cough, shortness of breath, wheezing or hemoptysis.  CARDIOVASCULAR: No chest pain, orthopnea, edema.  GASTROINTESTINAL: No nausea, vomiting, diarrhea or abdominal pain.  GENITOURINARY: No dysuria, hematuria.  ENDOCRINE: No polyuria, nocturia,  HEMATOLOGY: No anemia, easy bruising or bleeding SKIN: No rash or lesion. MUSCULOSKELETAL: No joint pain or arthritis.   NEUROLOGIC: No tingling, numbness, weakness.  PSYCHIATRY: No anxiety or depression.   ROS  DRUG ALLERGIES:   Allergies  Allergen Reactions  . Penicillins Other (See Comments)    Has patient had a PCN reaction causing immediate rash, facial/tongue/throat swelling, SOB or lightheadedness with hypotension: no Has patient had a PCN reaction causing severe rash involving mucus membranes or skin necrosis: no Has patient had a PCN reaction that required hospitalization no Has patient had a PCN reaction occurring within the last 10 years: no If all of the above answers are "NO", then may proceed with Cephalosporin use.  Fever 105   . Sulfa Antibiotics   . Tylenol [Acetaminophen]     VITALS:  Blood pressure 134/60, pulse 65, temperature 98.1 F (36.7 C), temperature source Oral, resp. rate 18, height 5\' 6"  (1.676 m), weight 85.1 kg, SpO2 95 %.  PHYSICAL EXAMINATION:  GENERAL:  72 y.o.-year-old patient lying in the bed with no acute distress.  EYES: Pupils equal, round, reactive to light and  accommodation. No scleral icterus. Extraocular muscles intact.  HEENT: Head atraumatic, normocephalic. Oropharynx and nasopharynx clear.  NECK:  Supple, no jugular venous distention. No thyroid enlargement, no tenderness.  LUNGS: Normal breath sounds bilaterally, no wheezing, rales,rhonchi or crepitation. No use of accessory muscles of respiration.  CARDIOVASCULAR: S1, S2 normal. No murmurs, rubs, or gallops.  ABDOMEN: Soft, nontender, nondistended. Bowel sounds present. No organomegaly or mass.  EXTREMITIES: No pedal edema, cyanosis, or clubbing.  NEUROLOGIC: Cranial nerves II through XII are intact. Muscle strength 5/5 in all extremities. Sensation intact. Gait not checked.  PSYCHIATRIC: [name].  SKIN: No obvious rash, lesion, or ulcer.   Physical Exam LABORATORY PANEL:   CBC Recent Labs  Lab 10/03/18 0541  WBC 20.6*  HGB 13.6  HCT 43.3  PLT 269   ------------------------------------------------------------------------------------------------------------------  Chemistries  Recent Labs  Lab 09/30/18 0240  NA 138  K 4.2  CL 106  CO2 24  GLUCOSE 174*  BUN 10  CREATININE 0.60  CALCIUM 8.9  AST 13*  ALT 10  ALKPHOS 48  BILITOT 0.7   ------------------------------------------------------------------------------------------------------------------  Cardiac Enzymes Recent Labs  Lab 09/30/18 0240  TROPONINI <0.03   ------------------------------------------------------------------------------------------------------------------  RADIOLOGY:  No results found.  ASSESSMENT AND PLAN:  [name] admitted for pneumonia  *Acute hypoxic respiratory failure Improved secondary to multifactorial process which includes community-acquired pneumonia and asthma exacerbation Wean oxygen   *Acute  CAP Resolving, obtain sputum cultures Continue Levaquin And Mucomyst  *Acute asthmatic exacerbation Resolving Continue  neb Add oral prednisone patient agreeable to  that  *Acute hemoptysis Likely secondary to pneumonia, compounded by Coumadin use Hold Coumadin, avoid antiplatelet agents for now, H&H every 6 hours, CBC daily, transfuse if needed  *Chronic pulmonary hypertension  Noted large VSD with goal blood pressure of 130 Restart Letairis Goal systolic blood pressure is greater than 130  *Chronic Atrial fibrillation Controlled on current regiment Continue sotalol Coumadin on hold for hemoptysis and nasal bleed  *Chronic Hypertension Stable Continue losartan  *CHF Stable Continue torsemide  *Nasal bleed Hold Coumadin today   All the records are reviewed and case discussed with Care Management/Social Workerr. Management plans discussed with the patient, family and they are in agreement.  CODE STATUS: full  TOTAL TIME TAKING CARE OF THIS PATIENT: .     POSSIBLE D/C IN 1-3 DAYS, DEPENDING ON CLINICAL CONDITION.   Auburn Bilberry M.D on 10/03/2018   Between 7am to 6pm - Pager - [phone]  After 6pm go to [email or web address] - password [email or web address]  [facility] [place] [facility]  Office  [phone]  CC: Primary care physician; [facility]  Note: This dictation was prepared with Dragon dictation along with smaller phrase technology. Any transcriptional errors that result from this process are unintentional.

## 2018-10-03 NOTE — Progress Notes (Signed)
Pharmacy Antibiotic Note  Haley ChesterCynthia Adams is a 72 y.o. female admitted on 09/30/2018 with pneumonia.  Pharmacy has been consulted for Levaquin dosing.  Plan: Continue Levaquin 750 mg IV q 24 hours ordered  Height: 5\' 6"  (167.6 cm) Weight: 187 lb 11.2 oz (85.1 kg) IBW/kg (Calculated) : 59.3  Temp (24hrs), Avg:98.7 F (37.1 C), Min:98.3 F (36.8 C), Max:98.9 F (37.2 C)  Recent Labs  Lab 09/30/18 0207 09/30/18 0240 10/03/18 0541  WBC 13.0*  --  20.6*  CREATININE  --  0.60  --     Estimated Creatinine Clearance: 70.9 mL/min (by C-G formula based on SCr of 0.6 mg/dL).    Allergies  Allergen Reactions  . Penicillins Other (See Comments)    Has patient had a PCN reaction causing immediate rash, facial/tongue/throat swelling, SOB or lightheadedness with hypotension: no Has patient had a PCN reaction causing severe rash involving mucus membranes or skin necrosis: no Has patient had a PCN reaction that required hospitalization no Has patient had a PCN reaction occurring within the last 10 years: no If all of the above answers are "NO", then may proceed with Cephalosporin use.  Fever 105   . Sulfa Antibiotics   . Tylenol [Acetaminophen]     Antimicrobials this admission: Levaquin 11/15  >>    >>   Dose adjustments this admission:   Microbiology results: none  Thank you for allowing pharmacy to be a part of this patient's care.  Marty HeckWang, Lilienne Weins L 10/03/2018 7:52 AM

## 2018-10-03 NOTE — Care Management Important Message (Signed)
Important Message  Patient Details  Name: Haley Adams MRN: [id number] Date of Birth: 09-Oct-1946   Medicare Important Message Given:  Yes    Olegario MessierKathy A Sheri Prows 10/03/2018, 10:50 AM

## 2018-10-03 NOTE — Consult Note (Signed)
ANTICOAGULATION CONSULT NOTE   Pharmacy Consult for Warfarin dosing Indication: atrial fibrillation  Allergies  Allergen Reactions  . Penicillins Other (See Comments)    Has patient had a PCN reaction causing immediate rash, facial/tongue/throat swelling, SOB or lightheadedness with hypotension: no Has patient had a PCN reaction causing severe rash involving mucus membranes or skin necrosis: no Has patient had a PCN reaction that required hospitalization no Has patient had a PCN reaction occurring within the last 10 years: no If all of the above answers are "NO", then may proceed with Cephalosporin use.  Fever 105   . Sulfa Antibiotics   . Tylenol [Acetaminophen]     Patient Measurements: Height: 5\' 6"  (167.6 cm) Weight: 187 lb 11.2 oz (85.1 kg) IBW/kg (Calculated) : 59.3  Vital Signs: Temp: 98.2 F (36.8 C) (11/18 0758) Temp Source: Oral (11/18 0758) BP: 140/77 (11/18 0758) Pulse Rate: 66 (11/18 0758)  Labs: Recent Labs    10/01/18 0500 10/02/18 0320  10/02/18 1415 10/02/18 2130 10/03/18 0541  HGB  --   --    < > 13.4 13.2 13.6  HCT  --   --   --  43.6 43.6 43.3  PLT  --   --   --   --   --  269  LABPROT 24.3* 24.2*  --   --   --  24.2*  INR 2.22 2.21  --   --   --  2.21   < > = values in this interval not displayed.    Estimated Creatinine Clearance: 70.9 mL/min (by C-G formula based on SCr of 0.6 mg/dL).   Medical History: Past Medical History:  Diagnosis Date  . Anxiety   . Atrial fibrillation (HCC)   . CHF (congestive heart failure) (HCC)   . GERD (gastroesophageal reflux disease)   . HLD (hyperlipidemia)   . Hypertension   . Pulmonary hypertension (HCC)   . VSD (ventricular septal defect)     Assessment: 72 yo female with past medical history of pulmonary hypertension secondary to a large VSD, atrial fibrillation and CHF.  Patient on Levofloxacin 750 mg once daily for PNA. Levofloxacin can potentially elevate the patient's INR.  PTA dosing  for Warfarin: 6 mg MWFSat 7 mg SunTueThurs  Goal of Therapy:  INR 2-3 Monitor platelets by anticoagulation protocol: Yes   Plan:  INR 2.21 Per MD hold warfarin today - pt with significant nose bleed per RN.  Will monitor INR daily; CBC already ordered for AM.    Pharmacy will continue to follow.   Marty HeckWang, Riana Tessmer L, PharmD, BCPS Clinical Pharmacist 10/03/2018,11:16 AM

## 2018-10-03 NOTE — Plan of Care (Signed)

## 2018-10-04 LAB — BASIC METABOLIC PANEL
ANION GAP: 6 (ref 5–15)
BUN: 18 mg/dL (ref 8–23)
CHLORIDE: 107 mmol/L (ref 98–111)
CO2: 26 mmol/L (ref 22–32)
Calcium: 8.4 mg/dL — ABNORMAL LOW (ref 8.9–10.3)
Creatinine, Ser: 0.67 mg/dL (ref 0.44–1.00)
GFR calc Af Amer: >60 mL/min (ref >60)
GFR calc non Af Amer: >60 mL/min (ref >60)
Glucose, Bld: 98 mg/dL (ref 70–99)
POTASSIUM: 4.3 mmol/L (ref 3.5–5.1)
SODIUM: 139 mmol/L (ref 135–145)

## 2018-10-04 LAB — CBC
HCT: 46 % (ref 36.0–46.0)
HEMOGLOBIN: 14.1 g/dL (ref 12.0–15.0)
MCH: 27.9 pg (ref 26.0–34.0)
MCHC: 30.7 g/dL (ref 30.0–36.0)
MCV: 90.9 fL (ref 80.0–100.0)
Platelets: 260 10*3/uL (ref 150–400)
RBC: 5.06 MIL/uL (ref 3.87–5.11)
RDW: 16.5 % — ABNORMAL HIGH (ref 11.5–15.5)
WBC: 12.5 10*3/uL — AB (ref 4.0–10.5)
nRBC: 0 % (ref 0.0–0.2)

## 2018-10-04 LAB — EXPECTORATED SPUTUM ASSESSMENT W REFEX TO RESP CULTURE

## 2018-10-04 LAB — EXPECTORATED SPUTUM ASSESSMENT W GRAM STAIN, RFLX TO RESP C: Special Requests: NORMAL

## 2018-10-04 LAB — PROTIME-INR
INR: 1.7
Prothrombin Time: 19.8 seconds — ABNORMAL HIGH (ref 11.4–15.2)

## 2018-10-04 MED ORDER — GUAIFENESIN ER 600 MG PO TB12: 600.0000 mg | ORAL_TABLET | Freq: Two times a day (BID) | ORAL | 0 refills | Status: AC

## 2018-10-04 MED ORDER — SALINE SPRAY 0.65 % NA SOLN: 1.0000 | NASAL | 0 refills | Status: AC | PRN

## 2018-10-04 MED ORDER — LEVOFLOXACIN 750 MG PO TABS: 750.0000 mg | ORAL_TABLET | Freq: Every day | ORAL | 0 refills | Status: AC

## 2018-10-04 NOTE — Discharge Summary (Signed)
[employer] - [place] at [facility], 72 y.o., DOB 07-Aug-1946, MRN [id number]. Admission date: 09/30/2018 Discharge Date 10/04/2018 Primary MD Jerrilyn [facility] Admitting Physician Arnaldo Natal, MD  Admission Diagnosis  Cough  Discharge Diagnosis   Active Problems:   CAP (community acquired pneumonia) Acute asthma exasperation Acute hemoptysis due to coughing Chronic pulmonary hypertension Chronic atrial fibrillation Essential hypertension CHF Nasal bleed  Hospital Course  Patient 72 year old female with history of pulmonary hypertension, essential hypertension and atrial fibrillation presented to the hospital with cough shortness of breath.  She was evaluated and noted to have pneumonia.  She was treated with antibiotics.  She started feeling better.  Also had some hemoptysis which she is still having some dark-colored sputum.  She also had a nasal bleed therefore her Coumadin is currently being held.  I have instructed to resume Coumadin day after once her sputum clears up.  Patient also uses chronic nocturnal oxygen due to her pulmonary hypertension.            Consults  None  Significant Tests:  See full reports for all details     Dg Chest 2 View  Result Date: 09/30/2018 CLINICAL DATA:  Cough, shortness of breath since yesterday. EXAM: CHEST - 2 VIEW COMPARISON:  Chest radiograph November 11, 2016 FINDINGS: Cardiac silhouette is mildly enlarged and unchanged. Calcified aortic arch. Similar pulmonary vascular congestion. Patchy lingular consolidation without pleural effusion. No pneumothorax. Soft tissue planes and included osseous structures are non suspicious. IMPRESSION: 1. Lingular atelectasis versus pneumonia. 2. Stable cardiomegaly and pulmonary vascular congestion. 3.  Aortic Atherosclerosis (ICD10-I70.0). Electronically Signed   By: Awilda Metro M.D.   On: 09/30/2018 02:19       Today   Subjective:    Haley Adams patient feeling much better breathing much improved Objective:   Blood pressure 134/60, pulse 83, temperature 98.3 F (36.8 C), temperature source Oral, resp. rate 14, height 5\' 6"  (1.676 m), weight 85.1 kg, SpO2 93 %.  .  Intake/Output Summary (Last 24 hours) at 10/04/2018 1310 Last data filed at 10/04/2018 0937 Gross per 24 hour  Intake 480 ml  Output -  Net 480 ml    Exam VITAL SIGNS: Blood pressure 134/60, pulse 83, temperature 98.3 F (36.8 C), temperature source Oral, resp. rate 14, height 5\' 6"  (1.676 m), weight 85.1 kg, SpO2 93 %.  GENERAL:  72 y.o.-year-old patient lying in the bed with no acute distress.  EYES: Pupils equal, round, reactive to light and accommodation. No scleral icterus. Extraocular muscles intact.  HEENT: Head atraumatic, normocephalic. Oropharynx and nasopharynx clear.  NECK:  Supple, no jugular venous distention. No thyroid enlargement, no tenderness.  LUNGS: Normal breath sounds bilaterally, no wheezing, rales,rhonchi or crepitation. No use of accessory muscles of respiration.  CARDIOVASCULAR: S1, S2 normal. No murmurs, rubs, or gallops.  ABDOMEN: Soft, nontender, nondistended. Bowel sounds present. No organomegaly or mass.  EXTREMITIES: No pedal edema, cyanosis, or clubbing.  NEUROLOGIC: Cranial nerves II through XII are intact. Muscle strength 5/5 in all extremities. Sensation intact. Gait not checked.  PSYCHIATRIC: [name].  SKIN: No obvious rash, lesion, or ulcer.   Data Review     CBC w Diff:  Lab Results  Component Value Date   WBC 12.5 (H) 10/04/2018   HGB 14.1 10/04/2018   HGB 13.8 02/03/2013   HCT 46.0 10/04/2018   HCT 41.4 02/03/2013   PLT 260 10/04/2018   PLT  212 02/03/2013   LYMPHOPCT 8 11/11/2016   LYMPHOPCT 39.9 02/03/2013   MONOPCT 9 11/11/2016   MONOPCT 10.7 02/03/2013   EOSPCT 0 11/11/2016   EOSPCT 2.5 02/03/2013   BASOPCT 1 11/11/2016   BASOPCT 1.0 02/03/2013   CMP:   Lab Results  Component Value Date   NA 139 10/04/2018   NA 142 02/03/2013   K 4.3 10/04/2018   K 3.9 02/03/2013   CL 107 10/04/2018   CL 110 (H) 02/03/2013   CO2 26 10/04/2018   CO2 25 02/03/2013   BUN 18 10/04/2018   BUN 14 02/03/2013   CREATININE 0.67 10/04/2018   CREATININE 0.60 02/03/2013   PROT 7.1 09/30/2018   PROT 7.6 02/02/2013   ALBUMIN 3.9 09/30/2018   ALBUMIN 3.7 02/02/2013   BILITOT 0.7 09/30/2018   BILITOT 0.4 02/02/2013   ALKPHOS 48 09/30/2018   ALKPHOS 58 02/02/2013   AST 13 (L) 09/30/2018   AST 19 02/02/2013   ALT 10 09/30/2018   ALT 23 02/02/2013  .  Micro Results Recent Results (from the past 240 hour(s))  Expectorated sputum assessment w rflx to resp cult     Status: None   Collection Time: 10/03/18  8:59 PM  Result Value Ref Range Status   Specimen Description EXPECTORATED SPUTUM  Final   Special Requests Normal  Final   Sputum evaluation   Final    THIS SPECIMEN IS ACCEPTABLE FOR SPUTUM CULTURE Performed at [facility], [address] [place], [place] [address]    Report Status 10/04/2018 FINAL  Final  Culture, respiratory     Status: None (Preliminary result)   Collection Time: 10/03/18  8:59 PM  Result Value Ref Range Status   Specimen Description   Final    EXPECTORATED SPUTUM Performed at [facility], [address] [place], [place] [address]    Special Requests   Final    Normal Reflexed from [id number] Performed at [facility], [address] [place], [place] [address]    Gram Stain   Final    MODERATE WBC PRESENT,BOTH PMN AND MONONUCLEAR ABUNDANT GRAM POSITIVE COCCI FEW GRAM NEGATIVE RODS FEW GRAM POSITIVE RODS Performed at [facility], [address] [place], [place] [address]    Culture PENDING  Incomplete   Report Status PENDING  Incomplete        Code Status Orders  (From admission, onward)         Start     Ordered   09/30/18 0452  Full code  Continuous     09/30/18 0451         Code Status History    Date Active Date Inactive Code Status Order ID Comments User Context   11/12/2016 0215 11/13/2016 1824 Full Code [id number]  Oralia ManisWillis, David, MD ED          Follow-up Information    [place], [facility]. Go on 10/10/2018.   Why:  @ 9:40 a.m. Contact information: [address] [place] [address] [phone]        primary pulmonlogist at [facility] In 2 weeks.   Contact information: [facility]  [address] [facility] [address]  [address]  [place], [place] [address]  [phone]           Discharge Medications   Allergies as of 10/04/2018      Reactions   Penicillins Other (See Comments)   Has patient had a PCN reaction causing immediate rash,  facial/tongue/throat swelling, SOB or lightheadedness with hypotension: no Has patient had a PCN reaction causing severe rash involving mucus membranes or skin necrosis: no Has patient had a PCN reaction that required hospitalization no Has patient had a PCN reaction occurring within the last 10 years: no If all of the above answers are "NO", then may proceed with Cephalosporin use. Fever 105    Sulfa Antibiotics    Tylenol [acetaminophen]       Medication List    STOP taking these medications   warfarin 6 MG tablet Commonly known as:  COUMADIN     TAKE these medications   aspirin EC 81 MG tablet Take 81 mg by mouth daily.   diazepam 2 MG tablet Commonly known as:  VALIUM Take 2 mg by mouth every 6 (six) hours as needed for anxiety.   guaiFENesin 600 MG 12 hr tablet Commonly known as:  MUCINEX Take 1 tablet (600 mg total) by mouth 2 (two) times daily for 5 days.   hydrochlorothiazide 12.5 MG capsule Commonly known as:  MICROZIDE Take 12.5 mg by mouth daily.   LETAIRIS 5 MG tablet Generic drug:  ambrisentan Take 5 mg by mouth daily.   levofloxacin 750 MG tablet Commonly known as:  LEVAQUIN Take 1 tablet (750 mg total) by mouth daily for 5  days. What changed:    medication strength  how much to take   losartan 50 MG tablet Commonly known as:  COZAAR Take 50 mg by mouth daily.   sodium chloride 0.65 % Soln nasal spray Commonly known as:  OCEAN Place 1 spray into both nostrils as needed for congestion.   sotalol 120 MG tablet Commonly known as:  BETAPACE Take 120 mg by mouth 2 (two) times daily.   torsemide 10 MG tablet Commonly known as:  DEMADEX Take 10 mg by mouth as needed.          Total Time in preparing paper work, data evaluation and todays exam - 35 minutes  Auburn Bilberry M.D on 10/04/2018 at 1:10 PM [employer]   Office  [phone]

## 2018-10-04 NOTE — Care Management (Signed)
Discharge to home today per Dr. Patel. No follow-up needs identified. Family will transport Haley Adams S German Manke RN MSN CCM Care Management [phone] 

## 2018-10-04 NOTE — Progress Notes (Signed)
Pt D/C to home with husband. IV removed intact. VSS. Pt belongings sent with pt. Education completed. All questions answered.

## 2018-10-06 LAB — CULTURE, RESPIRATORY: SPECIAL REQUESTS: NORMAL

## 2018-10-06 LAB — CULTURE, RESPIRATORY W GRAM STAIN: Culture: NORMAL

## 2018-10-06 NOTE — ED Provider Notes (Signed)
[facility] Emergency Department Provider Note ____________   First MD Initiated Contact with Patient 09/30/18 [phone]     (approximate)  I have reviewed the triage vital signs and the nursing notes.   HISTORY  Chief Complaint Cough and Shortness of Breath   HPI Haley Adams is a 72 y.o. female with below list of chronic medical conditions including CHF, ventricular septal defect hypertension atrial fibrillation and pulmonary hypertension presents to the emergency department with progressive dyspnea and generalized malaise x1 day.  Patient does admit to productive cough and a low-grade fever.  Patient's oxygen saturation 84% on my arrival to the room.  Patient denies any lower extreme any pain or swelling.  Patient denies any chest pain   Past Medical History:  Diagnosis Date  . Anxiety   . Atrial fibrillation (HCC)   . CHF (congestive heart failure) (HCC)   . GERD (gastroesophageal reflux disease)   . HLD (hyperlipidemia)   . Hypertension   . Pulmonary hypertension (HCC)   . VSD (ventricular septal defect)     Patient Active Problem List   Diagnosis Date Noted  . Sepsis (HCC) 11/12/2016  . CAP (community acquired pneumonia) 11/12/2016  . HTN (hypertension) 11/12/2016  . HLD (hyperlipidemia) 11/12/2016  . GERD (gastroesophageal reflux disease) 11/12/2016  . Anxiety 11/12/2016  . Chronic combined systolic and diastolic CHF (congestive heart failure) (HCC) 11/12/2016  . Atrial fibrillation (HCC) 11/12/2016  . Elevated troponin 11/12/2016    Past Surgical History:  Procedure Laterality Date  . CARDIAC CATHETERIZATION    . EYE SURGERY    . TOTAL ABDOMINAL HYSTERECTOMY W/ BILATERAL SALPINGOOPHORECTOMY      Prior to Admission medications   Medication Sig Start Date End Date Taking? Authorizing Provider  ambrisentan (LETAIRIS) 5 MG tablet Take 5 mg by mouth daily.   Yes [provider]  hydrochlorothiazide (MICROZIDE) 12.5 MG capsule  Take 12.5 mg by mouth daily.   Yes [provider]  losartan (COZAAR) 50 MG tablet Take 50 mg by mouth daily.   Yes [provider]  sotalol (BETAPACE) 120 MG tablet Take 120 mg by mouth 2 (two) times daily.   Yes [provider]  aspirin EC 81 MG tablet Take 81 mg by mouth daily.    [provider]  diazepam (VALIUM) 2 MG tablet Take 2 mg by mouth every 6 (six) hours as needed for anxiety.    [provider]  guaiFENesin (MUCINEX) 600 MG 12 hr tablet Take 1 tablet (600 mg total) by mouth 2 (two) times daily for 5 days. 10/04/18 10/09/18  Auburn Bilberry, MD  levofloxacin (LEVAQUIN) 750 MG tablet Take 1 tablet (750 mg total) by mouth daily for 5 days. 10/04/18 10/09/18  Auburn Bilberry, MD  sodium chloride (OCEAN) 0.65 % SOLN nasal spray Place 1 spray into both nostrils as needed for congestion. 10/04/18 11/03/18  Auburn Bilberry, MD  torsemide (DEMADEX) 10 MG tablet Take 10 mg by mouth as needed.    [provider]    Allergies Penicillins; Sulfa antibiotics; and Tylenol [acetaminophen]  Family History  Problem Relation Age of Onset  . Parkinsonism Mother   . COPD Father   . Cancer Brother   . Aneurysm Brother     Social History Social History   Tobacco Use  . Smoking status: Never Smoker  . Smokeless tobacco: Never Used  Substance Use Topics  . Alcohol use: No  . Drug use: No    Review of Systems Constitutional:  Positive for fever and chills Eyes: No visual changes. ENT: No sore throat. Cardiovascular: Denies chest pain. Respiratory: Positive for dyspnea productive cough Gastrointestinal: No abdominal pain.  No nausea, no vomiting.  No diarrhea.  No constipation. Genitourinary: Negative for dysuria. Musculoskeletal: Negative for neck pain.  Negative for back pain. Integumentary: Negative for rash. Neurological: Negative for headaches, focal weakness or  numbness.   ____________________________________________   PHYSICAL EXAM:  VITAL SIGNS: ED Triage Vitals  Enc Vitals Group     BP 09/30/18 0133 (!) 171/75     Pulse Rate 09/30/18 0133 88     Resp 09/30/18 0133 20     Temp 09/30/18 0133 100 F (37.8 C)     Temp Source 09/30/18 0133 Oral     SpO2 09/30/18 0133 (!) 86 %     Weight 09/30/18 0128 83.5 kg (184 lb)     Height 09/30/18 0128 1.676 m (5\' 6" )     Head Circumference --      Peak Flow --      Pain Score 09/30/18 0128 0     Pain Loc --      Pain Edu? --      Excl. in GC? --     Constitutional: Alert and oriented.  Apparent respiratory difficulty eyes: Conjunctivae are normal.  Head: Atraumatic. Mouth/Throat: Mucous membranes are moist.  Oropharynx non-erythematous. Neck: No stridor.   Cardiovascular: Normal rate, regular rhythm. Good peripheral circulation. Grossly normal heart sounds. Respiratory: Normal respiratory effort.  No retractions.  Diffuse rhonchi worse on left. Gastrointestinal: Soft and nontender. No distention.  Musculoskeletal: No lower extremity tenderness nor edema. No gross deformities of extremities. Neurologic:  Normal speech and language. No gross focal neurologic deficits are appreciated.  Skin:  Skin is warm, dry and intact. No rash noted. Psychiatric: Mood and affect are normal. Speech and behavior are normal.  ____________________________________________   LABS (all labs ordered are listed, but only abnormal results are displayed)  Labs Reviewed  CBC - Abnormal; Notable for the following components:      Result Value   WBC 13.0 (*)    RBC 5.20 (*)    HCT 46.7 (*)    RDW 16.5 (*)    All other components within normal limits  COMPREHENSIVE METABOLIC PANEL - Abnormal; Notable for the following components:   Glucose, Bld 174 (*)    AST 13 (*)    All other components within normal limits  HEMOGLOBIN A1C - Abnormal; Notable for the following components:   Hgb A1c MFr Bld 6.2 (*)    All  other components within normal limits  PROTIME-INR - Abnormal; Notable for the following components:   Prothrombin Time 23.7 (*)    All other components within normal limits  PROTIME-INR - Abnormal; Notable for the following components:   Prothrombin Time 24.3 (*)    All other components within normal limits  PROTIME-INR - Abnormal; Notable for the following components:   Prothrombin Time 24.2 (*)    All other components within normal limits  PROTIME-INR - Abnormal; Notable for the following components:   Prothrombin Time 24.2 (*)    All other components within normal limits  CBC - Abnormal; Notable for the following components:   WBC 20.6 (*)    RDW 16.2 (*)    All other components within normal limits  PROTIME-INR - Abnormal; Notable for the following components:   Prothrombin Time 19.8 (*)    All other components within normal limits  CBC -  Abnormal; Notable for the following components:   WBC 12.5 (*)    RDW 16.5 (*)    All other components within normal limits  BASIC METABOLIC PANEL - Abnormal; Notable for the following components:   Calcium 8.4 (*)    All other components within normal limits  EXPECTORATED SPUTUM ASSESSMENT W REFEX TO RESP CULTURE  CULTURE, RESPIRATORY  TROPONIN I  TSH  HEMOGLOBIN AND HEMATOCRIT, BLOOD  HEMOGLOBIN AND HEMATOCRIT, BLOOD  PROCALCITONIN   ____________________________________________  EKG  ED ECG REPORT I, [place] N BROWN, the attending physician, personally viewed and interpreted this ECG.   Date: 09/30/2018  EKG Time: 1:29 AM  Rate: 84  Rhythm: Normal sinus rhythm with left ventricular hypertrophy  Axis: Leftward Axis  Intervals: Normal  ST&T Change: Anterior T wave inversion  ____________________________________________  RADIOLOGY I, Ponderay Dewayne Shorter, personally viewed and evaluated these images (plain radiographs) as part of my medical decision making, as well as reviewing the written report by the radiologist.  ED MD  interpretation: Possible left lingular pneumonia  Official radiology report(s): No results found.   Procedures   ____________________________________________   INITIAL IMPRESSION / ASSESSMENT AND PLAN / ED COURSE  As part of my medical decision making, I reviewed the following data within the electronic MEDICAL RECORD NUMBER   72 year old female presented with above-stated history and physical exam with concern for possible pneumonia.  Chest x-ray consistent with left lingular pneumonia and as such patient was given appropriate IV antibiotic therapy.  Patient given a DuoNeb in the emergency department with improvement of respiratory status.  Patient discussed with Dr. Sheryle Hail for hospital admission further evaluation and management.  She currently 92% on 2 L nasal cannula ____________________________________________  FINAL CLINICAL IMPRESSION(S) / ED DIAGNOSES  Community-acquired pneumonia  MEDICATIONS GIVEN DURING THIS VISIT:  Medications  ipratropium-albuterol (DUONEB) 0.5-2.5 (3) MG/3ML nebulizer solution 3 mL (3 mLs Nebulization Given 09/30/18 0217)  levofloxacin (LEVAQUIN) IVPB 500 mg (0 mg Intravenous Stopped 09/30/18 0331)  ondansetron (ZOFRAN) injection 4 mg ( Intravenous Not Given 09/30/18 0648)  ipratropium-albuterol (DUONEB) 0.5-2.5 (3) MG/3ML nebulizer solution 3 mL ( Nebulization Not Given 09/30/18 0647)  warfarin (COUMADIN) tablet 6 mg (6 mg Oral Given 09/30/18 1758)  warfarin (COUMADIN) tablet 6 mg (6 mg Oral Given 10/01/18 1823)     ED Discharge Orders         Ordered    guaiFENesin (MUCINEX) 600 MG 12 hr tablet  2 times daily     10/04/18 1013    sodium chloride (OCEAN) 0.65 % SOLN nasal spray  As needed     10/04/18 1013    levofloxacin (LEVAQUIN) 750 MG tablet  Daily     10/04/18 1013    Increase activity slowly     10/04/18 1013    Diet - low sodium heart healthy     10/04/18 1013           Note:  This document was prepared using Dragon voice  recognition software and may include unintentional dictation errors.    Darci Current, MD 10/06/18 [phone]

## 2019-09-09 ENCOUNTER — Other Ambulatory Visit: Payer: Self-pay

## 2019-09-09 DIAGNOSIS — Z20822 Contact with and (suspected) exposure to covid-19: Secondary | ICD-10-CM

## 2019-09-10 LAB — NOVEL CORONAVIRUS, NAA: SARS-CoV-2, NAA: NOT DETECTED

## 2019-12-18 ENCOUNTER — Telehealth: Payer: Self-pay | Admitting: *Deleted

## 2019-12-18 NOTE — Telephone Encounter (Signed)
Assisted pt with covid vaccine wait list placement. 

## 2020-10-06 IMAGING — CR DG CHEST 2V
2 series · 2 of 2 positions shown · non-contrast
Comparison: Chest radiograph November 11, 2016

CLINICAL DATA: Cough, shortness of breath since yesterday.

EXAM:
CHEST - 2 VIEW

[chest pa]
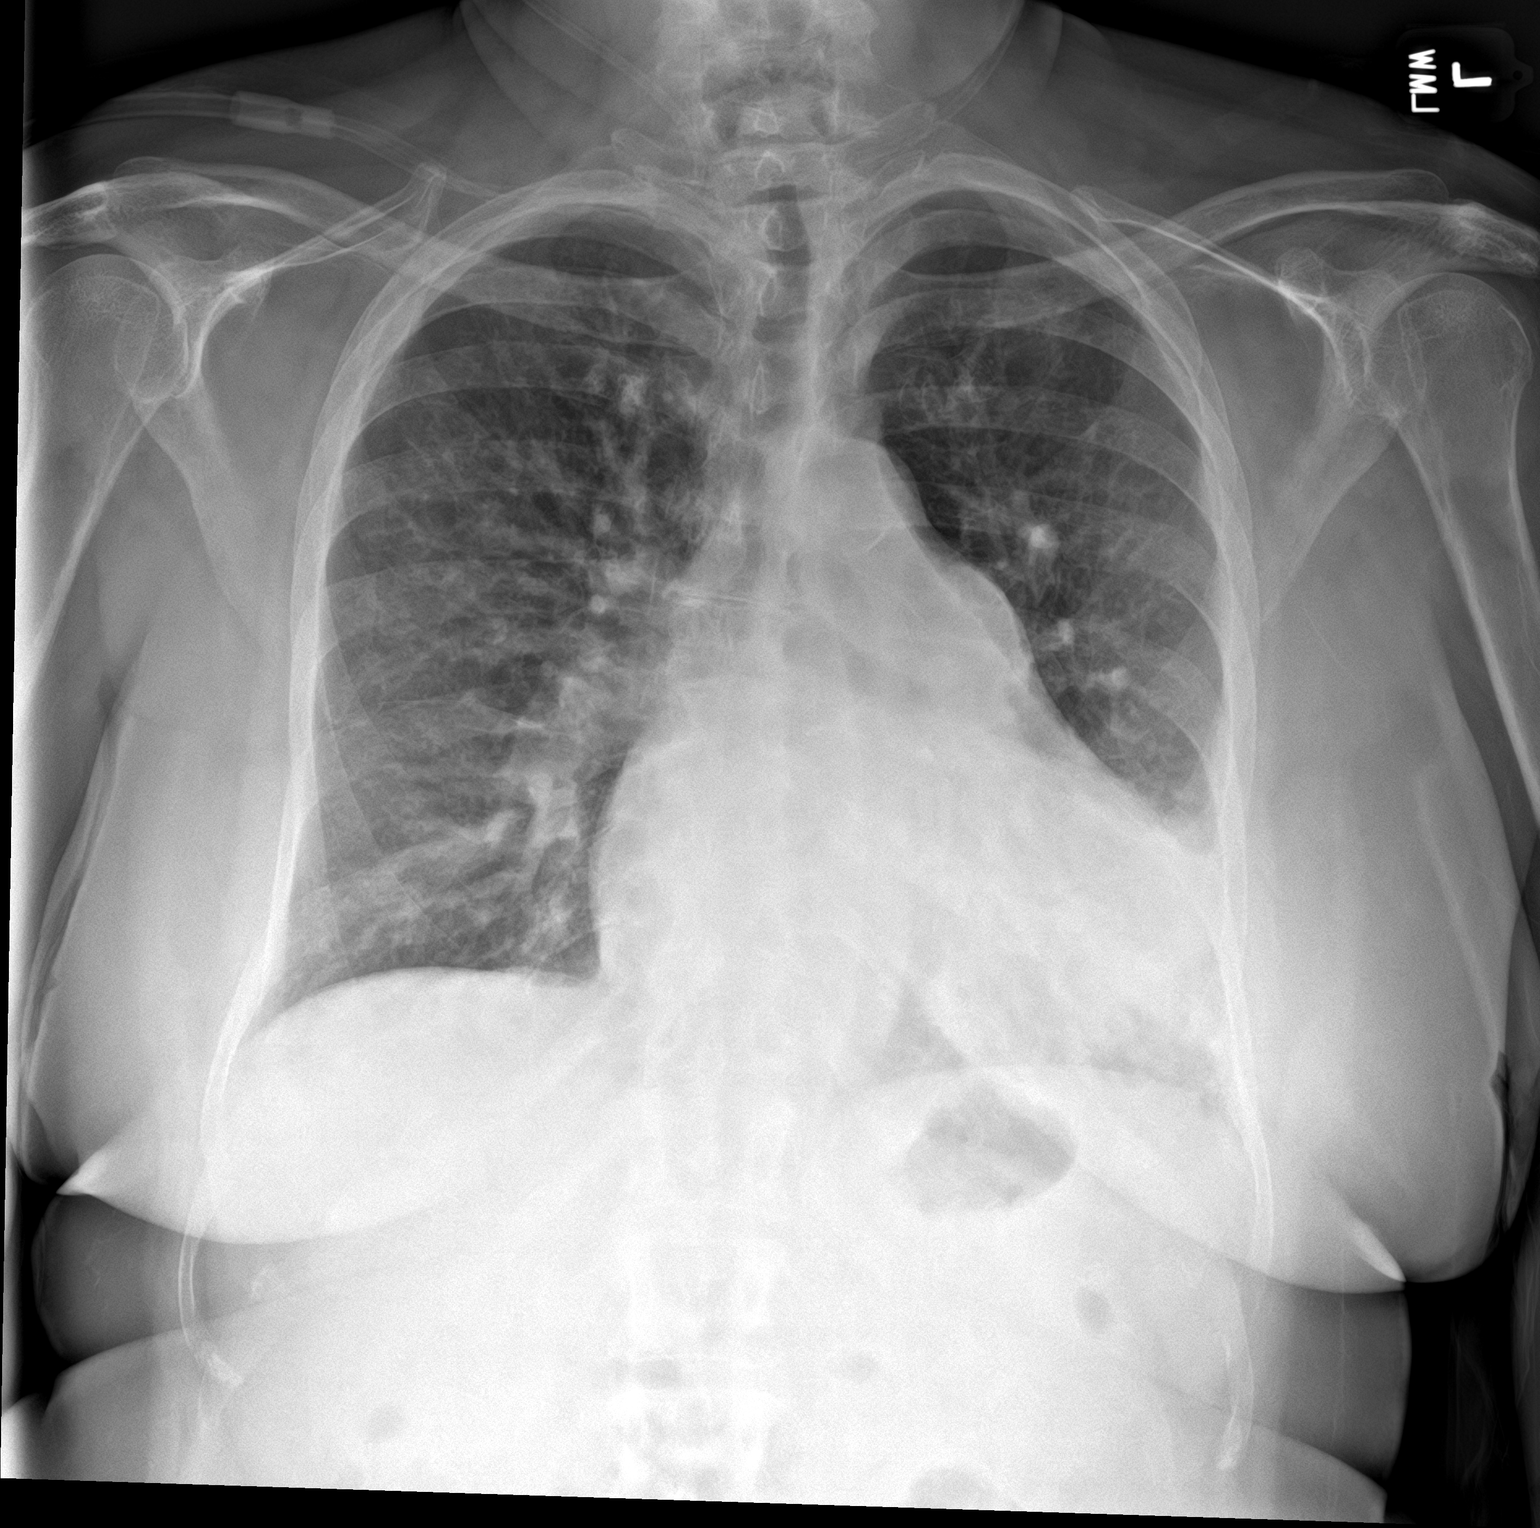

[chest lat]
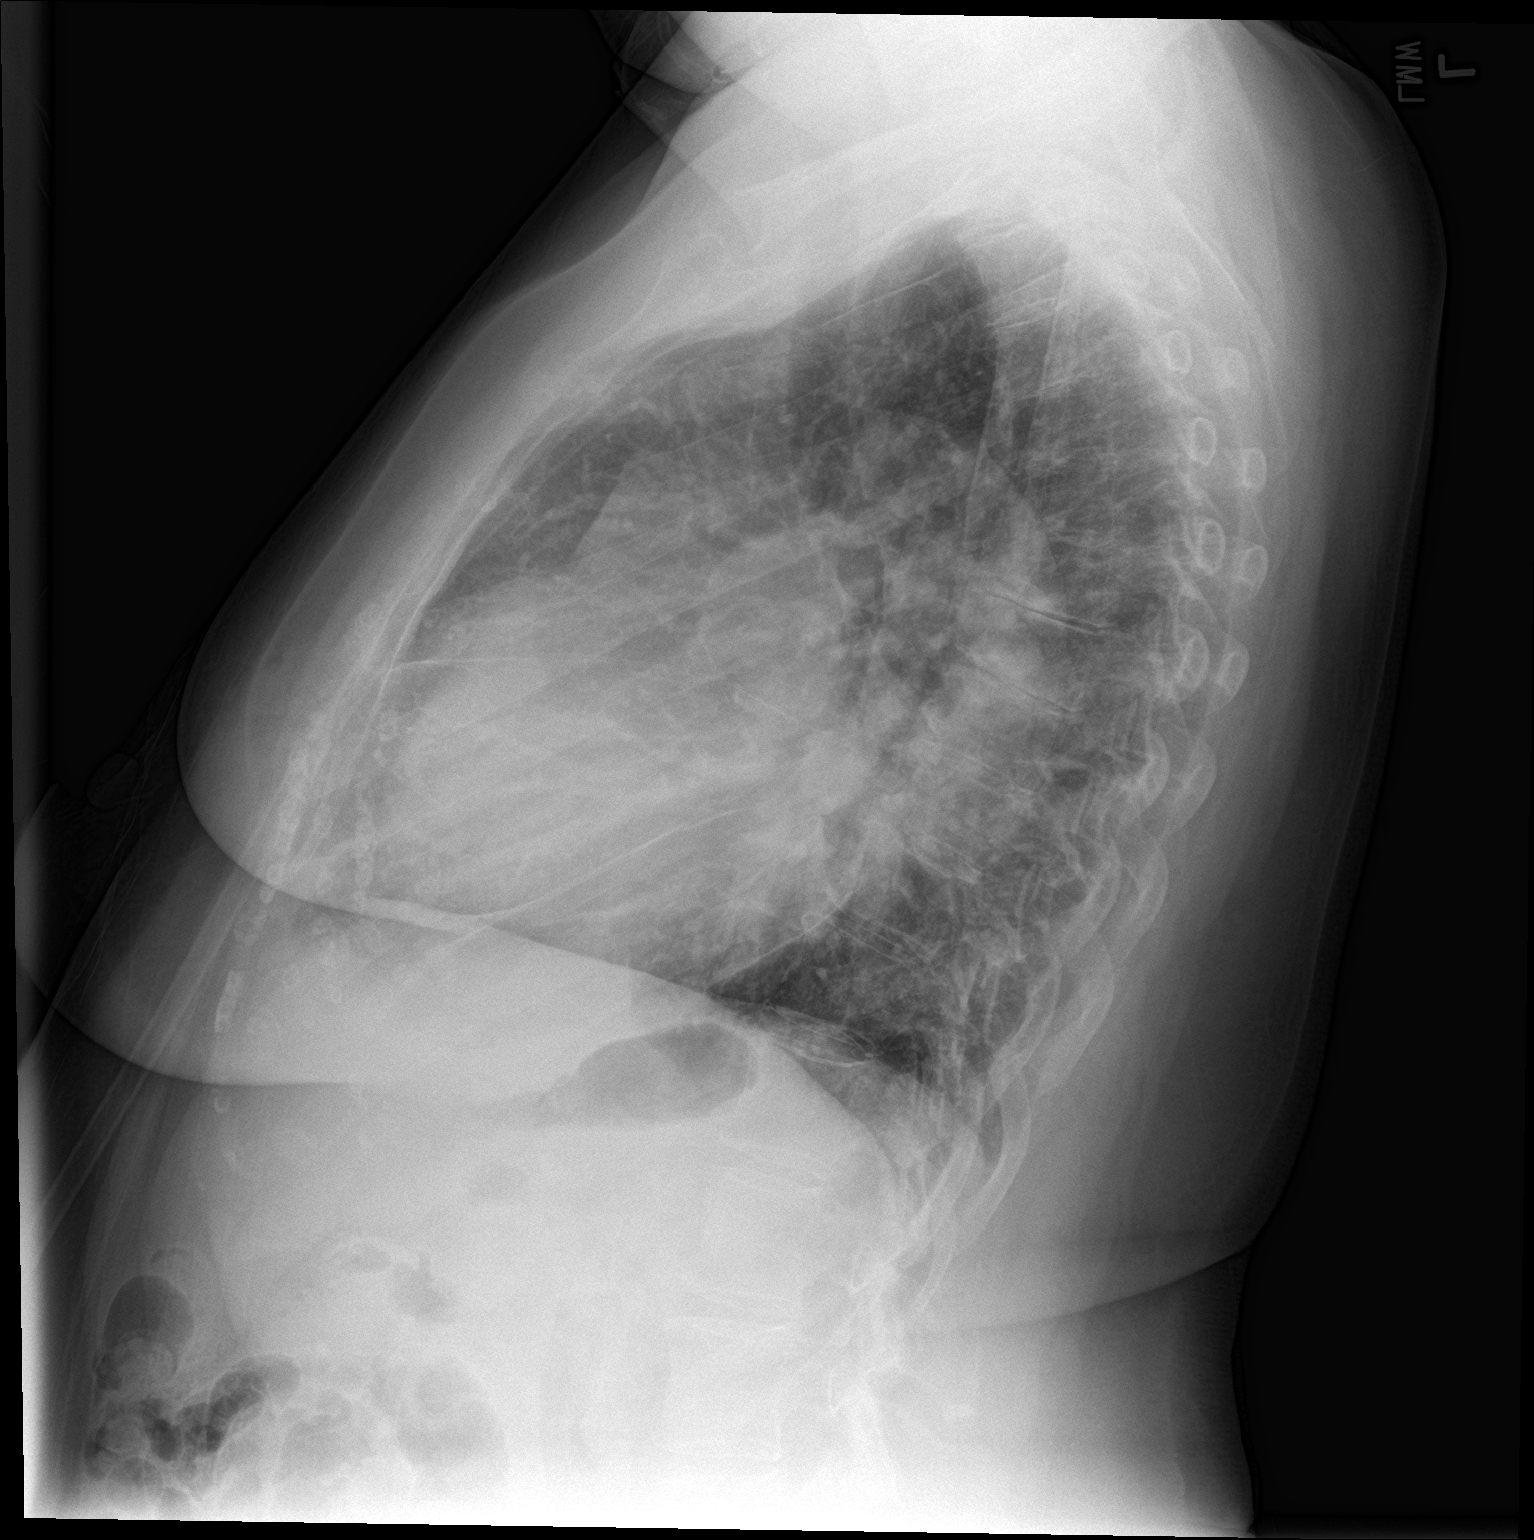

[2 of 2 positions shown; findings below may reference images not displayed]

FINDINGS: Cardiac silhouette is mildly enlarged and unchanged. Calcified
aortic arch. Similar pulmonary vascular congestion. Patchy lingular
consolidation without pleural effusion. No pneumothorax. Soft tissue
planes and included osseous structures are non suspicious.
IMPRESSION: 1. Lingular atelectasis versus pneumonia.
2. Stable cardiomegaly and pulmonary vascular congestion.
3.  Aortic Atherosclerosis ([id number]).

## 2022-01-21 ENCOUNTER — Other Ambulatory Visit: Payer: Self-pay

## 2022-01-21 ENCOUNTER — Emergency Department: Payer: Medicare Other

## 2022-01-21 ENCOUNTER — Encounter: Payer: Self-pay | Admitting: Emergency Medicine

## 2022-01-21 ENCOUNTER — Observation Stay
Admission: EM | Admit: 2022-01-21 | Discharge: 2022-01-24 | Disposition: A | Discharge status: Home / Self Care | Payer: Medicare Other | Attending: Internal Medicine | Admitting: Internal Medicine

## 2022-01-21 DIAGNOSIS — I10 Essential (primary) hypertension: Secondary | ICD-10-CM | POA: Diagnosis present

## 2022-01-21 DIAGNOSIS — I272 Pulmonary hypertension, unspecified: Secondary | ICD-10-CM

## 2022-01-21 DIAGNOSIS — J45909 Unspecified asthma, uncomplicated: Secondary | ICD-10-CM | POA: Diagnosis not present

## 2022-01-21 DIAGNOSIS — R778 Other specified abnormalities of plasma proteins: Secondary | ICD-10-CM | POA: Insufficient documentation

## 2022-01-21 DIAGNOSIS — Z20822 Contact with and (suspected) exposure to covid-19: Secondary | ICD-10-CM | POA: Insufficient documentation

## 2022-01-21 DIAGNOSIS — Q210 Ventricular septal defect: Secondary | ICD-10-CM

## 2022-01-21 DIAGNOSIS — I3139 Other pericardial effusion (noninflammatory): Secondary | ICD-10-CM | POA: Diagnosis not present

## 2022-01-21 DIAGNOSIS — J9601 Acute respiratory failure with hypoxia: Secondary | ICD-10-CM | POA: Insufficient documentation

## 2022-01-21 DIAGNOSIS — I4891 Unspecified atrial fibrillation: Secondary | ICD-10-CM | POA: Insufficient documentation

## 2022-01-21 DIAGNOSIS — I5042 Chronic combined systolic (congestive) and diastolic (congestive) heart failure: Secondary | ICD-10-CM | POA: Insufficient documentation

## 2022-01-21 DIAGNOSIS — J189 Pneumonia, unspecified organism: Secondary | ICD-10-CM | POA: Diagnosis not present

## 2022-01-21 DIAGNOSIS — Z7982 Long term (current) use of aspirin: Secondary | ICD-10-CM | POA: Diagnosis not present

## 2022-01-21 DIAGNOSIS — Z79899 Other long term (current) drug therapy: Secondary | ICD-10-CM | POA: Diagnosis not present

## 2022-01-21 DIAGNOSIS — R059 Cough, unspecified: Secondary | ICD-10-CM | POA: Diagnosis present

## 2022-01-21 DIAGNOSIS — I110 Hypertensive heart disease with heart failure: Secondary | ICD-10-CM | POA: Diagnosis not present

## 2022-01-21 DIAGNOSIS — R1310 Dysphagia, unspecified: Secondary | ICD-10-CM

## 2022-01-21 DIAGNOSIS — R197 Diarrhea, unspecified: Secondary | ICD-10-CM

## 2022-01-21 LAB — BRAIN NATRIURETIC PEPTIDE: B Natriuretic Peptide: 314 pg/mL — ABNORMAL HIGH (ref 0.0–100.0)

## 2022-01-21 LAB — CBC WITH DIFFERENTIAL/PLATELET
Abs Immature Granulocytes: 0.03 10*3/uL (ref 0.00–0.07)
Basophils Absolute: 0 10*3/uL (ref 0.0–0.1)
Basophils Relative: 0 %
Eosinophils Absolute: 0.2 10*3/uL (ref 0.0–0.5)
Eosinophils Relative: 2 %
HCT: 45.2 % (ref 36.0–46.0)
Hemoglobin: 14.2 g/dL (ref 12.0–15.0)
Immature Granulocytes: 0 %
Lymphocytes Relative: 20 %
Lymphs Abs: 1.6 10*3/uL (ref 0.7–4.0)
MCH: 28.4 pg (ref 26.0–34.0)
MCHC: 31.4 g/dL (ref 30.0–36.0)
MCV: 90.4 fL (ref 80.0–100.0)
Monocytes Absolute: 0.9 10*3/uL (ref 0.1–1.0)
Monocytes Relative: 12 %
Neutro Abs: 5.2 10*3/uL (ref 1.7–7.7)
Neutrophils Relative %: 66 %
Platelets: 189 10*3/uL (ref 150–400)
RBC: 5 MIL/uL (ref 3.87–5.11)
RDW: 15.2 % (ref 11.5–15.5)
WBC: 7.9 10*3/uL (ref 4.0–10.5)
nRBC: 0 % (ref 0.0–0.2)

## 2022-01-21 LAB — URINALYSIS, COMPLETE (UACMP) WITH MICROSCOPIC
Bacteria, UA: NONE SEEN
Bilirubin Urine: NEGATIVE
Glucose, UA: NEGATIVE mg/dL
Hgb urine dipstick: NEGATIVE
Ketones, ur: NEGATIVE mg/dL
Nitrite: NEGATIVE
Protein, ur: 30 mg/dL — AB
Specific Gravity, Urine: 1.006 (ref 1.005–1.030)
pH: 6 (ref 5.0–8.0)

## 2022-01-21 LAB — COMPREHENSIVE METABOLIC PANEL
ALT: 18 U/L (ref 0–44)
AST: 22 U/L (ref 15–41)
Albumin: 4 g/dL (ref 3.5–5.0)
Alkaline Phosphatase: 54 U/L (ref 38–126)
Anion gap: 10 (ref 5–15)
BUN: 15 mg/dL (ref 8–23)
CO2: 22 mmol/L (ref 22–32)
Calcium: 8.9 mg/dL (ref 8.9–10.3)
Chloride: 106 mmol/L (ref 98–111)
Creatinine, Ser: 0.58 mg/dL (ref 0.44–1.00)
GFR, Estimated: >60 mL/min (ref >60)
Glucose, Bld: 118 mg/dL — ABNORMAL HIGH (ref 70–99)
Potassium: 3.9 mmol/L (ref 3.5–5.1)
Sodium: 138 mmol/L (ref 135–145)
Total Bilirubin: 0.6 mg/dL (ref 0.3–1.2)
Total Protein: 7.2 g/dL (ref 6.5–8.1)

## 2022-01-21 LAB — TROPONIN I (HIGH SENSITIVITY)
Troponin I (High Sensitivity): 101 ng/L (ref <18)
Troponin I (High Sensitivity): 221 ng/L (ref <18)
Troponin I (High Sensitivity): 570 ng/L (ref <18)
Troponin I (High Sensitivity): 495 ng/L (ref <18)

## 2022-01-21 LAB — MAGNESIUM: Magnesium: 1.9 mg/dL (ref 1.7–2.4)

## 2022-01-21 LAB — PROTIME-INR
INR: 1.9 — ABNORMAL HIGH (ref 0.8–1.2)
Prothrombin Time: 22.2 seconds — ABNORMAL HIGH (ref 11.4–15.2)

## 2022-01-21 LAB — APTT: aPTT: 42 seconds — ABNORMAL HIGH (ref 24–36)

## 2022-01-21 LAB — RESP PANEL BY RT-PCR (FLU A&B, COVID) ARPGX2
Influenza A by PCR: NEGATIVE
Influenza B by PCR: NEGATIVE
SARS Coronavirus 2 by RT PCR: NEGATIVE

## 2022-01-21 MED ORDER — LOSARTAN POTASSIUM 50 MG PO TABS
50.0000 mg | ORAL_TABLET | Freq: Two times a day (BID) | ORAL | Status: DC
Start: 2022-01-21 — End: 2022-01-24
  Administered 2022-01-21 – 2022-01-24 (×6): 50 mg via ORAL
  Filled 2022-01-21 (×6): qty 1

## 2022-01-21 MED ORDER — ONDANSETRON HCL 4 MG/2ML IJ SOLN: 4.0000 mg | Freq: Four times a day (QID) | INTRAMUSCULAR | Status: DC | PRN

## 2022-01-21 MED ORDER — TRAZODONE HCL 50 MG PO TABS: 25.0000 mg | ORAL_TABLET | Freq: Every evening | ORAL | Status: DC | PRN

## 2022-01-21 MED ORDER — SODIUM CHLORIDE 0.9 % IV SOLN
2.0000 g | Freq: Every day | INTRAVENOUS | Status: DC
  Administered 2022-01-22 – 2022-01-24 (×3): 2 g via INTRAVENOUS
  Filled 2022-01-21: qty 20
  Filled 2022-01-21: qty 2
  Filled 2022-01-21 (×2): qty 20

## 2022-01-21 MED ORDER — ACETAMINOPHEN 650 MG RE SUPP: 650.0000 mg | Freq: Four times a day (QID) | RECTAL | Status: DC | PRN

## 2022-01-21 MED ORDER — WARFARIN SODIUM 6 MG PO TABS
7.0000 mg | ORAL_TABLET | Freq: Once | ORAL | Status: AC
  Administered 2022-01-21: 7 mg via ORAL
  Filled 2022-01-21: qty 1

## 2022-01-21 MED ORDER — SODIUM CHLORIDE 0.9 % IV SOLN
500.0000 mg | Freq: Once | INTRAVENOUS | Status: DC
  Filled 2022-01-21: qty 5

## 2022-01-21 MED ORDER — WARFARIN - PHARMACIST DOSING INPATIENT
Freq: Every day | Status: DC
  Filled 2022-01-21: qty 1

## 2022-01-21 MED ORDER — SODIUM CHLORIDE 0.9 % IV SOLN
500.0000 mg | Freq: Every day | INTRAVENOUS | Status: DC
  Administered 2022-01-21 – 2022-01-22 (×2): 500 mg via INTRAVENOUS
  Filled 2022-01-21: qty 500

## 2022-01-21 MED ORDER — SODIUM CHLORIDE 0.9 % IV SOLN
1.0000 g | Freq: Once | INTRAVENOUS | Status: AC
  Administered 2022-01-21: 1 g via INTRAVENOUS
  Filled 2022-01-21: qty 10

## 2022-01-21 MED ORDER — ACETAMINOPHEN 325 MG PO TABS: 650.0000 mg | ORAL_TABLET | Freq: Four times a day (QID) | ORAL | Status: DC | PRN

## 2022-01-21 MED ORDER — SOTALOL HCL 80 MG PO TABS
120.0000 mg | ORAL_TABLET | Freq: Two times a day (BID) | ORAL | Status: DC
  Administered 2022-01-21 – 2022-01-24 (×7): 120 mg via ORAL
  Filled 2022-01-21 (×8): qty 1.5

## 2022-01-21 MED ORDER — AMBRISENTAN 5 MG PO TABS
5.0000 mg | ORAL_TABLET | Freq: Every day | ORAL | Status: DC
  Administered 2022-01-21 – 2022-01-23 (×3): 5 mg via ORAL
  Filled 2022-01-21 (×6): qty 1

## 2022-01-21 MED ORDER — ENOXAPARIN SODIUM 40 MG/0.4ML IJ SOSY
40.0000 mg | PREFILLED_SYRINGE | INTRAMUSCULAR | Status: DC
  Administered 2022-01-21: 40 mg via SUBCUTANEOUS
  Filled 2022-01-21: qty 0.4

## 2022-01-21 MED ORDER — DIAZEPAM 2 MG PO TABS: 2.0000 mg | ORAL_TABLET | Freq: Four times a day (QID) | ORAL | Status: DC | PRN

## 2022-01-21 MED ORDER — ASPIRIN 81 MG PO CHEW
324.0000 mg | CHEWABLE_TABLET | Freq: Once | ORAL | Status: AC
  Administered 2022-01-21: 324 mg via ORAL
  Filled 2022-01-21: qty 4

## 2022-01-21 MED ORDER — ONDANSETRON HCL 4 MG PO TABS: 4.0000 mg | ORAL_TABLET | Freq: Four times a day (QID) | ORAL | Status: DC | PRN

## 2022-01-21 MED ORDER — SERTRALINE HCL 50 MG PO TABS
25.0000 mg | ORAL_TABLET | Freq: Every day | ORAL | Status: DC
  Administered 2022-01-21 – 2022-01-23 (×3): 25 mg via ORAL
  Filled 2022-01-21 (×3): qty 1

## 2022-01-21 MED ORDER — AMLODIPINE BESYLATE 5 MG PO TABS
2.5000 mg | ORAL_TABLET | Freq: Every day | ORAL | Status: DC
  Administered 2022-01-22 – 2022-01-24 (×3): 2.5 mg via ORAL
  Filled 2022-01-21 (×3): qty 1

## 2022-01-21 NOTE — H&P (Signed)
?History and Physical  ? ? ?Patient: Haley ChesterCynthia Minner WJX:[id number] DOB: Jan 05, 1946 ?DOA: 01/21/2022 ?DOS: the patient was seen and examined on 01/21/2022 ?[place]: [place], [facility]  ?Patient coming from: Home ? ?Chief Complaint:  ?Chief Complaint  ?Patient presents with  ? Cough  ? Shortness of Breath  ? ? ?HPI: Haley Adams is a 76 y.o. 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  Cough is productive of blood-tinged yellow phlegm.  She denies chest pain.  Denies fever or chills. ?ED course: BP 171/100, pulse 96, temp 98.9, O2 sat 89% on room air ?Labs: CBC and CMP unremarkable, troponin 101, BNP 314.  UA WNL ?EKG, personally viewed and interpreted: NSR at 95 with T wave inversion anterior leads, unchanged from prior ?CT chest without contrast: Small area of patchy and nodular densities within posterior left upper lobe, nonspecific but suspected to reflect a small area of mild infection/pneumonitis.  Otherwise nonacute ?Patient was started on Rocephin and azithromycin.  She was placed on continuous oxygen due to increased work of breathing at rest and hypoxia in the 80s at rest.  ? ?Review of Systems: As mentioned in the history of present illness. All other systems reviewed and are negative. ?Past Medical History:  ?Diagnosis Date  ? Anxiety   ? Atrial fibrillation (HCC)   ? CHF (congestive heart failure) (HCC)   ? GERD (gastroesophageal reflux disease)   ? HLD (hyperlipidemia)   ? Hypertension   ? Pulmonary hypertension (HCC)   ? VSD (ventricular septal defect)   ? ?Past Surgical History:  ?Procedure Laterality Date  ? CARDIAC CATHETERIZATION    ? EYE SURGERY    ? TOTAL ABDOMINAL HYSTERECTOMY W/ BILATERAL  SALPINGOOPHORECTOMY    ? ?Social History:  reports that she has never smoked. She has never used smokeless tobacco. She reports that she does not drink alcohol and does not use drugs. ? ?Allergies  ?Allergen Reactions  ? Penicillins Other (See Comments)  ?  Has patient had a PCN reaction causing immediate rash, facial/tongue/throat swelling, SOB or lightheadedness with hypotension: no ?Has patient had a PCN reaction causing severe rash involving mucus membranes or skin necrosis: no ?Has patient had a PCN reaction that required hospitalization no ?Has patient had a PCN reaction occurring within the last 10 years: no ?If all of the above answers are "NO", then may proceed with Cephalosporin use. ? ?Fever 105   ? Sulfa Antibiotics   ? Tylenol [Acetaminophen]   ? ? ?Family History  ?Problem Relation Age of Onset  ? Parkinsonism Mother   ? COPD Father   ? Cancer Brother   ? Aneurysm Brother   ? ? ?Prior to Admission medications   ?Medication Sig Start Date End Date Taking? Authorizing Provider  ?ambrisentan (LETAIRIS) 5 MG tablet Take 5 mg by mouth daily.    [provider]  ?aspirin EC 81 MG tablet Take 81 mg by mouth daily.    [provider]  ?diazepam (VALIUM) 2 MG tablet Take 2 mg by mouth every 6 (six) hours as needed for anxiety.    [provider]  ?hydrochlorothiazide (MICROZIDE) 12.5 MG capsule Take 12.5 mg by mouth daily.    [provider]  ?losartan (COZAAR)  50 MG tablet Take 50 mg by mouth daily.    [provider]  ?sodium chloride (OCEAN) 0.65 % SOLN nasal spray Place 1 spray into both nostrils as needed for congestion. 10/04/18 11/03/18  Auburn Bilberry, MD  ?sotalol (BETAPACE) 120 MG tablet Take 120 mg by mouth 2 (two) times daily.    [provider]  ?torsemide (DEMADEX) 10 MG tablet Take 10 mg by mouth as needed.    [provider]  ? ? ?Physical Exam: ?Vitals:  ? 01/21/22 0114 01/21/22 0135 01/21/22 0200 01/21/22 0300  ?BP: (!) 171/100  (!) 156/76 (!) 157/82 (!) 153/75  ?Pulse: 96 83 97 83  ?Resp: 18 19 11  (!) 25  ?Temp: 98.9 ?F (37.2 ?C)     ?TempSrc: Oral     ?SpO2: (!) 89% 92% 95% 92%  ?Weight: 68 kg     ?Height: 5\' 6"  (1.676 m)     ? ?Physical Exam ?Vitals and nursing note reviewed.  ?Constitutional:   ?   General: She is not in acute distress. ?   Appearance: Normal appearance.  ?HENT:  ?   Head: Normocephalic and atraumatic.  ?Cardiovascular:  ?   Rate and Rhythm: Normal rate and regular rhythm.  ?   Heart sounds: No murmur heard. ?Pulmonary:  ?   Effort: Tachypnea present.  ?   Breath sounds: Examination of the right-lower field reveals rales. Examination of the left-lower field reveals rales. Rales present. No wheezing or rhonchi.  ?Abdominal:  ?   General: Bowel sounds are normal.  ?   Palpations: Abdomen is soft.  ?   Tenderness: There is no abdominal tenderness.  ?Musculoskeletal:     ?   General: No swelling or tenderness. Normal range of motion.  ?   Cervical back: Normal range of motion and neck supple.  ?Skin: ?   General: Skin is warm and dry.  ?Neurological:  ?   General: No focal deficit present.  ?   Mental Status: She is alert. Mental status is at baseline.  ?Psychiatric:     ?   Mood and Affect: Mood normal.     ?   Behavior: Behavior normal.  ? ? ? ?Data Reviewed: ?Relevant notes from primary care and specialist visits, past discharge summaries as available in EHR, including Care Everywhere. ?Prior diagnostic testing as pertinent to current admission diagnoses ?Updated medications and problem lists for reconciliation ?ED course, including vitals, labs, imaging, treatment and response to treatment ?Triage notes, nursing and pharmacy notes and ED provider's notes ?Notable results as noted in HPI ? ? ?Assessment and Plan: ?* CAP (community acquired pneumonia) ?Rocephin and azithromycin ?Antitussives, DuoNebs as needed, decongestants as needed ?Supplemental oxygen to keep sats over 92% ? ?Acute respiratory failure with hypoxia  (HCC) ?Patient on nighttime O2 only at home, now requiring continuous O2. Tachypneic, conversational dyspnea ?Continuous O2 to keep sats over 94 ? ?Elevated troponin ?Suspect demand ischemia from acute illness ?Continue to monitor ?Cardiology consult in view of underlying cardiac comorbidity ? ?Atrial fibrillation (HCC) ?On Coumadin.  INR pending.  Pharmacy to manage ? ?VSD (ventricular septal defect) ?Unrepaired large muscular VSD followed by [facility] ? ?Pulmonary hypertension (HCC) ?Continue Ambrisentan ? ?Chronic combined systolic and diastolic CHF (congestive heart failure) (HCC) ?Compensated.  Continue losartan, sotalol, torsemide ? ?HTN (hypertension) ?Continue losartan, HCTZ ? ? ? ? ? ? ?Advance Care Planning:   Code Status: Prior  ? ?Consults: none ? ?Family Communication: none ? ?Severity of Illness: ?The appropriate patient  status for this patient is INPATIENT. Inpatient status is judged to be reasonable and necessary in order to provide the required intensity of service to ensure the patient's safety. The patient's presenting symptoms, physical exam findings, and initial radiographic and laboratory data in the context of their chronic comorbidities is felt to place them at high risk for further clinical deterioration. Furthermore, it is not anticipated that the patient will be medically stable for discharge from the hospital within 2 midnights of admission.  ? ?* I certify that at the point of admission it is my clinical judgment that the patient will require inpatient hospital care spanning beyond 2 midnights from the point of admission due to high intensity of service, high risk for further deterioration and high frequency of surveillance required.* ? ?Author: ?Andris Baumann, MD ?01/21/2022 3:59 AM ? ?For on call review [email or web address].  ?

## 2022-01-21 NOTE — Assessment & Plan Note (Addendum)
Initially started on ceftriaxone and azithromycin. ?Azithromycin discontinued on 3/9 due to interaction with sotalol causing prolonged QTc >500. ?Briefly had initiated doxycycline, got 1 dose and was discontinued due to concern for esophageal issues i.e. reflux and not wanting to make it worse. ?Has thus far completed 4 days of IV ceftriaxone, will complete an additional 3 days of cefpodoxime totaling 7 days antibiotics.   ?Recommend repeating chest x-ray in 4 weeks to ensure resolution of pneumonia findings. ?

## 2022-01-21 NOTE — Assessment & Plan Note (Signed)
Continue Ambrisentan 

## 2022-01-21 NOTE — ED Triage Notes (Signed)
Pt to triage via w/c with no distress noted; reports for several days having congestion, prod cough yellow & bloody sputum with SHOB; st hx pneumonia and uses O2 at 4l/min via [place] at night ?

## 2022-01-21 NOTE — Assessment & Plan Note (Addendum)
Prior to admission, patient chronically on nighttime oxygen 4 L/min continuously. ?Presented with tachypnea and conversational dyspnea on admission due to pneumonia. ?On day of discharge today, clinically improved and seen ambulating multiple times in the hall without dyspnea or discomfort. ?As per repeat home oxygen needs evaluation, patient saturating at 92% on room air at rest, 85% on room air while ambulating (this reportedly is her baseline) and saturating 90% on 3 L/min oxygen while ambulating. ?As discussed with Dr. Beatrix Fetters, cardiology, based on chart review, patient has had oxygen saturations in the mid 80s chronically and even on a 6-minute walk test late last year.  He recommends no oxygen in the daytime and continue prior nighttime oxygen. ?

## 2022-01-21 NOTE — Consult Note (Addendum)
[facility] CARDIOLOGY CONSULT NOTE       Patient ID: Haley Adams MRN: [id number] DOB/AGE: 1945/12/09 76 y.o.  Admit date: 01/21/2022 Referring Physician Dr. Lindajo Royal Primary Physician Leim Fabry Primary Cardiologist [facility], Erroll Luna, NP Reason for Consultation elevated troponin  HPI: The patient is a 76 year old female with a past medical history notable for unrepaired muscular VSD with associated pulmonary arterial hypertension, paroxysmal atrial fibrillation on sotalol and warfarin, hypertension, hyperlipidemia, HFpEF (LVEF 50% 01/2021) who presented to [facility] ED 01/21/2022 with 3 days of worsening shortness of breath.  Cardiology is consulted because of her elevated troponin.  The patient admits to a few day history of cough and increased heart rate/palpitations associated with coughing.  She is not wearing her watch at that time she noticed her palpitations felt as if her heart rate was at least 120.  she has increased fatigue, nasal and chest congestion and a runny nose with yellow and blood-tinged mucus.  She admits to chills and has taken her temperature without noticing a fever.  She notably denies chest pain, lower extremity swelling.  No orthopnea.  She says she feels a little bit better since being in the ED and has been given 324 mg aspirin and started on empiric azithromycin and ceftriaxone.  She is currently requiring 4 L of oxygen by nasal cannula which she says she only usually wears at nighttime.  She reports compliance with all her medications.  Vitals are notable for blood pressure initially 171/100 and SPO2 89% on room air.  Improving later this morning at 133/74, SPO2 93% on 4 L by nasal cannula.  Labs on admission are notable for a potassium of 3.9, creatinine 0.58, GFR greater than 60.  BNP mildly elevated to 314.  High-sensitivity troponin trending 101-221 with repeats pending.  WBCs 7.9, H&H 14/45.  INR 1.9.  Respiratory panel negative for  influenza and coronavirus.  Urinalysis with moderate leukocytes present.    Chest x-ray with stable cardiomegaly and pulmonary vascular congestion without pleural effusion.  CT chest with small area of patchy and nodular densities within the posterior left upper lobe that are nonspecific, suspected to reflect a small area of mild infection/pneumonitis, dilation of the main pulmonary artery up to 3.9 cm, cardiomegaly with moderate pericardial effusion.  Review of systems complete and found to be negative unless listed above     Past Medical History:  Diagnosis Date   Anxiety    Atrial fibrillation (HCC)    CHF (congestive heart failure) (HCC)    GERD (gastroesophageal reflux disease)    HLD (hyperlipidemia)    Hypertension    Pulmonary hypertension (HCC)    VSD (ventricular septal defect)     Past Surgical History:  Procedure Laterality Date   CARDIAC CATHETERIZATION     EYE SURGERY     TOTAL ABDOMINAL HYSTERECTOMY W/ BILATERAL SALPINGOOPHORECTOMY      (Not in a hospital admission)  Social History   Socioeconomic History   Marital status: Married    Spouse name: Not on file   Number of children: Not on file   Years of education: Not on file   Highest education level: Not on file  Occupational History   Not on file  Tobacco Use   Smoking status: Never   Smokeless tobacco: Never  Vaping Use   Vaping Use: Never used  Substance and Sexual Activity   Alcohol use: No   Drug use: No   Sexual activity: Not on file  Other Topics  Concern   Not on file  Social History Narrative   Not on file   Social Determinants of Health   Financial Resource Strain: Not on file  Food Insecurity: Not on file  Transportation Needs: Not on file  Physical Activity: Not on file  Stress: Not on file  Social Connections: Not on file  Intimate Partner Violence: Not on file    Family History  Problem Relation Age of Onset   Parkinsonism Mother    COPD Father    Cancer Brother     Aneurysm Brother       Review of systems complete and found to be negative unless listed above    PHYSICAL EXAM General: Very pleasant elderly black female, well nourished, in no acute distress.  Sitting at incline in ED stretcher HEENT:  Normocephalic and atraumatic. Neck:  No JVD.  Lungs: Normal respiratory effort on 4 L by nasal cannula.  Decreased bibasilar breath sounds without wheezes, crackles. heart: HRRR . Normal S1 and S2.  3/6 systolic murmur.  Radial & DP pulses 2+ bilaterally. Abdomen: Soft, nontender .  Msk: Normal strength and tone for age. Extremities: Warm and well perfused. No clubbing, cyanosis.  No lower extremity edema.  Neuro: Alert and oriented X 3. Psych:  Answers questions appropriately.   Labs:   Lab Results  Component Value Date   WBC 7.9 01/21/2022   HGB 14.2 01/21/2022   HCT 45.2 01/21/2022   MCV 90.4 01/21/2022   PLT 189 01/21/2022    Recent Labs  Lab 01/21/22 0135  NA 138  K 3.9  CL 106  CO2 22  BUN 15  CREATININE 0.58  CALCIUM 8.9  PROT 7.2  BILITOT 0.6  ALKPHOS 54  ALT 18  AST 22  GLUCOSE 118*   Lab Results  Component Value Date   CKTOTAL 56 02/03/2013   CKMB 1.5 02/03/2013   TROPONINI <0.03 09/30/2018    Lab Results  Component Value Date   CHOL 226 (H) 02/03/2013   CHOL 180 07/09/2012   Lab Results  Component Value Date   HDL 37 (L) 02/03/2013   HDL 48 07/09/2012   Lab Results  Component Value Date   LDLCALC 152 (H) 02/03/2013   LDLCALC 111 (H) 07/09/2012   Lab Results  Component Value Date   TRIG 183 02/03/2013   TRIG 104 07/09/2012   No results found for: CHOLHDL No results found for: LDLDIRECT    Radiology: CT Chest Wo Contrast  Result Date: 01/21/2022 CLINICAL DATA:  Initial evaluation for acute productive cough, congestion, shortness of breath. EXAM: CT CHEST WITHOUT CONTRAST TECHNIQUE: Multidetector CT imaging of the chest was performed following the standard protocol without IV contrast. RADIATION  DOSE REDUCTION: This exam was performed according to the departmental dose-optimization program which includes automated exposure control, adjustment of the mA and/or kV according to patient size and/or use of iterative reconstruction technique. COMPARISON:  Prior radiograph from earlier the same day as well as prior chest CT from 02/23/2011. FINDINGS: Cardiovascular: Intrathoracic aorta normal in caliber. Mild atherosclerotic change. Visualized great vessels grossly within normal limits. Prominent cardiomegaly noted. Moderate pericardial effusion measuring simple fluid density noted. Main pulmonary artery dilated up to 3.9 cm, which could reflect changes of underlying pulmonary hypertension. Mediastinum/Nodes: Visualized thyroid within normal limits. No visible enlarged mediastinal or hilar lymph nodes on this noncontrast examination. No axillary adenopathy. Esophagus within normal limits. Lungs/Pleura: Tracheobronchial tree intact and patent. Mild volume loss within the left lung with associated scattered  subsegmental atelectasis within the lingula and left lower lobe. Small area of patchy and nodular densities within the posterior left upper lobe (series 3, images 39, 44), nonspecific, but suspected to reflect a small area of mild infection/pneumonitis given provided history. No other focal infiltrates or consolidative opacity. No pulmonary edema or pleural effusion. No pneumothorax. No other visible pulmonary nodule or mass. Upper Abdomen: Visualized upper abdomen demonstrates no acute finding. 8 mm hyperdensity at the posterior aspect of the visualized left kidney noted, nonspecific, but likely a small proteinaceous and/or hemorrhagic cyst. Musculoskeletal: Visualized external soft tissues demonstrate no acute finding. No acute osseous finding. No discrete or worrisome osseous lesions. Moderate multilevel thoracic spondylosis noted. IMPRESSION: 1. Small area of patchy and nodular densities within the posterior  left upper lobe, nonspecific, but suspected to reflect a small area of mild infection/pneumonitis given provided history. 2. Cardiomegaly with moderate pericardial effusion. 3. Dilatation of the main pulmonary artery up to 3.9 cm, which could reflect underlying pulmonary hypertension. 4. No other acute cardiopulmonary abnormality identified. Electronically Signed   By: Rise Mu M.D.   On: 01/21/2022 02:51   DG Chest Portable 1 View  Result Date: 01/21/2022 CLINICAL DATA:  Congestion and productive cough. EXAM: PORTABLE CHEST 1 VIEW COMPARISON:  September 30, 2018 FINDINGS: Mild, stable, diffusely increased lung markings are seen without evidence of focal consolidation, pleural effusion or pneumothorax. There is stable moderate to marked severity cardiomegaly with prominence of the perihilar pulmonary vasculature. The visualized skeletal structures are unremarkable. IMPRESSION: Stable cardiomegaly with pulmonary vascular congestion. Electronically Signed   By: Aram Candela M.D.   On: 01/21/2022 01:53    ECHO 02/11/2021   MILD LV DYSFUNCTION (See above) WITH MILD LVH    ELEVATED LA PRESSURES WITH DIASTOLIC DYSFUNCTION    MILD RV SYSTOLIC DYSFUNCTION (See above)    VALVULAR REGURGITATION: MILD MR, MILD PR, MODERATE TR    NO VALVULAR STENOSIS    SMALL PERICARDIAL EFFUSION (See above)    SMALL IAFC    LV EF LOWER LIMITS OF NORMAL.    LARGE UNRESTRICTED VSD WITH BIDIRECTIONAL FLOW, PEAK VELOCITY OF 1.102m/s.    IAFC NOTED BY COLOR AND SPECTRAL DOPPLER.    3D acquisition and reconstructions were performed as part of this    examination to more accurately quantify the effects of identified    structural abnormalities as part of the exam. (post-processing on an    Independent workstation).     Compared with prior Echo study on 12/21/2018: TR AND RVSP INCREASED.    RV AND LV FUNCTION SLIGHTLY DECREASED.    PFO NOTED ON TODAY'S EXAM.   TELEMETRY reviewed by me: Sinus rhythm with rate  between 57-91, PVCs  EKG reviewed by me: Sinus rhythm rate 93, anterolateral T wave inversions that appear unchanged prior EKG from 2019.  ASSESSMENT AND PLAN:  The patient is a 76 year old female with a past medical history notable for unrepaired muscular VSD with associated pulmonary arterial hypertension, paroxysmal atrial fibrillation on sotalol and warfarin, hypertension, hyperlipidemia, HFpEF (LVEF 50% 01/2021) who presented to [facility] ED 01/21/2022 with 3 days of worsening shortness of breath.  Cardiology is consulted because of her elevated troponin.  #Elevated troponin likely 2/2 demand ischemia #Acute hypoxic respiratory failure #Community-acquired pneumonia #Palpitations Patient presents with 3 days of upper respiratory symptoms of nasal and chest congestion, productive cough, fatigue, chills and has noticed an increased heart rate mostly when coughing.  Her troponins are trended [phone] and she denies chest pain. Has  been feeling better since she has been in the ED. troponin elevation is likely due to demand ischemia and not ACS. -Agree with current therapy for treatment of pneumonia/upper respiratory symptoms -Wean oxygen as tolerated, uses 4 L nightly. -Continuous monitoring on telemetry while inpatient, will likely send patient home with live event monitor at discharge for further monitoring of arrhythmias with her palpitations -No indication for invasive cardiac diagnostics at this time.  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  -Continue sotalol 120 mg bid and warfarin. Current INR 1.9.  Appreciate pharmacy assistance with dosing Monitor potassium and magnesium daily CHA2DS2-VASc 3  #Hypertension She is on losartan 50 mg, HCTZ 12.5 as needed for weight gain at home.  Blood pressure goal at least 130  systolic. Hold these meds for now.  This patient's plan of care was discussed and created with Dr. Sena Slate and he is in agreement.  Signed: Rebeca Allegra , PA-C 01/21/2022, 7:50 AM [facility]

## 2022-01-21 NOTE — Progress Notes (Signed)
PROGRESS NOTE   Aleise Toolan  ZOX:[id number]    DOB: [birth year]    DOA: 01/21/2022  PCP: [facility]   I have briefly reviewed patients previous medical records in [facility].  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  Cough productive of blood tinged yellowish sputum.  Admitted for suspected community-acquired pneumonia complicated by acute respiratory failure with hypoxia and elevated troponin.  Cardiology consulted.   Assessment & Plan:  Principal Problem:   CAP (community acquired pneumonia) Active Problems:   Elevated troponin   Acute respiratory failure with hypoxia (HCC)   Atrial fibrillation (HCC)   HTN (hypertension)   Chronic combined systolic and diastolic CHF (congestive heart failure) (HCC)   Pulmonary hypertension (HCC)   VSD (ventricular septal defect)   Pericardial effusion   Assessment and Plan: * CAP (community acquired pneumonia) Rocephin and azithromycin Antitussives, DuoNebs as needed, decongestants as needed Supplemental oxygen to keep sats over 92% Slowly improving. Consider transitioning to oral antibiotics in the next 1 to 2 days, DC and outpatient follow-up.  Acute respiratory failure with hypoxia (HCC) Patient on nighttime 4 L/min O2 only at home, now requiring continuous O2. Tachypneic, conversational dyspnea on admission Continuous O2 to keep sats over 94 Reassess home oxygen needs at time of discharge  Elevated troponin Suspect demand ischemia from acute illness Continue to monitor Cardiology consultation appreciated.  Troponin peaked around  600. As per cardiology, suspecting demand ischemia in the setting of pneumonia and underlying cardiac conditions.  Recommend continuing aspirin and resuming her home meds.  Atrial fibrillation (HCC) On Coumadin.  INR 1.9.  Pharmacy to manage  VSD (ventricular septal defect) Unrepaired large muscular VSD followed by [facility]  Pulmonary hypertension (HCC) Continue Ambrisentan  Chronic combined systolic and diastolic CHF (congestive heart failure) (HCC) Compensated.  Continue losartan, sotalol, torsemide  HTN (hypertension) Continue losartan, HCTZ Controlled.  Pericardial effusion Moderate pericardial effusion on CT. No clinical tamponade. Review with cardiology in AM.    Body mass index is 24.21 kg/m.  Nutritional Status        Pressure Ulcer:     DVT prophylaxis:   Coumadin anticoagulation   Code Status: Full Code:  Family Communication: None at bedside Disposition:  Status is: Inpatient Remains inpatient appropriate because: Severity of illness and IV antibiotics    Consultants:   Cardiology  Procedures:     Antimicrobials:   As above   Subjective:  Reports that she is independent at home, lives with her spouse, on home oxygen 4 L/min at bedtime.  Follows with Dr. Monia Pouch, pulmonology.  Dyspnea much improved.  Still with some dyspnea on exertion.  Cough improved.  Objective:   Vitals:   01/21/22 1100 01/21/22 1338 01/21/22 1443 01/21/22 1753  BP: (!) 151/81 129/68 (!) 127/54 (!) 124/56  Pulse: 82 70 62 65  Resp: 17 20 (!) 21 20  Temp:    99.1 F (37.3 C)  TempSrc:      SpO2: 92% 94% 95% 93%  Weight:      Height:        General exam: Elderly female, moderately built  and nourished lying comfortably propped up in bed without distress. Respiratory system: Left basal crackles but otherwise clear to auscultation. Respiratory effort normal. Cardiovascular system: S1 & S2 heard, RRR. No JVD, murmurs, rubs, gallops or clicks. No pedal  edema. Gastrointestinal system: Abdomen is nondistended, soft and nontender. No organomegaly or masses felt. Normal bowel sounds heard. Central nervous system: Alert and oriented. No focal neurological deficits. Extremities: Symmetric 5 x 5 power. Skin: No rashes, lesions or ulcers Psychiatry: Judgement and insight appear normal. Mood & affect appropriate.     Data Reviewed:   I have personally reviewed following labs and imaging studies   CBC: Recent Labs  Lab 01/21/22 0135  WBC 7.9  NEUTROABS 5.2  HGB 14.2  HCT 45.2  MCV 90.4  PLT 189    Basic Metabolic Panel: Recent Labs  Lab 01/21/22 0135 01/21/22 0819  NA 138  --   K 3.9  --   CL 106  --   CO2 22  --   GLUCOSE 118*  --   BUN 15  --   CREATININE 0.58  --   CALCIUM 8.9  --   MG  --  1.9    Liver Function Tests: Recent Labs  Lab 01/21/22 0135  AST 22  ALT 18  ALKPHOS 54  BILITOT 0.6  PROT 7.2  ALBUMIN 4.0    CBG: No results for input(s): GLUCAP in the last 168 hours.  Microbiology Studies:   Recent Results (from the past 240 hour(s))  Resp Panel by RT-PCR (Flu A&B, Covid) Nasopharyngeal Swab     Status: None   Collection Time: 01/21/22  1:35 AM   Specimen: Nasopharyngeal Swab; Nasopharyngeal(NP) swabs in vial transport medium  Result Value Ref Range Status   SARS Coronavirus 2 by RT PCR NEGATIVE NEGATIVE Final    Comment: (NOTE) SARS-CoV-2 target nucleic acids are NOT DETECTED.  The SARS-CoV-2 RNA is generally detectable in upper respiratory specimens during the acute phase of infection. The lowest concentration of SARS-CoV-2 viral copies this assay can detect is 138 copies/mL. A negative result does not preclude SARS-Cov-2 infection and should not be used as the sole basis for treatment or other patient management decisions. A negative result may occur with  improper specimen collection/handling, submission of specimen other than nasopharyngeal swab, presence of viral mutation(s) within  the areas targeted by this assay, and inadequate number of viral copies(<138 copies/mL). A negative result must be combined with clinical observations, patient history, and epidemiological information. The expected result is Negative.  Fact Sheet for Patients:  BloggerCourse.com  Fact Sheet for Healthcare Providers:  SeriousBroker.it  This test is no t yet approved or cleared by the [place] FDA and  has been authorized for detection and/or diagnosis of SARS-CoV-2 by FDA under an Emergency Use Authorization (EUA). This EUA will remain  in effect (meaning this test can be used) for the duration of the COVID-19 declaration under Section 564(b)(1) of the Act, 21 U.S.C.section 360bbb-3(b)(1), unless the authorization is terminated  or revoked sooner.       Influenza A by PCR NEGATIVE NEGATIVE Final   Influenza B by PCR NEGATIVE NEGATIVE Final    Comment: (NOTE) The Xpert Xpress SARS-CoV-2/FLU/RSV plus assay is intended as an aid in the diagnosis of influenza from Nasopharyngeal swab specimens and should not be used as a sole basis for treatment. Nasal washings and aspirates are unacceptable for Xpert Xpress SARS-CoV-2/FLU/RSV testing.  Fact Sheet for Patients: BloggerCourse.com  Fact Sheet for Healthcare Providers:  SeriousBroker.it  This test is not yet approved or cleared by the [place] and has been authorized for detection and/or diagnosis of SARS-CoV-2 by FDA under an Emergency Use Authorization (EUA). This EUA will remain in effect (meaning this test can be used) for the duration of the COVID-19 declaration under Section 564(b)(1) of the Act, 21 U.S.C. section 360bbb-3(b)(1), unless the authorization is terminated or revoked.  Performed at [facility], [address] [place], [place] [address]     Radiology Studies:  CT Chest Wo  Contrast  Result Date: 01/21/2022 CLINICAL DATA:  Initial evaluation for acute productive cough, congestion, shortness of breath. EXAM: CT CHEST WITHOUT CONTRAST TECHNIQUE: Multidetector CT imaging of the chest was performed following the standard protocol without IV contrast. RADIATION DOSE REDUCTION: This exam was performed according to the departmental dose-optimization program which includes automated exposure control, adjustment of the mA and/or kV according to patient size and/or use of iterative reconstruction technique. COMPARISON:  Prior radiograph from earlier the same day as well as prior chest CT from 02/23/2011. FINDINGS: Cardiovascular: Intrathoracic aorta normal in caliber. Mild atherosclerotic change. Visualized great vessels grossly within normal limits. Prominent cardiomegaly noted. Moderate pericardial effusion measuring simple fluid density noted. Main pulmonary artery dilated up to 3.9 cm, which could reflect changes of underlying pulmonary hypertension. Mediastinum/Nodes: Visualized thyroid within normal limits. No visible enlarged mediastinal or hilar lymph nodes on this noncontrast examination. No axillary adenopathy. Esophagus within normal limits. Lungs/Pleura: Tracheobronchial tree intact and patent. Mild volume loss within the left lung with associated scattered subsegmental atelectasis within the lingula and left lower lobe. Small area of patchy and nodular densities within the posterior left upper lobe (series 3, images 39, 44), nonspecific, but suspected to reflect a small area of mild infection/pneumonitis given provided history. No other focal infiltrates or consolidative opacity. No pulmonary edema or pleural effusion. No pneumothorax. No other visible pulmonary nodule or mass. Upper Abdomen: Visualized upper abdomen demonstrates no acute finding. 8 mm hyperdensity at the posterior aspect of the visualized left kidney noted, nonspecific, but likely a small proteinaceous and/or  hemorrhagic cyst. Musculoskeletal: Visualized external soft tissues demonstrate no acute finding. No acute osseous finding. No discrete or worrisome osseous lesions. Moderate multilevel thoracic spondylosis noted. IMPRESSION: 1. Small area of patchy and nodular densities within the posterior left upper lobe, nonspecific, but suspected to reflect a small area of mild infection/pneumonitis given provided history. 2. Cardiomegaly with moderate pericardial effusion. 3. Dilatation of the main pulmonary artery up to 3.9 cm, which could reflect underlying pulmonary hypertension. 4. No other acute cardiopulmonary abnormality identified. Electronically Signed   By: Rise Mu M.D.   On: 01/21/2022 02:51   DG Chest Portable 1 View  Result Date: 01/21/2022 CLINICAL DATA:  Congestion and productive cough. EXAM: PORTABLE CHEST 1 VIEW COMPARISON:  September 30, 2018 FINDINGS: Mild, stable, diffusely increased lung markings are seen without evidence of focal consolidation, pleural effusion or pneumothorax. There is stable moderate to marked severity cardiomegaly with prominence of the perihilar pulmonary vasculature. The visualized skeletal structures are unremarkable. IMPRESSION: Stable cardiomegaly with pulmonary vascular congestion. Electronically Signed   By: Aram Candela M.D.   On: 01/21/2022 01:53    Scheduled Meds:    ambrisentan  5 mg Oral Daily   sotalol  120 mg Oral Q12H   Warfarin - Pharmacist Dosing Inpatient   Does not apply q1600    Continuous Infusions:    azithromycin Stopped (01/21/22 0527)   cefTRIAXone (ROCEPHIN)  IV       LOS: 0 days     Marcellus Scott, MD,  FACP, [facility], [facility], [facility] (Care Management Physician Certified) [facility] [place]  To contact the attending provider between 7A-7P or the covering provider during after hours 7P-7A, please log into the web site [email or web address] and access using universal [place] password for that web  site. If you do not have the password, please call the hospital operator.  01/21/2022, 6:38 PM

## 2022-01-21 NOTE — Progress Notes (Signed)
ANTICOAGULATION CONSULT NOTE - Initial Consult ? ?Pharmacy Consult for warfarin ?Indication: atrial fibrillation ? ?Allergies  ?Allergen Reactions  ? Penicillins Other (See Comments)  ?  Has patient had a PCN reaction causing immediate rash, facial/tongue/throat swelling, SOB or lightheadedness with hypotension: no ?Has patient had a PCN reaction causing severe rash involving mucus membranes or skin necrosis: no ?Has patient had a PCN reaction that required hospitalization no ?Has patient had a PCN reaction occurring within the last 10 years: no ?If all of the above answers are "NO", then may proceed with Cephalosporin use. ? ?Fever 105   ? Sulfa Antibiotics   ? Tylenol [Acetaminophen]   ? ? ?Patient Measurements: ?Height: 5\' 6"  (167.6 cm) ?Weight: 68 kg (150 lb) ?IBW/kg (Calculated) : 59.3 ?Heparin Dosing Weight:   ? ?Vital Signs: ?BP: 127/54 (03/08 1443) ?Pulse Rate: 62 (03/08 1443) ? ?Labs: ?Recent Labs  ?  01/21/22 ?0135 01/21/22 ?0322 01/21/22 ?03/23/22 01/21/22 ?1121  ?HGB 14.2  --   --   --   ?HCT 45.2  --   --   --   ?PLT 189  --   --   --   ?APTT  --  42*  --   --   ?LABPROT  --  22.2*  --   --   ?INR  --  1.9*  --   --   ?CREATININE 0.58  --   --   --   ?TROPONINIHS 101* 221* 570* 495*  ? ? ?Estimated Creatinine Clearance: 57.8 mL/min (by C-G formula based on SCr of 0.58 mg/dL). ? ? ?Medical History: ?Past Medical History:  ?Diagnosis Date  ? Anxiety   ? Atrial fibrillation (HCC)   ? CHF (congestive heart failure) (HCC)   ? GERD (gastroesophageal reflux disease)   ? HLD (hyperlipidemia)   ? Hypertension   ? Pulmonary hypertension (HCC)   ? VSD (ventricular septal defect)   ? ? ?Medications:  ?(Not in a hospital admission)  ?Scheduled:  ? ambrisentan  5 mg Oral Daily  ? enoxaparin (LOVENOX) injection  40 mg Subcutaneous Q24H  ? sotalol  120 mg Oral Q12H  ? warfarin  7 mg Oral ONCE-1600  ? Warfarin - Pharmacist Dosing Inpatient   Does not apply q1600  ? ?Infusions:  ? azithromycin Stopped (01/21/22 0527)  ?  cefTRIAXone (ROCEPHIN)  IV    ? ? ?Assessment: ?76 yo F continuing warfarin therapy for Afib. ?Baseline labs:  hgb 14.2  Plt 189 ?Warfarin home dose:  7 mg Tues, Thur and 6 mg Mon, Wed, Fri,Sat,Sun ? ? ?3/8 INR 1.9 ? ? ?DDI: Azithromycin ? ? ?Goal of Therapy:  ?INR 2-3 ?Monitor platelets by anticoagulation protocol: Yes ?  ?Plan:  ?INR 1.9, subtherapeutic ?Will order 7 mg Warfarin today x 1 dose ?Pt received lovenox 40 mg 3/8 @0900 . ?Watch DDI ?F/u INR in am ?  ? ?Andrik Sandt A ?01/21/2022,3:49 PM ? ? ?

## 2022-01-21 NOTE — Assessment & Plan Note (Addendum)
On Coumadin.  INR therapeutic at 2.3. ?Continue prior home dose of Coumadin and outpatient follow-up with her Coumadin clinic for further management. ?Patient initially presented with palpitations but telemetry has not shown any arrhythmias and continues to show SB in the 50s-SR with occasional PVCs ?

## 2022-01-21 NOTE — Assessment & Plan Note (Addendum)
Compensated.  Continue losartan, sotalol.  Patient uses as needed HCTZ and does not take torsemide. ?TTE results as above. ?

## 2022-01-21 NOTE — Progress Notes (Addendum)
Pharmacy Consult for Pulmonary Hypertension Treatment  ? ?Indication - Continuation of prior to admission medication  ? ?Patient is 76 y.o.  with history of PAH on chronic Ambrisentan (Letairis) PTA and will be continued while hospitalized.  ? ?Continuing this medication order as an inpatient requires that monitoring parameters per REMS requirements must be met. ? ?Chronic therapy is under the supervision of Dr. Rich Reining, MD who is enrolled in the REMS program and is being notified of continuation of therapy. A call has been made to provider's Clinic  ?Per patient report has previously been educated on Hepatotoxicity . On admission pregnancy risk has been assessed and no monitoring required. ?Hepatic function has been evaluated. AST/ALT appropriate to continue medication at this time. ? ?Hepatic Function Latest Ref Rng & Units 01/21/2022 09/30/2018 02/02/2013  ?Total Protein 6.5 - 8.1 g/dL 7.2 7.1 7.6  ?Albumin 3.5 - 5.0 g/dL 4.0 3.9 3.7  ?AST 15 - 41 U/L 22 13(L) 19  ?ALT 0 - 44 U/L 18 10 23   ?Alk Phosphatase 38 - 126 U/L 54 48 58  ?Total Bilirubin 0.3 - 1.2 mg/dL 0.6 0.7 0.4  ? ? ?If any question arise or pregnancy is identified during hospitalization, contact for bosentan and macitentan: [phone]; ambrisentan: [phone]. ? ?Thank for you allowing [place] to participate in the care of this patient.  ? ?Patient REMS ID: [id number] ? ?Dorothe Pea, PharmD, BCPS ?Clinical Pharmacist   ?

## 2022-01-21 NOTE — Assessment & Plan Note (Addendum)
Suspect demand ischemia from acute illness ?Cardiology consultation appreciated.  Troponin peaked around 600. ?As per cardiology, suspecting demand ischemia in the setting of pneumonia and underlying cardiac conditions.  Recommend continuing aspirin and the rest of her home meds have been resumed. ?2D echo 3/9: Large muscular VSD, LVEF 50-55%.  No LV regional wall motion abnormalities.  Grade 2 diastolic dysfunction.  Moderate pericardial effusion, circumferential with no evidence of cardiac tamponade. ?Troponins down to 78 but EKG with new T wave inversions and ST depressions in anterior lateral leads, cardiology was concerned and reviewed case in detail with patient's primary cardiologist Dr. Monia Pouch on day of discharge and advised that patient could be discharged home with close outpatient follow-up with her primary cardiologist.  Aspirin has been resumed.  No anginal symptoms. ?

## 2022-01-21 NOTE — ED Notes (Signed)
Pt resting comfortably at this time. NAD noted. Pt A&Ox4. Pt denies any needs at this time. Call bell in reach.  ?

## 2022-01-21 NOTE — Hospital Course (Addendum)
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  Cough productive of blood tinged yellowish sputum.  Admitted for suspected community-acquired pneumonia complicated by acute respiratory failure with hypoxia and elevated troponin.  Cardiology consulted.  Patient was about to DC on 3/9 but noted EKG with prolonged QTc >500 and discharge was canceled.  Azithromycin discontinued due to possible interaction with sotalol.  EKG 3/10 with improved QTc, in the 470 ms range.  Patient however with some?  Dysphagia/pain on swallowing, back pain which has since improved on PPI, suspecting due to GERD.  Cardiology reassessed her today, EKG with QTc in the 470 range, they discussed in detail with patient's primary cardiologist and have cleared her for discharge home.  Primary cardiologist will arrange heart monitor on Monday, 01/26/2022. ?

## 2022-01-21 NOTE — Assessment & Plan Note (Addendum)
Continue losartan, HCTZ which she uses as needed. ?Controlled. ?

## 2022-01-21 NOTE — Assessment & Plan Note (Addendum)
Moderate pericardial effusion on CT. ?No tamponade clinically or on 2D echo. ?Cardiology is aware. ?

## 2022-01-21 NOTE — ED Provider Notes (Signed)
? ?[facility] ?Provider Note ? ? ? Event Date/Time  ? First MD Initiated Contact with Patient 01/21/22 0125   ?  (approximate) ? ? ?History  ? ?Cough and Shortness of Breath ? ? ?HPI ? ?Sacora Hawbaker is a 76 y.o. 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  Symptom onset has been for 3 days.  Patient endorses significant congestion in her chest and nose.  She has had intermittent productive cough with some streaks of blood that is yellow.  Denies significant shortness of breath she is not having chest pain.  Denies fevers chills.  Still eating and drinking.  Nausea but no vomiting.  Denies abdominal pain.  No lower extremity edema.  Patient tells me she wears 4 L nasal cannula only at night and that her baseline oxygen saturation is in the high 80s.  Patient notes that when she has been coughing her heart rate has been quite high up to the 120s.  Because it was not improving this was the primary reason why she came to the emergency department. ? ?Last cardiology note from 11/2021 and her O2 sat is 89% and it is commented that she wears 4 L just at night.  ?  ? ?Past Medical History:  ?Diagnosis Date  ? Anxiety   ? Atrial fibrillation (HCC)   ? CHF (congestive heart failure) (HCC)   ? GERD (gastroesophageal reflux disease)   ? HLD (hyperlipidemia)   ? Hypertension   ? Pulmonary hypertension (HCC)   ? VSD (ventricular septal defect)   ? ? ?Patient Active Problem List  ? Diagnosis Date Noted  ? Sepsis (HCC) 11/12/2016  ? CAP (community acquired pneumonia) 11/12/2016  ? HTN (hypertension) 11/12/2016  ? HLD (hyperlipidemia) 11/12/2016  ? GERD (gastroesophageal reflux disease) 11/12/2016  ? Anxiety 11/12/2016  ? Chronic combined systolic and diastolic CHF (congestive heart failure) (HCC)  11/12/2016  ? Atrial fibrillation (HCC) 11/12/2016  ? Elevated troponin 11/12/2016  ? ? ? ?Physical Exam  ?Triage Vital Signs: ?ED Triage Vitals [01/21/22 0114]  ?Enc Vitals Group  ?   BP (!) 171/100  ?   Pulse Rate 96  ?   Resp 18  ?   Temp 98.9 ?F (37.2 ?C)  ?   Temp Source Oral  ?   SpO2 (!) 89 %  ?   Weight 150 lb (68 kg)  ?   Height 5\' 6"  (1.676 m)  ?   Head Circumference   ?   Peak Flow   ?   Pain Score 0  ?   Pain Loc   ?   Pain Edu?   ?   Excl. in GC?   ? ? ?Most recent vital signs: ?Vitals:  ? 01/21/22 0200 01/21/22 0300  ?BP: (!) 157/82 (!) 153/75  ?Pulse: 97 83  ?Resp: 11 (!) 25  ?Temp:    ?SpO2: 95% 92%  ? ? ? ?General: Awake, no distress.  ?CV:  Good peripheral perfusion.  No lower extremity edema ?Resp:  Normal effort.  Lungs are clear, no increased work of breathing ?Abd:  No distention.  Soft and nontender ?Neuro:             Awake, Alert, Oriented x 3  ?Other:   ? ? ?ED Results / Procedures / Treatments  ?Labs ?(all labs ordered are listed,  but only abnormal results are displayed) ?Labs Reviewed  ?COMPREHENSIVE METABOLIC PANEL - Abnormal; Notable for the following components:  ?    Result Value  ? Glucose, Bld 118 (*)   ? All other components within normal limits  ?BRAIN NATRIURETIC PEPTIDE - Abnormal; Notable for the following components:  ? B Natriuretic Peptide 314.0 (*)   ? All other components within normal limits  ?TROPONIN I (HIGH SENSITIVITY) - Abnormal; Notable for the following components:  ? Troponin I (High Sensitivity) 101 (*)   ? All other components within normal limits  ?RESP PANEL BY RT-PCR (FLU A&B, COVID) ARPGX2  ?CBC WITH DIFFERENTIAL/PLATELET  ?PROTIME-INR  ?APTT  ?TROPONIN I (HIGH SENSITIVITY)  ? ? ? ?EKG ?Reviewed the patient's x-ray which shows cardiomegaly, pulmonary vascular congestion versus interstitial viral pattern ? ? ? ?RADIOLOGY ?EKG interpreted by myself, borderline right axis deviation, normal sinus rhythm, findings of RVH, T wave inversions throughout the  precordial leads, similar to prior EKG ? ? ?PROCEDURES: ? ?Critical Care performed: No ? ?.1-3 Lead EKG Interpretation ?Performed by: Georga HackingMcHugh, Payden Bonus Rose, MD ?Authorized by: Georga HackingMcHugh, Konnor Vondrasek Rose, MD  ? ?  Interpretation: normal   ?  ECG rate assessment: normal   ?  Rhythm: sinus rhythm   ?  Ectopy: none   ?  Conduction: normal   ? ?The patient is on the cardiac monitor to evaluate for evidence of arrhythmia and/or significant heart rate changes. ? ? ?MEDICATIONS ORDERED IN ED: ?Medications  ?cefTRIAXone (ROCEPHIN) 1 g in sodium chloride 0.9 % 100 mL IVPB (1 g Intravenous New Bag/Given 01/21/22 0322)  ?azithromycin (ZITHROMAX) 500 mg in sodium chloride 0.9 % 250 mL IVPB (has no administration in time range)  ? ? ? ?IMPRESSION / MDM / ASSESSMENT AND PLAN / ED COURSE  ?I reviewed the triage vital signs and the nursing notes. ?             ?               ? ?Differential diagnosis includes, but is not limited to, pneumonia, CHF, bronchitis, ACS, embolism ?The patient is 76 year old female with history of pulmonary hypertension, large VSD with baseline oxygen saturations in the high 80s and on 4 L nasal cannula only at night who presents primarily with cough and congestion as well as feeling like her heart rate was rapid.  Symptoms of been going on for about 3 days.  In the ED she is on 4 L nasal cannula satting around 94%.  She appears well is not in overt respiratory distress.  Her lung sounds clear and she has no peripheral edema to suggest volume overload.  Reviewed patient's x-ray which shows cardiomegaly, there is potentially pulmonary vascular congestion versus a viral interstitial pattern.  Radiology comments on congestion.  Patient's EKG with chronic changes no acute changes.  I suspect pneumonia versus bronchitis.  With her blood-tinged sputum consider pulmonary embolism however she is on Coumadin and without chest pain or increasing dyspnea and the cough and congestion I suspect infectious etiology more  likely. ? ?Patient CT of the chest does show a potential area of pneumonia.  Her troponin is elevated at 100.  BNP mildly elevated at 300.  Troponin repeat pending.  When taken off oxygen patient desats to 88%.  Given the increased oxygen requirement and otherwise tenuous respiratory status will admit for IV antibiotics and observation. ? ?Clinical Course as of 01/21/22 0323  ?Wed Jan 21, 2022  ?0227 RDW: 15.2 [KM]  ?  ?  Clinical Course User Index ?[KM] Georga Hacking, MD  ? ? ? ?FINAL CLINICAL IMPRESSION(S) / ED DIAGNOSES  ? ?Final diagnoses:  ?Community acquired pneumonia of left upper lobe of lung  ? ? ? ?Rx / DC Orders  ? ?ED Discharge Orders   ? ? None  ? ?  ? ? ? ?Note:  This document was prepared using Dragon voice recognition software and may include unintentional dictation errors. ?  ?Georga Hacking, MD ?01/21/22 [phone] ? ?

## 2022-01-21 NOTE — Assessment & Plan Note (Signed)
Unrepaired large muscular VSD followed by [facility] ?

## 2022-01-21 NOTE — ED Notes (Signed)
Light green and Lavender top sent to lab ?

## 2022-01-22 ENCOUNTER — Inpatient Hospital Stay
Admit: 2022-01-22 | Discharge: 2022-01-22 | Disposition: A | Discharge status: Home / Self Care | Payer: Medicare Other | Attending: Cardiology | Admitting: Cardiology

## 2022-01-22 DIAGNOSIS — J9601 Acute respiratory failure with hypoxia: Secondary | ICD-10-CM | POA: Diagnosis not present

## 2022-01-22 DIAGNOSIS — J189 Pneumonia, unspecified organism: Secondary | ICD-10-CM | POA: Diagnosis not present

## 2022-01-22 LAB — MAGNESIUM
Magnesium: 2 mg/dL (ref 1.7–2.4)
Magnesium: 1.9 mg/dL (ref 1.7–2.4)

## 2022-01-22 LAB — CBC
HCT: 43.5 % (ref 36.0–46.0)
Hemoglobin: 13.5 g/dL (ref 12.0–15.0)
MCH: 28.4 pg (ref 26.0–34.0)
MCHC: 31 g/dL (ref 30.0–36.0)
MCV: 91.4 fL (ref 80.0–100.0)
Platelets: 169 10*3/uL (ref 150–400)
RBC: 4.76 MIL/uL (ref 3.87–5.11)
RDW: 15.3 % (ref 11.5–15.5)
WBC: 6.3 10*3/uL (ref 4.0–10.5)
nRBC: 0 % (ref 0.0–0.2)

## 2022-01-22 LAB — BASIC METABOLIC PANEL
Anion gap: 7 (ref 5–15)
BUN: 15 mg/dL (ref 8–23)
CO2: 23 mmol/L (ref 22–32)
Calcium: 8.8 mg/dL — ABNORMAL LOW (ref 8.9–10.3)
Chloride: 108 mmol/L (ref 98–111)
Creatinine, Ser: 0.6 mg/dL (ref 0.44–1.00)
GFR, Estimated: >60 mL/min (ref >60)
Glucose, Bld: 87 mg/dL (ref 70–99)
Potassium: 4.1 mmol/L (ref 3.5–5.1)
Sodium: 138 mmol/L (ref 135–145)

## 2022-01-22 LAB — ECHOCARDIOGRAM COMPLETE
AR max vel: 3.27 cm2
AV Area VTI: 3.08 cm2
AV Area mean vel: 2.86 cm2
AV Mean grad: 2 mmHg
AV Peak grad: 3.6 mmHg
Ao pk vel: 0.95 m/s
Area-P 1/2: 3.21 cm2
Height: 66 in
MV VTI: 1.93 cm2
S' Lateral: 4 cm
Weight: 2400 oz

## 2022-01-22 LAB — HEPATIC FUNCTION PANEL
ALT: 13 U/L (ref 0–44)
AST: 19 U/L (ref 15–41)
Albumin: 3.6 g/dL (ref 3.5–5.0)
Alkaline Phosphatase: 47 U/L (ref 38–126)
Bilirubin, Direct: <0.1 mg/dL (ref 0.0–0.2)
Total Bilirubin: 0.6 mg/dL (ref 0.3–1.2)
Total Protein: 6.3 g/dL — ABNORMAL LOW (ref 6.5–8.1)

## 2022-01-22 LAB — POTASSIUM: Potassium: 3.7 mmol/L (ref 3.5–5.1)

## 2022-01-22 LAB — PROTIME-INR
INR: 2.4 — ABNORMAL HIGH (ref 0.8–1.2)
Prothrombin Time: 25.8 seconds — ABNORMAL HIGH (ref 11.4–15.2)

## 2022-01-22 MED ORDER — CEFPODOXIME PROXETIL 200 MG PO TABS: 200.0000 mg | ORAL_TABLET | Freq: Two times a day (BID) | ORAL | 0 refills | Status: DC

## 2022-01-22 MED ORDER — SODIUM CHLORIDE 0.9 % IV SOLN
INTRAVENOUS | Status: DC | PRN
Start: 2022-01-22 — End: 2022-01-24

## 2022-01-22 MED ORDER — DOXYCYCLINE HYCLATE 100 MG PO TABS: 100.0000 mg | ORAL_TABLET | Freq: Two times a day (BID) | ORAL | 0 refills | Status: DC

## 2022-01-22 MED ORDER — DOXYCYCLINE HYCLATE 100 MG PO TABS: 100.0000 mg | ORAL_TABLET | Freq: Two times a day (BID) | ORAL | Status: DC

## 2022-01-22 MED ORDER — POTASSIUM CHLORIDE CRYS ER 20 MEQ PO TBCR
40.0000 meq | EXTENDED_RELEASE_TABLET | Freq: Once | ORAL | Status: AC
Start: 2022-01-22 — End: 2022-01-22
  Administered 2022-01-22: 18:00:00 40 meq via ORAL
  Filled 2022-01-22: qty 2

## 2022-01-22 MED ORDER — SALINE SPRAY 0.65 % NA SOLN
1.0000 | NASAL | Status: DC | PRN
  Filled 2022-01-22: qty 44

## 2022-01-22 MED ORDER — DOXYCYCLINE HYCLATE 100 MG PO TABS
100.0000 mg | ORAL_TABLET | Freq: Two times a day (BID) | ORAL | Status: DC
  Administered 2022-01-23: 100 mg via ORAL
  Filled 2022-01-22: qty 1

## 2022-01-22 MED ORDER — MAGNESIUM SULFATE IN D5W 1-5 GM/100ML-% IV SOLN
1.0000 g | Freq: Once | INTRAVENOUS | Status: AC
  Administered 2022-01-22: 18:00:00 1 g via INTRAVENOUS
  Filled 2022-01-22: qty 100

## 2022-01-22 MED ORDER — WARFARIN SODIUM 5 MG PO TABS
5.0000 mg | ORAL_TABLET | Freq: Once | ORAL | Status: AC
  Administered 2022-01-22: 18:00:00 5 mg via ORAL
  Filled 2022-01-22: qty 1

## 2022-01-22 NOTE — Progress Notes (Addendum)
SATURATION QUALIFICATIONS: (This note is used to comply with regulatory documentation for home oxygen) ? ?Patient Saturations on Room Air at Rest = 91% ? ?Patient Saturations on Room Air while Ambulating = 85% ? ?Patient Saturations on 3 Liters of oxygen while Ambulating = 88-89% ? ?Please briefly explain why patient needs home oxygen: mild SOB while ambulating ?

## 2022-01-22 NOTE — Progress Notes (Signed)
?  Transition of Care (TOC) Screening Note ? ? ?Patient Details  ?Name: Haley Adams ?Date of Birth: Mar 18, 1946 ? ? ?Transition of Care (TOC) CM/SW Contact:    ?Gildardo Griffes, LCSW ?Phone Number: ?01/22/2022, 10:15 AM ? ? ? ?Transition of Care Department [facility]) has reviewed patient and no TOC needs have been identified at this time. We will continue to monitor patient advancement through interdisciplinary progression rounds. If new patient transition needs arise, please place a TOC consult. ? Angeline Slim, [place] ?[phone] ? ?

## 2022-01-22 NOTE — TOC Transition Note (Addendum)
Transition of Care (TOC) - CM/SW Discharge Note ? ? ?Patient Details  ?Name: Haley Adams ?MRN: [id number] ?Date of Birth: 08-19-1946 ? ?Transition of Care (TOC) CM/SW Contact:  ?Gildardo Griffes, LCSW ?Phone Number: ?01/22/2022, 11:41 AM ? ? ?Clinical Narrative:    ? ?Patient should dc home today with home/self care, with oxygen. Patient reports being active with Trudie Reed with [facility] and reports they are bringing portable tank to room for continuous oxygen as patient was only getting nocturnal oxygen through them.  ? ?No other needs identified at this time.  ? ?Final next level of care: Home/Self Care ?Barriers to Discharge: No Barriers Identified ? ? ?Patient Goals and CMS Choice ?  ?CMS Medicare.gov Compare Post Acute Care list provided to:: Patient ?Choice offered to / list presented to : Patient ? ?Discharge Placement ?  ?           ?  ?  ?  ?  ? ?Discharge Plan and Services ?  ?  ?           ?DME Arranged: Oxygen ?DME Agency: [employer] ?  ?  ?  ?  ?  ?  ?  ?  ? ?Social Determinants of Health (SDOH) Interventions ?  ? ? ?Readmission Risk Interventions ?No flowsheet data found. ? ? ? ? ?

## 2022-01-22 NOTE — Care Management CC44 (Signed)
Condition Code 44 Documentation Completed ? ?Patient Details  ?Name: Haley Adams ?MRN: [id number] ?Date of Birth: 10-Jul-1946 ? ? ?Condition Code 44 given:  Yes ?Patient signature on Condition Code 44 notice:    ?Documentation of 2 MD's agreement:  Yes ?Code 44 added to claim:  Yes ? ? ? ?Toombs Cellar, RN ?01/22/2022, 1:49 PM ? ?

## 2022-01-22 NOTE — Progress Notes (Addendum)
[facility] CLINIC CARDIOLOGY CONSULT NOTE       Patient ID: Haley Adams MRN: [id number] DOB/AGE: [birth year] 76 y.o.  Admit date: 01/21/2022 Referring Physician Dr. 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  Cardiology is consulted because of her elevated troponin.  Interval History:  -feels a little better today, admits to coughing more but less frequent episodes of heart racing. No chest pain.  -review of tele shows sinus rhythm with a few PVCs, but no sustained arrhythmias -weaned to RA during interview and sats at 91, ambulating on room air 85% - notably her baseline when she is feeling well is about 90-91% per her reporting  Review of systems complete and found to be negative unless listed above   Past Medical History:  Diagnosis Date   Anxiety    Atrial fibrillation (HCC)    CHF (congestive heart failure) (HCC)    GERD (gastroesophageal reflux disease)    HLD (hyperlipidemia)    Hypertension    Pulmonary hypertension (HCC)    VSD (ventricular septal defect)     Past Surgical History:  Procedure Laterality Date   CARDIAC CATHETERIZATION     EYE SURGERY     TOTAL ABDOMINAL HYSTERECTOMY W/ BILATERAL SALPINGOOPHORECTOMY      Medications Prior to Admission  Medication Sig Dispense Refill Last Dose   ambrisentan (LETAIRIS) 5 MG tablet Take 5 mg by mouth daily.   01/20/2022 at 2000   amLODipine (NORVASC) 2.5 MG tablet Take 2.5 mg by mouth daily.   01/20/2022 at 1000   diazepam (VALIUM) 2 MG tablet Take 2 mg by  mouth every 6 (six) hours as needed for anxiety.   prn at prn   hydrochlorothiazide (MICROZIDE) 12.5 MG capsule Take 12.5 mg by mouth as directed. Take 12.5 mg twice a week as directed by your doctor.   01/18/2022 at 1000   losartan (COZAAR) 50 MG tablet Take 50 mg by mouth 2 (two) times daily.   01/20/2022 at 2000   sertraline (ZOLOFT) 25 MG tablet Take 25 mg by mouth daily.   01/20/2022 at 2000   sodium chloride (OCEAN) 0.65 % SOLN nasal spray Place 1 spray into both nostrils as needed for congestion. 1 Bottle 0 prn at prn   sotalol (BETAPACE) 120 MG tablet Take 120 mg by mouth 2 (two) times daily.   01/20/2022 at 2000   traZODone (DESYREL) 50 MG tablet Take 25-50 mg by mouth at bedtime as needed for sleep.   prn at prn   warfarin (COUMADIN) 1 MG tablet Take 1-2 mg by mouth as directed. On Tuesday and Thursday take 2 (two) tablets with one 5 mg tablet for a total dose of 7 mg. On all other days take 1 (one) tablet with one 5 mg tablet for a total dose of 6 mg.   01/20/2022 at 2000   warfarin (COUMADIN) 5 MG tablet Take 5 mg by mouth daily.   01/20/2022 at 2000   aspirin EC 81 MG tablet Take 81 mg by mouth daily. (Patient not taking: Reported on 01/21/2022)   Not Taking   torsemide (DEMADEX) 10 MG tablet Take 10 mg  by mouth as needed. (Patient not taking: Reported on 01/21/2022)   Not Taking    Social History   Socioeconomic History   Marital status: Married    Spouse name: Not on file   Number of children: Not on file   Years of education: Not on file   Highest education level: Not on file  Occupational History   Not on file  Tobacco Use   Smoking status: Never   Smokeless tobacco: Never  Vaping Use   Vaping Use: Never used  Substance and Sexual Activity   Alcohol use: No   Drug use: No   Sexual activity: Not on file  Other Topics Concern   Not on file  Social History Narrative   Not on file   Social Determinants of Health   Financial Resource Strain: Not on file  Food Insecurity: Not on  file  Transportation Needs: Not on file  Physical Activity: Not on file  Stress: Not on file  Social Connections: Not on file  Intimate Partner Violence: Not on file    Family History  Problem Relation Age of Onset   Parkinsonism Mother    COPD Father    Cancer Brother    Aneurysm Brother       Review of systems complete and found to be negative unless listed above    PHYSICAL EXAM General: Very pleasant elderly black female, well nourished, in no acute distress. Resting at incline in PCU bed.  HEENT:  Normocephalic and atraumatic. Neck:  No JVD.  Lungs: Normal respiratory effort on room air.  Decreased bibasilar breath sounds without wheezes, crackles. heart: HRRR . Normal S1 and S2.  3/6 systolic murmur.  Radial & DP pulses 2+ bilaterally. Abdomen: Soft, nontender .  Msk: Normal strength and tone for age. Extremities: Warm and well perfused. No clubbing, cyanosis.  No lower extremity edema.  Neuro: Alert and oriented X 3. Psych:  Answers questions appropriately.   Labs:   Lab Results  Component Value Date   WBC 6.3 01/22/2022   HGB 13.5 01/22/2022   HCT 43.5 01/22/2022   MCV 91.4 01/22/2022   PLT 169 01/22/2022    Recent Labs  Lab 01/21/22 0135 01/22/22 0539  NA 138 138  K 3.9 4.1  CL 106 108  CO2 22 23  BUN 15 15  CREATININE 0.58 0.60  CALCIUM 8.9 8.8*  PROT 7.2  --   BILITOT 0.6  --   ALKPHOS 54  --   ALT 18  --   AST 22  --   GLUCOSE 118* 87    Lab Results  Component Value Date   CKTOTAL 56 02/03/2013   CKMB 1.5 02/03/2013   TROPONINI <0.03 09/30/2018     Lab Results  Component Value Date   CHOL 226 (H) 02/03/2013   CHOL 180 07/09/2012   Lab Results  Component Value Date   HDL 37 (L) 02/03/2013   HDL 48 07/09/2012   Lab Results  Component Value Date   LDLCALC 152 (H) 02/03/2013   LDLCALC 111 (H) 07/09/2012   Lab Results  Component Value Date   TRIG 183 02/03/2013   TRIG 104 07/09/2012   No results found for: CHOLHDL No  results found for: LDLDIRECT    Radiology: CT Chest Wo Contrast  Result Date: 01/21/2022 CLINICAL DATA:  Initial evaluation for acute productive cough, congestion, shortness of breath. EXAM: CT CHEST WITHOUT CONTRAST TECHNIQUE: Multidetector CT imaging of the chest was performed following the standard protocol  without IV contrast. RADIATION DOSE REDUCTION: This exam was performed according to the departmental dose-optimization program which includes automated exposure control, adjustment of the mA and/or kV according to patient size and/or use of iterative reconstruction technique. COMPARISON:  Prior radiograph from earlier the same day as well as prior chest CT from 02/23/2011. FINDINGS: Cardiovascular: Intrathoracic aorta normal in caliber. Mild atherosclerotic change. Visualized great vessels grossly within normal limits. Prominent cardiomegaly noted. Moderate pericardial effusion measuring simple fluid density noted. Main pulmonary artery dilated up to 3.9 cm, which could reflect changes of underlying pulmonary hypertension. Mediastinum/Nodes: Visualized thyroid within normal limits. No visible enlarged mediastinal or hilar lymph nodes on this noncontrast examination. No axillary adenopathy. Esophagus within normal limits. Lungs/Pleura: Tracheobronchial tree intact and patent. Mild volume loss within the left lung with associated scattered subsegmental atelectasis within the lingula and left lower lobe. Small area of patchy and nodular densities within the posterior left upper lobe (series 3, images 39, 44), nonspecific, but suspected to reflect a small area of mild infection/pneumonitis given provided history. No other focal infiltrates or consolidative opacity. No pulmonary edema or pleural effusion. No pneumothorax. No other visible pulmonary nodule or mass. Upper Abdomen: Visualized upper abdomen demonstrates no acute finding. 8 mm hyperdensity at the posterior aspect of the visualized left kidney noted,  nonspecific, but likely a small proteinaceous and/or hemorrhagic cyst. Musculoskeletal: Visualized external soft tissues demonstrate no acute finding. No acute osseous finding. No discrete or worrisome osseous lesions. Moderate multilevel thoracic spondylosis noted. IMPRESSION: 1. Small area of patchy and nodular densities within the posterior left upper lobe, nonspecific, but suspected to reflect a small area of mild infection/pneumonitis given provided history. 2. Cardiomegaly with moderate pericardial effusion. 3. Dilatation of the main pulmonary artery up to 3.9 cm, which could reflect underlying pulmonary hypertension. 4. No other acute cardiopulmonary abnormality identified. Electronically Signed   By: Rise Mu M.D.   On: 01/21/2022 02:51   DG Chest Portable 1 View  Result Date: 01/21/2022 CLINICAL DATA:  Congestion and productive cough. EXAM: PORTABLE CHEST 1 VIEW COMPARISON:  September 30, 2018 FINDINGS: Mild, stable, diffusely increased lung markings are seen without evidence of focal consolidation, pleural effusion or pneumothorax. There is stable moderate to marked severity cardiomegaly with prominence of the perihilar pulmonary vasculature. The visualized skeletal structures are unremarkable. IMPRESSION: Stable cardiomegaly with pulmonary vascular congestion. Electronically Signed   By: Aram Candela M.D.   On: 01/21/2022 01:53   ECHOCARDIOGRAM COMPLETE  Result Date: 01/22/2022    ECHOCARDIOGRAM REPORT   Patient Name:   Haley Adams Date of Exam: 01/22/2022 Medical Rec #:  [id number]        Height:       66.0 in Accession #:    [id number]       Weight:       150.0 lb Date of Birth:  1946-10-12        BSA:          1.770 m Patient Age:    75 years         BP:           133/65 mmHg Patient Gender: F                HR:           58 bpm. Exam Location:  [facility] Procedure: 2D Echo, Cardiac Doppler and Color Doppler Indications:     Elevated Troponin  History:         Patient has  prior  history of Echocardiogram examinations, most                  recent 11/12/2016. CHF, Pulmonary HTN, Arrythmias:Atrial                  Fibrillation; Risk Factors:Dyslipidemia. VSD.  Sonographer:     Cristela Blue Referring Phys:  [id number] Alycia Rossetti MATTHEW ORGEL Diagnosing Phys: Sena Slate IMPRESSIONS  1. Large muscular VSD. LVID 6.2 (prior 6.0 in 01/2021). Left ventricular ejection fraction, by estimation, is 50 to 55%. The left ventricle has low normal function. The left ventricle has no regional wall motion abnormalities. The left ventricular internal cavity size was moderately dilated. There is mild left ventricular hypertrophy. Left ventricular diastolic parameters are consistent with Grade II diastolic dysfunction (pseudonormalization).  2. TR jet inadequate to accurately assess RVSP; likely severely elevated. Right ventricular systolic function is mildly reduced. The right ventricular size is moderately enlarged. Mildly increased right ventricular wall thickness.  3. Left atrial size was severely dilated.  4. Right atrial size was severely dilated.  5. Moderate pericardial effusion. The pericardial effusion is circumferential. There is no evidence of cardiac tamponade.  6. The mitral valve is degenerative. Mild mitral valve regurgitation. No evidence of mitral stenosis.  7. The tricuspid valve is degenerative. Tricuspid valve regurgitation is moderate.  8. The aortic valve is normal in structure. Aortic valve regurgitation is not visualized. No aortic stenosis is present.  9. The inferior vena cava is normal in size with greater than 50% respiratory variability, suggesting right atrial pressure of 3 mmHg. FINDINGS  Left Ventricle: Large muscular VSD. LVID 6.2 (prior 6.0 in 01/2021). Left ventricular ejection fraction, by estimation, is 50 to 55%. The left ventricle has low normal function. The left ventricle has no regional wall motion abnormalities. The left ventricular internal cavity size was moderately dilated.  There is mild left ventricular hypertrophy. Left ventricular diastolic parameters are consistent with Grade II diastolic dysfunction (pseudonormalization). Right Ventricle: TR jet inadequate to accurately assess RVSP; likely severely elevated. The right ventricular size is moderately enlarged. Mildly increased right ventricular wall thickness. Right ventricular systolic function is mildly reduced. Left Atrium: Left atrial size was severely dilated. Right Atrium: Right atrial size was severely dilated. Pericardium: A moderately sized pericardial effusion is present. The pericardial effusion is circumferential. There is no evidence of cardiac tamponade. Mitral Valve: The mitral valve is degenerative in appearance. Mild mitral valve regurgitation. No evidence of mitral valve stenosis. MV peak gradient, 3.8 mmHg. The mean mitral valve gradient is 2.0 mmHg. Tricuspid Valve: The tricuspid valve is degenerative in appearance. Tricuspid valve regurgitation is moderate. Aortic Valve: The aortic valve is normal in structure. Aortic valve regurgitation is not visualized. No aortic stenosis is present. Aortic valve mean gradient measures 2.0 mmHg. Aortic valve peak gradient measures 3.6 mmHg. Aortic valve area, by VTI measures 3.08 cm. Pulmonic Valve: The pulmonic valve was grossly normal. Pulmonic valve regurgitation is mild. No evidence of pulmonic stenosis. Aorta: The aortic root and ascending aorta are structurally normal, with no evidence of dilitation. Venous: The inferior vena cava is normal in size with greater than 50% respiratory variability, suggesting right atrial pressure of 3 mmHg. IAS/Shunts: The interatrial septum was not assessed.  LEFT VENTRICLE PLAX 2D LVIDd:         6.20 cm   Diastology LVIDs:         4.00 cm   LV e' medial:    4.46 cm/s LV PW:  0.90 cm   LV E/e' medial:  15.2 LV IVS:        1.30 cm   LV e' lateral:   5.00 cm/s LVOT diam:     2.00 cm   LV E/e' lateral: 13.5 LV SV:         50 LV SV  Index:   28 LVOT Area:     3.14 cm  RIGHT VENTRICLE RV Basal diam:  4.90 cm RV S prime:     11.50 cm/s TAPSE (M-mode): 1.8 cm LEFT ATRIUM             Index        RIGHT ATRIUM           Index LA diam:        5.10 cm 2.88 cm/m   RA Area:     21.90 cm LA Vol (A2C):   85.7 ml 48.43 ml/m  RA Volume:   71.60 ml  40.46 ml/m LA Vol (A4C):   87.3 ml 49.33 ml/m LA Biplane Vol: 90.5 ml 51.14 ml/m  AORTIC VALVE                    PULMONIC VALVE AV Area (Vmax):    3.27 cm     PV Vmax:          0.71 m/s AV Area (Vmean):   2.86 cm     PV Vmean:         50.900 cm/s AV Area (VTI):     3.08 cm     PV VTI:           0.147 m AV Vmax:           94.70 cm/s   PV Peak grad:     2.0 mmHg AV Vmean:          59.000 cm/s  PV Mean grad:     1.0 mmHg AV VTI:            0.162 m      PR End Diast Vel: 8.07 msec AV Peak Grad:      3.6 mmHg     RVOT Peak grad:   5 mmHg AV Mean Grad:      2.0 mmHg LVOT Vmax:         98.60 cm/s LVOT Vmean:        53.700 cm/s LVOT VTI:          0.159 m LVOT/AV VTI ratio: 0.98  AORTA Ao Root diam: 2.60 cm MITRAL VALVE               TRICUSPID VALVE MV Area (PHT): 3.21 cm    TR Peak grad:   55.7 mmHg MV Area VTI:   1.93 cm    TR Vmax:        373.00 cm/s MV Peak grad:  3.8 mmHg MV Mean grad:  2.0 mmHg    SHUNTS MV Vmax:       0.98 m/s    Systemic VTI:  0.16 m MV Vmean:      70.5 cm/s   Systemic Diam: 2.00 cm MV Decel Time: 236 msec    Pulmonic VTI:  0.302 m MV E velocity: 67.70 cm/s MV A velocity: 59.10 cm/s MV E/A ratio:  1.15 Sena Slate Electronically signed by Sena Slate Signature Date/Time: 01/22/2022/10:51:26 AM    Final     ECHO 02/11/2021   MILD LV DYSFUNCTION (See above) WITH MILD LVH  ELEVATED LA PRESSURES WITH DIASTOLIC DYSFUNCTION    MILD RV SYSTOLIC DYSFUNCTION (See above)    VALVULAR REGURGITATION: MILD MR, MILD PR, MODERATE TR    NO VALVULAR STENOSIS    SMALL PERICARDIAL EFFUSION (See above)    SMALL IAFC    LV EF LOWER LIMITS OF NORMAL.    LARGE UNRESTRICTED VSD WITH BIDIRECTIONAL  FLOW, PEAK VELOCITY OF 1.62m/s.    IAFC NOTED BY COLOR AND SPECTRAL DOPPLER.    3D acquisition and reconstructions were performed as part of this    examination to more accurately quantify the effects of identified    structural abnormalities as part of the exam. (post-processing on an    Independent workstation).     Compared with prior Echo study on 12/21/2018: TR AND RVSP INCREASED.    RV AND LV FUNCTION SLIGHTLY DECREASED.    PFO NOTED ON TODAY'S EXAM.   TELEMETRY reviewed by me: Sinus rhythm with rate between 57-91, PVCs  EKG reviewed by me: Sinus rhythm rate 93, anterolateral T wave inversions that appear unchanged prior EKG from 2019.  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  Cardiology is consulted because of her elevated troponin.  #Elevated troponin likely 2/2 demand ischemia #Acute hypoxic respiratory failure #Community-acquired pneumonia #Palpitations Patient presents with 3 days of upper respiratory symptoms of nasal and chest congestion, productive cough, fatigue, chills and has noticed an increased heart rate mostly when coughing.  Her troponins trended [phone] and she denies chest pain. Has been feeling better since she has been in the ED. troponin elevation is likely due to demand ischemia and not ACS. -discontinue azithromycin d/t QTC prolongation while on sotalol.  -weaned to room air during interview and O2 sat at baseline (90-91% per patient reporting) but still with some DOE needing supplemental O2. Baseline 4L at night only.  -Continuous monitoring on telemetry while inpatient, will likely send patient home with live event monitor at discharge for  further monitoring of arrhythmias with her palpitations -No indication for invasive cardiac diagnostics at this time.  #Unrepaired VSD with concomitant pulmonary hypertension (Eisenmenger's) -Continue Ambrisentan 5 mg once daily -Echo resulted with slightly larger LVID at 6.2, (previously 6.0). EF 55-60%, and g2 DD, severe biatrial dilation, moderate circumferential pericardial effusion  #Paroxysmal atrial fibrillation remains in normal rhythm with periods of sinus brady -Continue sotalol 120 mg bid and warfarin. Current INR 2.4.  Appreciate pharmacy assistance with dosing Monitor potassium and magnesium daily. Replete to goal of 2 and 4 respectively Please repeat EKG tomorrow morning.  Avoid QTC prolonging medications.  CHA2DS2-VASc 3  #Hypertension She is on losartan 50 mg BID, HCTZ 12.5 as needed for weight gain at home.  Blood pressure goal at least 130 systolic, per patient  This patient's plan of care was discussed and created with Dr. Sena Slate and he is in agreement.  Signed: Rebeca Allegra , PA-C 01/22/2022, 11:29 AM [facility]

## 2022-01-22 NOTE — Progress Notes (Signed)
Nutrition Brief Note ? ?Patient identified on the Malnutrition Screening Tool (MST) Report ? ?Wt Readings from Last 15 Encounters:  ?01/21/22 68 kg  ?10/04/18 85.1 kg  ?11/13/16 81.1 kg  ?07/20/16 82.6 kg  ? ?Haley Adams is a 76 y.o. female with medical history significant for Unrepaired VSD with concomitant PAH on Ambrisentan followed by pulmonology at [facility] on nighttime O2, combined CHF, A-fib on Coumadin, HTN, migraines, and asthma, who presents to the ED with a 3-day history of cough, congestion and elevated heart rate, especially when coughing, going as high as the 120s.  Cough is productive of blood-tinged yellow phlegm.  She denies chest pain.  Denies fever or chills. ? ?Pt admitted with CAP.  ? ?Reviewed I/O's: +12 ml x 24 hours and +363 ml since admission  ? ?Spoke with pt at bedside, who was pleasant and in good spirits today. She reports very good appetite and consumed "most" of her breakfast this morning. Pt reports she consumes 2 meals per day (meat, starch, and vegetable) and snacks often throughout the day on potato chips, ice cream, and vanilla wafers.  ? ?Pt reports 50# wt loss over the past 1.5 years, which she attributes to depression and dental work. Her appetite has resolved, and has regained some of the weight back.  ?Per CareEverywhere, wt was 148# on 12/27/20. Wt has been stable over the past year.  ? ?Nutrition-Focused physical exam completed. Findings are no fat depletion, mild muscle depletion, and no edema.   ? ?Labs reviewed.  ? ?Current diet order is Heart Healthy, patient is consuming approximately 100% of meals at this time. Labs and medications reviewed.  ? ?No nutrition interventions warranted at this time. If nutrition issues arise, please consult RD.  ? ?Levada Schilling, RD, LDN, CDCES ?Registered Dietitian II ?Certified Diabetes Care and Education Specialist ?Please refer to [facility] for RD and/or RD on-call/weekend/after hours pager   ?

## 2022-01-22 NOTE — Care Management Obs Status (Signed)
MEDICARE OBSERVATION STATUS NOTIFICATION ? ? ?Patient Details  ?Name: Haley Adams ?MRN: [id number] ?Date of Birth: 04/20/1946 ? ? ?Medicare Observation Status Notification Given:  Yes ? ? ? ?Cascade Cellar, RN ?01/22/2022, 1:49 PM ?

## 2022-01-22 NOTE — Consult Note (Addendum)
ANTICOAGULATION CONSULT NOTE - Follow Up Consult ? ?Pharmacy Consult for warfarin ?Indication: atrial fibrillation ? ?Allergies  ?Allergen Reactions  ? Penicillins Other (See Comments)  ?  Has patient had a PCN reaction causing immediate rash, facial/tongue/throat swelling, SOB or lightheadedness with hypotension: no ?Has patient had a PCN reaction causing severe rash involving mucus membranes or skin necrosis: no ?Has patient had a PCN reaction that required hospitalization no ?Has patient had a PCN reaction occurring within the last 10 years: no ?If all of the above answers are "NO", then may proceed with Cephalosporin use. ? ?Fever 105   ? Sulfa Antibiotics   ? Tylenol [Acetaminophen]   ? ? ?Patient Measurements: ?Height: 5\' 6"  (167.6 cm) ?Weight: 68 kg (150 lb) ?IBW/kg (Calculated) : 59.3 ? ?Vital Signs: ?Temp: 98 ?F (36.7 ?C) (03/09 02-23-1980) ?Temp Source: Oral (03/09 0736) ?BP: 133/65 (03/09 0736) ?Pulse Rate: 58 (03/09 0736) ? ?Labs: ?Recent Labs  ?  01/21/22 ?0135 01/21/22 ?0322 01/21/22 ?03/23/22 01/21/22 ?1121 01/22/22 ?03/24/22  ?HGB 14.2  --   --   --  13.5  ?HCT 45.2  --   --   --  43.5  ?PLT 189  --   --   --  169  ?APTT  --  42*  --   --   --   ?LABPROT  --  22.2*  --   --  25.8*  ?INR  --  1.9*  --   --  2.4*  ?CREATININE 0.58  --   --   --  0.60  ?TROPONINIHS 101* 221* 570* 495*  --   ? ? ?Estimated Creatinine Clearance: 57.8 mL/min (by C-G formula based on SCr of 0.6 mg/dL). ? ? ?Medications:  ?Medications Prior to Admission  ?Medication Sig Dispense Refill Last Dose  ? ambrisentan (LETAIRIS) 5 MG tablet Take 5 mg by mouth daily.   01/20/2022 at 2000  ? amLODipine (NORVASC) 2.5 MG tablet Take 2.5 mg by mouth daily.   01/20/2022 at 1000  ? diazepam (VALIUM) 2 MG tablet Take 2 mg by mouth every 6 (six) hours as needed for anxiety.   prn at prn  ? hydrochlorothiazide (MICROZIDE) 12.5 MG capsule Take 12.5 mg by mouth as directed. Take 12.5 mg twice a week as directed by your doctor.   01/18/2022 at 1000  ? losartan  (COZAAR) 50 MG tablet Take 50 mg by mouth 2 (two) times daily.   01/20/2022 at 2000  ? sertraline (ZOLOFT) 25 MG tablet Take 25 mg by mouth daily.   01/20/2022 at 2000  ? sodium chloride (OCEAN) 0.65 % SOLN nasal spray Place 1 spray into both nostrils as needed for congestion. 1 Bottle 0 prn at prn  ? sotalol (BETAPACE) 120 MG tablet Take 120 mg by mouth 2 (two) times daily.   01/20/2022 at 2000  ? traZODone (DESYREL) 50 MG tablet Take 25-50 mg by mouth at bedtime as needed for sleep.   prn at prn  ? warfarin (COUMADIN) 1 MG tablet Take 1-2 mg by mouth as directed. On Tuesday and Thursday take 2 (two) tablets with one 5 mg tablet for a total dose of 7 mg. On all other days take 1 (one) tablet with one 5 mg tablet for a total dose of 6 mg.   01/20/2022 at 2000  ? warfarin (COUMADIN) 5 MG tablet Take 5 mg by mouth daily.   01/20/2022 at 2000  ? aspirin EC 81 MG tablet Take 81 mg by mouth daily. (Patient not  taking: Reported on 01/21/2022)   Not Taking  ? torsemide (DEMADEX) 10 MG tablet Take 10 mg by mouth as needed. (Patient not taking: Reported on 01/21/2022)   Not Taking  ? ?Scheduled:  ? ambrisentan  5 mg Oral Daily  ? amLODipine  2.5 mg Oral Daily  ? losartan  50 mg Oral BID  ? sertraline  25 mg Oral QHS  ? sotalol  120 mg Oral Q12H  ? Warfarin - Pharmacist Dosing Inpatient   Does not apply q1600  ? ? ?Assessment: ?76 year old female on warfarin PTA for Afib presents with cough and shortness of breath. PMH of pulmonary hypertension, HTN, and Afib. CHADSVASc 3, moderate risk for clotting. CT chest shows likely pneumonia, no evidence of acute PE. Pharmacy consulted to manage warfarin. ? ?Home regimen: Warfarin 7mg  Tues/Thurs, 6mg  Mon/Wed/Fri/Sat/Sun. ?HgB13.5, Plt 169 ? ?DDI: ?Azithromycin 3/8 >> (increase INR) ? ?Date INR Warfarin Dose  ?3/8 1.9 Warfarin 7mg   ?3/9 2.4 Warfarin 5mg   ? ? ?Goal of Therapy:  ?INR 2-3 ?Monitor platelets by anticoagulation protocol: Yes ?  ?Plan: INR is therapeutic, patient still receiving  antibiotics. ?Will give warfarin 5mg  x 1 today.  ?Will see the effect of 7mg  warfarin tomorrow. Predict INR to trend up with antibiotic use. ?F/U INR, CBC at least every 3 days per protocol. ? ?5/8, PharmD Candidate ?01/22/2022,9:02 AM ? ?Assessment and plan discussed with me. Agree with current note. ?Amos Micheals Rodriguez-Guzman PharmD, BCPS ?01/23/2022 8:33 AM ? ?

## 2022-01-22 NOTE — Discharge Summary (Signed)
Physician Discharge Summary  Haley Adams ZOX:[id number] DOB: 1946-03-25  PCP: [facility]  Admitted from: Home Discharged to: Home  Admit date: 01/21/2022 Discharge date: 01/22/2022  Recommendations for Outpatient Follow-up:    Follow-up Information     [place], [facility]. Schedule an appointment as soon as possible for a visit in 1 week(s).   Why: To be seen with repeat labs (CBC, BMP, PT and INR).  Kindly reassess home oxygen requirement during this visit.  Follow-up with your Coumadin clinic regarding management of Coumadin with labs. Contact information: [address] [place] [address] [phone]         Marlane Mingle, MD Follow up in 1 week(s).   Specialty: Internal Medicine Why: Patient was seen by the cardiologists while hospitalized.  They recommend arranging a Holter monitor as outpatient. Contact information: [address] [facility] [address] [facility] [place] [address] [place] [address] [phone]                  Home Health: None    Equipment/Devices:     Durable Medical Equipment  (From admission, onward)           Start     Ordered   01/22/22 1308  DME Oxygen  Once       Comments: Oxygen via nasal cannula at 3 L/min continuously during the daytime and 4 L/min continuously at bedtime.  The nighttime oxygen is her prior home dose prior to this admission.  Question Answer Comment  Length of Need 6 Months   Mode or (Route) Nasal cannula   Liters per Minute 3   Frequency Continuous (stationary and portable oxygen unit needed)   Oxygen conserving device Yes   Oxygen delivery system Gas      01/22/22 1311             Discharge Condition: Improved and stable   Code Status: Full Code Diet recommendation:  Discharge Diet Orders (From admission, onward)     Start     Ordered   01/22/22 0000  Diet - low sodium heart healthy        01/22/22 1311             Discharge Diagnoses:  Principal Problem:   CAP  (community acquired pneumonia) Active Problems:   Elevated troponin   Acute respiratory failure with hypoxia (HCC)   Atrial fibrillation (HCC)   HTN (hypertension)   Chronic combined systolic and diastolic CHF (congestive heart failure) (HCC)   Pulmonary hypertension (HCC)   VSD (ventricular septal defect)   Pericardial effusion   Brief Summary: 76 year old female with medical history significant for 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  Cough productive of blood tinged yellowish sputum.  Admitted for suspected community-acquired pneumonia complicated by acute respiratory failure with hypoxia and elevated troponin.  Cardiology consulted.  Assessment and Plan: * CAP (community acquired pneumonia) Rocephin and azithromycin, completed 2 days course. Antitussives, DuoNebs as needed, decongestants as needed Supplemental oxygen to keep sats over 92% Slowly improving. After careful review with pharmacy and the cardiology team, discontinued azithromycin due to concern of contributing to prolonged QTc while on sotalol.  At this time discharging on cefpodoxime (tolerated ceftriaxone) and doxycycline to complete a total 5-day course. Recommend repeating chest  x-ray in 4 weeks to ensure resolution of pneumonia findings.  Acute respiratory failure with hypoxia (HCC) Patient on nighttime 4 L/min O2 only at home, now requiring continuous O2. Tachypneic, conversational dyspnea on admission Reassess home oxygen requirement on day of discharge, now qualifies for home oxygen even during the day. Reassess continued oxygen requirement during close outpatient follow-up with PCP/cardiology.  Elevated troponin Suspect demand ischemia from acute illness Cardiology  consultation appreciated.  Troponin peaked around 600. As per cardiology, suspecting demand ischemia in the setting of pneumonia and underlying cardiac conditions.  Recommend continuing aspirin and the rest of her home meds have been resumed. 2D echo 3/9: Large muscular VSD, LVEF 50-55%.  No LV regional wall motion abnormalities.  Grade 2 diastolic dysfunction.  Moderate pericardial effusion, circumferential with no evidence of cardiac tamponade. Awaiting cardiology signed off.  They recommend close outpatient follow-up with her primary cardiologist and a Holter monitor as outpatient.  Atrial fibrillation (HCC) On Coumadin.  INR therapeutic at 2.4. Continue prior home dose of Coumadin and outpatient follow-up with her Coumadin clinic for further management.  VSD (ventricular septal defect) Unrepaired large muscular VSD followed by [facility]  Pulmonary hypertension (HCC) Continue Ambrisentan  Chronic combined systolic and diastolic CHF (congestive heart failure) (HCC) Compensated.  Continue losartan, sotalol.  Patient uses as needed HCTZ and does not take torsemide. TTE results as above.  HTN (hypertension) Continue losartan, HCTZ which she uses as needed. Controlled.  Pericardial effusion Moderate pericardial effusion on CT. No tamponade clinically or on 2D echo. Cardiology is aware.   Body mass index is 24.21 kg/m.  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  TTE results as above.  Consultations: Cardiology  Procedures: None   Discharge Instructions  Discharge Instructions     (HEART FAILURE PATIENTS) Call MD:  Anytime you have any of the following symptoms: 1) 3 pound weight gain in 24 hours or 5 pounds in 1 week 2) shortness of breath, with or without a dry hacking cough 3) swelling in the hands, feet or stomach 4) if you have to sleep on extra pillows at night in order to breathe.   Complete by: As directed    Call MD  for:  difficulty breathing, headache or visual disturbances   Complete by: As directed    Call MD for:  extreme fatigue   Complete by: As directed    Call MD for:  persistant dizziness or light-headedness   Complete by: As directed    Call MD for:  persistant nausea and vomiting   Complete by: As directed    Call MD for:  severe uncontrolled pain   Complete by: As directed    Call MD for:  temperature >100.4   Complete by: As directed    Diet - low sodium heart healthy   Complete by: As directed    Increase activity slowly   Complete by: As directed         Medication List     STOP taking these medications    torsemide 10 MG tablet Commonly known as: DEMADEX       TAKE these medications    ambrisentan 5 MG tablet Commonly known as: LETAIRIS Take 5 mg by mouth daily.   amLODipine 2.5 MG tablet Commonly known as: NORVASC Take 2.5 mg by mouth daily.   aspirin EC 81 MG tablet Take 81 mg by mouth daily.   cefpodoxime 200 MG tablet Commonly known as: VANTIN Take 1 tablet (200 mg  total) by mouth 2 (two) times daily for 3 days.   diazepam 2 MG tablet Commonly known as: VALIUM Take 2 mg by mouth every 6 (six) hours as needed for anxiety.   doxycycline 100 MG tablet Commonly known as: VIBRA-TABS Take 1 tablet (100 mg total) by mouth 2 (two) times daily for 3 days. Start taking on: January 23, 2022   hydrochlorothiazide 12.5 MG capsule Commonly known as: MICROZIDE Take 12.5 mg by mouth as directed. Take 12.5 mg twice a week as directed by your doctor.   losartan 50 MG tablet Commonly known as: COZAAR Take 50 mg by mouth 2 (two) times daily.   sertraline 25 MG tablet Commonly known as: ZOLOFT Take 25 mg by mouth daily.   sodium chloride 0.65 % Soln nasal spray Commonly known as: OCEAN Place 1 spray into both nostrils as needed for congestion.   sotalol 120 MG tablet Commonly known as: BETAPACE Take 120 mg by mouth 2 (two) times daily.   traZODone 50 MG  tablet Commonly known as: DESYREL Take 25-50 mg by mouth at bedtime as needed for sleep.   warfarin 1 MG tablet Commonly known as: COUMADIN Take 1-2 mg by mouth as directed. On Tuesday and Thursday take 2 (two) tablets with one 5 mg tablet for a total dose of 7 mg. On all other days take 1 (one) tablet with one 5 mg tablet for a total dose of 6 mg.   warfarin 5 MG tablet Commonly known as: COUMADIN Take 5 mg by mouth daily.       Allergies  Allergen Reactions   Penicillins Other (See Comments)    Has patient had a PCN reaction causing immediate rash, facial/tongue/throat swelling, SOB or lightheadedness with hypotension: no Has patient had a PCN reaction causing severe rash involving mucus membranes or skin necrosis: no Has patient had a PCN reaction that required hospitalization no Has patient had a PCN reaction occurring within the last 10 years: no If all of the above answers are "NO", then may proceed with Cephalosporin use.  Fever 105    Sulfa Antibiotics    Tylenol [Acetaminophen]       Procedures/Studies: CT Chest Wo Contrast  Result Date: 01/21/2022 CLINICAL DATA:  Initial evaluation for acute productive cough, congestion, shortness of breath. EXAM: CT CHEST WITHOUT CONTRAST TECHNIQUE: Multidetector CT imaging of the chest was performed following the standard protocol without IV contrast. RADIATION DOSE REDUCTION: This exam was performed according to the departmental dose-optimization program which includes automated exposure control, adjustment of the mA and/or kV according to patient size and/or use of iterative reconstruction technique. COMPARISON:  Prior radiograph from earlier the same day as well as prior chest CT from 02/23/2011. FINDINGS: Cardiovascular: Intrathoracic aorta normal in caliber. Mild atherosclerotic change. Visualized great vessels grossly within normal limits. Prominent cardiomegaly noted. Moderate pericardial effusion measuring simple fluid density  noted. Main pulmonary artery dilated up to 3.9 cm, which could reflect changes of underlying pulmonary hypertension. Mediastinum/Nodes: Visualized thyroid within normal limits. No visible enlarged mediastinal or hilar lymph nodes on this noncontrast examination. No axillary adenopathy. Esophagus within normal limits. Lungs/Pleura: Tracheobronchial tree intact and patent. Mild volume loss within the left lung with associated scattered subsegmental atelectasis within the lingula and left lower lobe. Small area of patchy and nodular densities within the posterior left upper lobe (series 3, images 39, 44), nonspecific, but suspected to reflect a small area of mild infection/pneumonitis given provided history. No other focal infiltrates or consolidative opacity.  No pulmonary edema or pleural effusion. No pneumothorax. No other visible pulmonary nodule or mass. Upper Abdomen: Visualized upper abdomen demonstrates no acute finding. 8 mm hyperdensity at the posterior aspect of the visualized left kidney noted, nonspecific, but likely a small proteinaceous and/or hemorrhagic cyst. Musculoskeletal: Visualized external soft tissues demonstrate no acute finding. No acute osseous finding. No discrete or worrisome osseous lesions. Moderate multilevel thoracic spondylosis noted. IMPRESSION: 1. Small area of patchy and nodular densities within the posterior left upper lobe, nonspecific, but suspected to reflect a small area of mild infection/pneumonitis given provided history. 2. Cardiomegaly with moderate pericardial effusion. 3. Dilatation of the main pulmonary artery up to 3.9 cm, which could reflect underlying pulmonary hypertension. 4. No other acute cardiopulmonary abnormality identified. Electronically Signed   By: Rise Mu M.D.   On: 01/21/2022 02:51   DG Chest Portable 1 View  Result Date: 01/21/2022 CLINICAL DATA:  Congestion and productive cough. EXAM: PORTABLE CHEST 1 VIEW COMPARISON:  September 30, 2018  FINDINGS: Mild, stable, diffusely increased lung markings are seen without evidence of focal consolidation, pleural effusion or pneumothorax. There is stable moderate to marked severity cardiomegaly with prominence of the perihilar pulmonary vasculature. The visualized skeletal structures are unremarkable. IMPRESSION: Stable cardiomegaly with pulmonary vascular congestion. Electronically Signed   By: Aram Candela M.D.   On: 01/21/2022 01:53   ECHOCARDIOGRAM COMPLETE  Result Date: 01/22/2022    ECHOCARDIOGRAM REPORT   Patient Name:   Haley Adams Date of Exam: 01/22/2022 Medical Rec #:  [id number]        Height:       66.0 in Accession #:    [id number]       Weight:       150.0 lb Date of Birth:  Mar 26, 1946        BSA:          1.770 m Patient Age:    75 years         BP:           133/65 mmHg Patient Gender: F                HR:           58 bpm. Exam Location:  [facility] Procedure: 2D Echo, Cardiac Doppler and Color Doppler Indications:     Elevated Troponin  History:         Patient has prior history of Echocardiogram examinations, most                  recent 11/12/2016. CHF, Pulmonary HTN, Arrythmias:Atrial                  Fibrillation; Risk Factors:Dyslipidemia. VSD.  Sonographer:     Cristela Blue Referring Phys:  [id number] Alycia Rossetti MATTHEW ORGEL Diagnosing Phys: Sena Slate IMPRESSIONS  1. Large muscular VSD. LVID 6.2 (prior 6.0 in 01/2021). Left ventricular ejection fraction, by estimation, is 50 to 55%. The left ventricle has low normal function. The left ventricle has no regional wall motion abnormalities. The left ventricular internal cavity size was moderately dilated. There is mild left ventricular hypertrophy. Left ventricular diastolic parameters are consistent with Grade II diastolic dysfunction (pseudonormalization).  2. TR jet inadequate to accurately assess RVSP; likely severely elevated. Right ventricular systolic function is mildly reduced. The right ventricular size is moderately enlarged. Mildly  increased right ventricular wall thickness.  3. Left atrial size was severely dilated.  4. Right atrial size was severely dilated.  5. Moderate pericardial  effusion. The pericardial effusion is circumferential. There is no evidence of cardiac tamponade.  6. The mitral valve is degenerative. Mild mitral valve regurgitation. No evidence of mitral stenosis.  7. The tricuspid valve is degenerative. Tricuspid valve regurgitation is moderate.  8. The aortic valve is normal in structure. Aortic valve regurgitation is not visualized. No aortic stenosis is present.  9. The inferior vena cava is normal in size with greater than 50% respiratory variability, suggesting right atrial pressure of 3 mmHg. FINDINGS  Left Ventricle: Large muscular VSD. LVID 6.2 (prior 6.0 in 01/2021). Left ventricular ejection fraction, by estimation, is 50 to 55%. The left ventricle has low normal function. The left ventricle has no regional wall motion abnormalities. The left ventricular internal cavity size was moderately dilated. There is mild left ventricular hypertrophy. Left ventricular diastolic parameters are consistent with Grade II diastolic dysfunction (pseudonormalization). Right Ventricle: TR jet inadequate to accurately assess RVSP; likely severely elevated. The right ventricular size is moderately enlarged. Mildly increased right ventricular wall thickness. Right ventricular systolic function is mildly reduced. Left Atrium: Left atrial size was severely dilated. Right Atrium: Right atrial size was severely dilated. Pericardium: A moderately sized pericardial effusion is present. The pericardial effusion is circumferential. There is no evidence of cardiac tamponade. Mitral Valve: The mitral valve is degenerative in appearance. Mild mitral valve regurgitation. No evidence of mitral valve stenosis. MV peak gradient, 3.8 mmHg. The mean mitral valve gradient is 2.0 mmHg. Tricuspid Valve: The tricuspid valve is degenerative in appearance.  Tricuspid valve regurgitation is moderate. Aortic Valve: The aortic valve is normal in structure. Aortic valve regurgitation is not visualized. No aortic stenosis is present. Aortic valve mean gradient measures 2.0 mmHg. Aortic valve peak gradient measures 3.6 mmHg. Aortic valve area, by VTI measures 3.08 cm. Pulmonic Valve: The pulmonic valve was grossly normal. Pulmonic valve regurgitation is mild. No evidence of pulmonic stenosis. Aorta: The aortic root and ascending aorta are structurally normal, with no evidence of dilitation. Venous: The inferior vena cava is normal in size with greater than 50% respiratory variability, suggesting right atrial pressure of 3 mmHg. IAS/Shunts: The interatrial septum was not assessed.  LEFT VENTRICLE PLAX 2D LVIDd:         6.20 cm   Diastology LVIDs:         4.00 cm   LV e' medial:    4.46 cm/s LV PW:         0.90 cm   LV E/e' medial:  15.2 LV IVS:        1.30 cm   LV e' lateral:   5.00 cm/s LVOT diam:     2.00 cm   LV E/e' lateral: 13.5 LV SV:         50 LV SV Index:   28 LVOT Area:     3.14 cm  RIGHT VENTRICLE RV Basal diam:  4.90 cm RV S prime:     11.50 cm/s TAPSE (M-mode): 1.8 cm LEFT ATRIUM             Index        RIGHT ATRIUM           Index LA diam:        5.10 cm 2.88 cm/m   RA Area:     21.90 cm LA Vol (A2C):   85.7 ml 48.43 ml/m  RA Volume:   71.60 ml  40.46 ml/m LA Vol (A4C):   87.3 ml 49.33 ml/m LA Biplane Vol: 90.5 ml  51.14 ml/m  AORTIC VALVE                    PULMONIC VALVE AV Area (Vmax):    3.27 cm     PV Vmax:          0.71 m/s AV Area (Vmean):   2.86 cm     PV Vmean:         50.900 cm/s AV Area (VTI):     3.08 cm     PV VTI:           0.147 m AV Vmax:           94.70 cm/s   PV Peak grad:     2.0 mmHg AV Vmean:          59.000 cm/s  PV Mean grad:     1.0 mmHg AV VTI:            0.162 m      PR End Diast Vel: 8.07 msec AV Peak Grad:      3.6 mmHg     RVOT Peak grad:   5 mmHg AV Mean Grad:      2.0 mmHg LVOT Vmax:         98.60 cm/s LVOT Vmean:         53.700 cm/s LVOT VTI:          0.159 m LVOT/AV VTI ratio: 0.98  AORTA Ao Root diam: 2.60 cm MITRAL VALVE               TRICUSPID VALVE MV Area (PHT): 3.21 cm    TR Peak grad:   55.7 mmHg MV Area VTI:   1.93 cm    TR Vmax:        373.00 cm/s MV Peak grad:  3.8 mmHg MV Mean grad:  2.0 mmHg    SHUNTS MV Vmax:       0.98 m/s    Systemic VTI:  0.16 m MV Vmean:      70.5 cm/s   Systemic Diam: 2.00 cm MV Decel Time: 236 msec    Pulmonic VTI:  0.302 m MV E velocity: 67.70 cm/s MV A velocity: 59.10 cm/s MV E/A ratio:  1.15 Sena Slate Electronically signed by Sena Slate Signature Date/Time: 01/22/2022/10:51:26 AM    Final       Subjective: Overall feels better, over 50% improved.  Cough is slightly more but nonproductive.  Dyspnea including dyspnea on exertion has significantly improved.  No chest pain, palpitations, dizziness or lightheadedness.  Discharge Exam:  Vitals:   01/21/22 2325 01/22/22 0444 01/22/22 0736 01/22/22 1143  BP: 126/67 140/76 133/65 (!) 135/59  Pulse: 61 (!) 55 (!) 58 63  Resp: 17 19 18 17   Temp: 98.5 F (36.9 C) (!) 97.5 F (36.4 C) 98 F (36.7 C) 98.5 F (36.9 C)  TempSrc:   Oral   SpO2: 94% 96% 94% 92%  Weight:      Height:        General exam: Elderly female, moderately built and nourished lying comfortably propped up in bed without distress. Respiratory system: Left basal crackles but otherwise clear to auscultation. Respiratory effort normal. Cardiovascular system: S1 & S2 heard, RRR. No JVD, murmurs, rubs, gallops or clicks. No pedal edema.  Telemetry personally reviewed: SB in the 50s-SR Gastrointestinal system: Abdomen is nondistended, soft and nontender. No organomegaly or masses felt. Normal bowel sounds heard. Central nervous system: Alert and oriented. No focal neurological deficits.  Extremities: Symmetric 5 x 5 power. Skin: No rashes, lesions or ulcers Psychiatry: Judgement and insight appear normal. Mood & affect appropriate.     The results of  significant diagnostics from this hospitalization (including imaging, microbiology, ancillary and laboratory) are listed below for reference.     Microbiology: Recent Results (from the past 240 hour(s))  Resp Panel by RT-PCR (Flu A&B, Covid) Nasopharyngeal Swab     Status: None   Collection Time: 01/21/22  1:35 AM   Specimen: Nasopharyngeal Swab; Nasopharyngeal(NP) swabs in vial transport medium  Result Value Ref Range Status   SARS Coronavirus 2 by RT PCR NEGATIVE NEGATIVE Final    Comment: (NOTE) SARS-CoV-2 target nucleic acids are NOT DETECTED.  The SARS-CoV-2 RNA is generally detectable in upper respiratory specimens during the acute phase of infection. The lowest concentration of SARS-CoV-2 viral copies this assay can detect is 138 copies/mL. A negative result does not preclude SARS-Cov-2 infection and should not be used as the sole basis for treatment or other patient management decisions. A negative result may occur with  improper specimen collection/handling, submission of specimen other than nasopharyngeal swab, presence of viral mutation(s) within the areas targeted by this assay, and inadequate number of viral copies(<138 copies/mL). A negative result must be combined with clinical observations, patient history, and epidemiological information. The expected result is Negative.  Fact Sheet for Patients:  BloggerCourse.com  Fact Sheet for Healthcare Providers:  SeriousBroker.it  This test is no t yet approved or cleared by the [place] FDA and  has been authorized for detection and/or diagnosis of SARS-CoV-2 by FDA under an Emergency Use Authorization (EUA). This EUA will remain  in effect (meaning this test can be used) for the duration of the COVID-19 declaration under Section 564(b)(1) of the Act, 21 U.S.C.section 360bbb-3(b)(1), unless the authorization is terminated  or revoked sooner.       Influenza A by  PCR NEGATIVE NEGATIVE Final   Influenza B by PCR NEGATIVE NEGATIVE Final    Comment: (NOTE) The Xpert Xpress SARS-CoV-2/FLU/RSV plus assay is intended as an aid in the diagnosis of influenza from Nasopharyngeal swab specimens and should not be used as a sole basis for treatment. Nasal washings and aspirates are unacceptable for Xpert Xpress SARS-CoV-2/FLU/RSV testing.  Fact Sheet for Patients: BloggerCourse.com  Fact Sheet for Healthcare Providers: SeriousBroker.it  This test is not yet approved or cleared by the [place] FDA and has been authorized for detection and/or diagnosis of SARS-CoV-2 by FDA under an Emergency Use Authorization (EUA). This EUA will remain in effect (meaning this test can be used) for the duration of the COVID-19 declaration under Section 564(b)(1) of the Act, 21 U.S.C. section 360bbb-3(b)(1), unless the authorization is terminated or revoked.  Performed at [facility], [address] [place], [place] [address]      Labs: CBC: Recent Labs  Lab 01/21/22 0135 01/22/22 0539  WBC 7.9 6.3  NEUTROABS 5.2  --   HGB 14.2 13.5  HCT 45.2 43.5  MCV 90.4 91.4  PLT 189 169    Basic Metabolic Panel: Recent Labs  Lab 01/21/22 0135 01/21/22 0819 01/22/22 0539  NA 138  --  138  K 3.9  --  4.1  CL 106  --  108  CO2 22  --  23  GLUCOSE 118*  --  87  BUN 15  --  15  CREATININE 0.58  --  0.60  CALCIUM 8.9  --  8.8*  MG  --  1.9 2.0    Liver Function Tests: Recent Labs  Lab 01/21/22 0135  AST 22  ALT 18  ALKPHOS 54  BILITOT 0.6  PROT 7.2  ALBUMIN 4.0     Urinalysis    Component Value Date/Time   COLORURINE STRAW (A) 01/21/2022 0323   APPEARANCEUR CLEAR (A) 01/21/2022 0323   APPEARANCEUR Clear 02/02/2013 1433   LABSPEC 1.006 01/21/2022 0323   LABSPEC 1.006 02/02/2013 1433   PHURINE 6.0 01/21/2022 0323   GLUCOSEU NEGATIVE 01/21/2022 0323   GLUCOSEU Negative 02/02/2013  1433   HGBUR NEGATIVE 01/21/2022 0323   BILIRUBINUR NEGATIVE 01/21/2022 0323   BILIRUBINUR Negative 02/02/2013 1433   KETONESUR NEGATIVE 01/21/2022 0323   PROTEINUR 30 (A) 01/21/2022 0323   NITRITE NEGATIVE 01/21/2022 0323   LEUKOCYTESUR MODERATE (A) 01/21/2022 0323   LEUKOCYTESUR Negative 02/02/2013 1433   Attempted to reach spouse via phone number that is listed but rang without answer.   Time coordinating discharge: 40 minutes  SIGNED:  Marcellus Scott, MD,  FACP, [facility], [facility], [facility] (Care Management Physician Certified). [facility]  To contact the attending provider between 7A-7P or the covering provider during after hours 7P-7A, please log into the web site [email or web address] and access using universal [place] password for that web site. If you do not have the password, please call the hospital operator.

## 2022-01-22 NOTE — Discharge Instructions (Signed)

## 2022-01-22 NOTE — Progress Notes (Signed)
*  PRELIMINARY RESULTS* ?Echocardiogram ?2D Echocardiogram has been performed. ? ?Haley Adams, Dorene Sorrow ?01/22/2022, 8:33 AM ?

## 2022-01-22 NOTE — Progress Notes (Signed)
Addendum ? ?Patient was supposed to DC today.  However upon cardiology follow-up, due to being on sotalol and azithromycin, EKG was repeated and showed prolonged QTc.  Although a.m. labs had potassium of 4 and magnesium of 2, subsequent labs showed potassium of 3.7 and magnesium of 1.9.  Discharge was canceled.  Azithromycin discontinued.  Replacing potassium to keep >4 and magnesium to keep >2.  Follow EKG in a.m. and if QTc has normalized, could be discharged tomorrow. ? ?Marcellus Scott, MD,  ?FACP, [facility], [facility], [facility] (Care Management Physician Certified) ?[facility] Ad[facility] ?[place] ? ?To contact the attending provider between 7A-7P or the covering provider during after hours 7P-7A, please log into the web site [email or web address] and access using universal [place] password for that web site. If you do not have the password, please call the hospital operator. ? ?

## 2022-01-22 NOTE — Progress Notes (Signed)
Cross Cover ?Patietn admitted less than 48 hours for CAP.  Nurse reports 4 watery bowel movements since admit and history of c. Diff. Family requesting testing. C. Diff testing ordered ?

## 2022-01-23 DIAGNOSIS — R197 Diarrhea, unspecified: Secondary | ICD-10-CM

## 2022-01-23 DIAGNOSIS — R1310 Dysphagia, unspecified: Secondary | ICD-10-CM

## 2022-01-23 DIAGNOSIS — J189 Pneumonia, unspecified organism: Secondary | ICD-10-CM | POA: Diagnosis not present

## 2022-01-23 LAB — POTASSIUM: Potassium: 4.5 mmol/L (ref 3.5–5.1)

## 2022-01-23 LAB — PROTIME-INR
INR: 2.5 — ABNORMAL HIGH (ref 0.8–1.2)
Prothrombin Time: 26.9 seconds — ABNORMAL HIGH (ref 11.4–15.2)

## 2022-01-23 LAB — C DIFFICILE QUICK SCREEN W PCR REFLEX
C Diff antigen: NEGATIVE
C Diff interpretation: NOT DETECTED
C Diff toxin: NEGATIVE

## 2022-01-23 LAB — TROPONIN I (HIGH SENSITIVITY): Troponin I (High Sensitivity): 78 ng/L — ABNORMAL HIGH (ref <18)

## 2022-01-23 LAB — MAGNESIUM: Magnesium: 2.1 mg/dL (ref 1.7–2.4)

## 2022-01-23 MED ORDER — WARFARIN SODIUM 6 MG PO TABS
6.0000 mg | ORAL_TABLET | Freq: Once | ORAL | Status: AC
  Administered 2022-01-23: 6 mg via ORAL
  Filled 2022-01-23: qty 1

## 2022-01-23 MED ORDER — CALCIUM CARBONATE ANTACID 500 MG PO CHEW
400.0000 mg | CHEWABLE_TABLET | Freq: Four times a day (QID) | ORAL | Status: DC | PRN
  Administered 2022-01-23: 400 mg via ORAL
  Filled 2022-01-23: qty 2

## 2022-01-23 MED ORDER — ALUM & MAG HYDROXIDE-SIMETH 200-200-20 MG/5ML PO SUSP
30.0000 mL | ORAL | Status: DC | PRN
  Administered 2022-01-23: 30 mL via ORAL
  Filled 2022-01-23: qty 30

## 2022-01-23 MED ORDER — WARFARIN SODIUM 6 MG PO TABS: 6.0000 mg | ORAL_TABLET | Freq: Once | ORAL | Status: DC

## 2022-01-23 MED ORDER — DIAZEPAM 2 MG PO TABS: 2.0000 mg | ORAL_TABLET | Freq: Four times a day (QID) | ORAL | Status: DC | PRN

## 2022-01-23 MED ORDER — PANTOPRAZOLE SODIUM 40 MG PO TBEC
40.0000 mg | DELAYED_RELEASE_TABLET | Freq: Every day | ORAL | Status: DC
  Administered 2022-01-23 – 2022-01-24 (×2): 40 mg via ORAL
  Filled 2022-01-23 (×2): qty 1

## 2022-01-23 NOTE — Assessment & Plan Note (Addendum)
Reported on 3/10 with pain radiating to the back. ?Does have history of GERD. ?Continue PPI, as needed Maalox or Tums, discontinued doxycycline. ?Improved and almost resolved.  Continue PPI at discharge.  ?Patient reports that she was on this before and stopped it well until about 4 years ago. ?

## 2022-01-23 NOTE — Progress Notes (Signed)
PROGRESS NOTE   Haley Adams  ENM:[id number]    DOB: 23-Apr-1946    DOA: 01/21/2022  PCP: [facility]   I have briefly reviewed patients previous medical records in [facility].  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  Cough productive of blood tinged yellowish sputum.  Admitted for suspected community-acquired pneumonia complicated by acute respiratory failure with hypoxia and elevated troponin.  Cardiology consulted.  Patient was about to DC on 3/9 but noted EKG with prolonged QTc and discharge was canceled.  Azithromycin discontinued due to possible interaction with sotalol.  EKG 3/10 with improved QTc, in the 470 ms range.  Patient however with some?  Dysphagia/pain on swallowing, back pain.   Assessment & Plan:  Principal Problem:   CAP (community acquired pneumonia) Active Problems:   Elevated troponin   Acute respiratory failure with hypoxia (HCC)   Atrial fibrillation (HCC)   HTN (hypertension)   Chronic combined systolic and diastolic CHF (congestive heart failure) (HCC)   Pulmonary hypertension (HCC)   VSD (ventricular septal defect)   Pericardial effusion   Diarrhea   Painful swallowing   Assessment and Plan: * CAP (community acquired pneumonia) Rocephin and azithromycin, completed 2 days course. Antitussives, DuoNebs as needed, decongestants as needed Supplemental oxygen to keep sats over 92% Slowly improving. After careful review with pharmacy and the cardiology team, discontinued azithromycin due to concern of contributing to prolonged QTc while on sotalol.  QTc was  greater than 500 ms on 3/9. Continue IV ceftriaxone.  Azithromycin has been changed to doxycycline but patient having upper GI symptoms today which could be precipitated with doxycycline and hence this was discontinued. At time of discharge, if has not completed course, would discharge on cefpodoxime. Recommend repeating chest x-ray in 4 weeks to ensure resolution of pneumonia findings.  Acute respiratory failure with hypoxia (HCC) Patient on nighttime 4 L/min O2 only at home, now requiring continuous O2. Tachypneic, conversational dyspnea on admission Now on 3 L/min in the daytime as well. Reassess home oxygen requirement again prior to DC.  Elevated troponin Suspect demand ischemia from acute illness Cardiology consultation appreciated.  Troponin peaked around 600. As per cardiology, suspecting demand ischemia in the setting of pneumonia and underlying cardiac conditions.  Recommend continuing aspirin and the rest of her home meds have been resumed. 2D echo 3/9: Large muscular VSD, LVEF 50-55%.  No LV regional wall motion abnormalities.  Grade 2 diastolic dysfunction.  Moderate pericardial effusion, circumferential with no evidence of cardiac tamponade. Discussed with cardiology.  Troponins down to 78 but EKG with new T wave inversions and ST depressions in anterior lateral leads, cardiology concerned and plan to review her case with her primary cardiologist.  No chest pain.  Atrial fibrillation (HCC) On Coumadin.  INR therapeutic at 2.5. Continue prior home dose of Coumadin and outpatient follow-up with her Coumadin clinic for further management.  VSD (ventricular septal defect) Unrepaired large muscular VSD followed by [facility]  Pulmonary hypertension (HCC) Continue Ambrisentan  Chronic combined systolic and diastolic CHF (congestive heart failure) (HCC) Compensated.  Continue losartan, sotalol.  Patient uses as needed  HCTZ and does not take torsemide. TTE results as above.  HTN  (hypertension) Continue losartan, HCTZ which she uses as needed. Controlled.  Painful swallowing Reported on 3/10 with pain radiating to the back. Does have history of GERD. Continue PPI, as needed Maalox or Tums, discontinue doxycycline. Was discussed with patient and daughter at bedside, if continues to have symptoms, could consider barium swallow or SLP evaluation.  Diarrhea Had 4-5 episodes of diarrhea on 3/9, better today and only 1 episode of loose stools. Prior history of C. difficile  C. difficile testing negative on 3/8. May be related to antibiotics.  Monitor.  Pericardial effusion Moderate pericardial effusion on CT. No tamponade clinically or on 2D echo. Cardiology is aware.    Body mass index is 24.21 kg/m.  Nutritional Status        Pressure Ulcer:     DVT prophylaxis:   Coumadin anticoagulation   Code Status: Full Code:  Family Communication: Daughter at bedside Disposition:  Status is: Inpatient Remains inpatient appropriate because: Severity of illness and IV antibiotics    Consultants:   Cardiology  Procedures:     Antimicrobials:   As above   Subjective:  Had some diarrhea yesterday, better today.  Had 1 episode of loose stools this morning.  No abdominal pain.  Reports?  Pain or discomfort on swallowing but no sensation of food being stuck.  Some back pain.  No chest pain.  Dyspnea much improved.  Objective:   Vitals:   01/23/22 0345 01/23/22 0807 01/23/22 1139 01/23/22 1643  BP: 138/77 (!) 143/85 (!) 127/59 132/75  Pulse: (!) 56 71 63 71  Resp: Temp: (!) 97.5 F (36.4 C) 98.2 F (36.8 C) 98.7 F (37.1 C) 99.2 F (37.3 C)  TempSrc:  Oral    SpO2: 95% 96% 94% 96%  Weight:      Height:        General exam: Elderly female, moderately built and nourished lying comfortably propped up in bed without distress. Respiratory system: Occasional left basal crackles but otherwise clear to auscultation. Cardiovascular  system: S1 & S2 heard, RRR. No JVD, murmurs, rubs, gallops or clicks. No pedal edema.  Telemetry personally reviewed: SB in the 50s-SR. Gastrointestinal system: Abdomen is nondistended, soft and nontender.  No organomegaly or masses appreciated.  Normal bowel sounds heard. Central nervous system: Alert and oriented. No focal neurological deficits. Extremities: Symmetric 5 x 5 power. Skin: No rashes, lesions or ulcers Psychiatry: Judgement and insight appear normal. Mood & affect appropriate.     Data Reviewed:   I have personally reviewed following labs and imaging studies   CBC: Recent Labs  Lab 01/21/22 0135 01/22/22 0539  WBC 7.9 6.3  NEUTROABS 5.2  --   HGB 14.2 13.5  HCT 45.2 43.5  MCV 90.4 91.4  PLT 189 169    Basic Metabolic Panel: Recent Labs  Lab 01/21/22 0135 01/21/22 0819 01/22/22 0539 01/22/22 1358 01/23/22 0601  NA 138  --  138  --   --   K 3.9  --  4.1 3.7 4.5  CL 106  --  108  --   --   CO2 22  --  23  --   --   GLUCOSE 118*  --  87  --   --   BUN 15  --  15  --   --   CREATININE 0.58  --  0.60  --   --   CALCIUM 8.9  --  8.8*  --   --   MG  --  1.9 2.0 1.9 2.1    Liver Function Tests: Recent Labs  Lab 01/21/22 0135 01/22/22 1358  AST 22 19  ALT 18 13  ALKPHOS 54 47  BILITOT 0.6 0.6  PROT 7.2 6.3*  ALBUMIN 4.0 3.6    CBG: No results for input(s): GLUCAP in the last 168 hours.  Microbiology Studies:   Recent Results (from the past 240 hour(s))  Resp Panel by RT-PCR (Flu A&B, Covid) Nasopharyngeal Swab     Status: None   Collection Time: 01/21/22  1:35 AM   Specimen: Nasopharyngeal Swab; Nasopharyngeal(NP) swabs in vial transport medium  Result Value Ref Range Status   SARS Coronavirus 2 by RT PCR NEGATIVE NEGATIVE Final    Comment: (NOTE) SARS-CoV-2 target nucleic acids are NOT DETECTED.  The SARS-CoV-2 RNA is generally detectable in upper respiratory specimens during the acute phase of infection. The lowest concentration of  SARS-CoV-2 viral copies this assay can detect is 138 copies/mL. A negative result does not preclude SARS-Cov-2 infection and should not be used as the sole basis for treatment or other patient management decisions. A negative result may occur with  improper specimen collection/handling, submission of specimen other than nasopharyngeal swab, presence of viral mutation(s) within the areas targeted by this assay, and inadequate number of viral copies(<138 copies/mL). A negative result must be combined with clinical observations, patient history, and epidemiological information. The expected result is Negative.  Fact Sheet for Patients:  BloggerCourse.comhttps://www.fda.gov/media/152166/download  Fact Sheet for Healthcare Providers:  SeriousBroker.ithttps://www.fda.gov/media/152162/download  This test is no t yet approved or cleared by the [place] FDA and  has been authorized for detection and/or diagnosis of SARS-CoV-2 by FDA under an Emergency Use Authorization (EUA). This EUA will remain  in effect (meaning this test can be used) for the duration of the COVID-19 declaration under Section 564(b)(1) of the Act, 21 U.S.C.section 360bbb-3(b)(1), unless the authorization is terminated  or revoked sooner.       Influenza A by PCR NEGATIVE NEGATIVE Final   Influenza B by PCR NEGATIVE NEGATIVE Final    Comment: (NOTE) The Xpert Xpress SARS-CoV-2/FLU/RSV plus assay is intended as an aid in the diagnosis of influenza from Nasopharyngeal swab specimens and should not be used as a sole basis for treatment. Nasal washings and aspirates are unacceptable for Xpert Xpress SARS-CoV-2/FLU/RSV testing.  Fact Sheet for Patients: BloggerCourse.comhttps://www.fda.gov/media/152166/download  Fact Sheet for Healthcare Providers: SeriousBroker.ithttps://www.fda.gov/media/152162/download  This test is not yet approved or cleared by the [place] FDA and has been authorized for detection and/or diagnosis of SARS-CoV-2 by FDA under an Emergency Use  Authorization (EUA). This EUA will remain in effect (meaning this test can be used) for the duration of the COVID-19 declaration under Section 564(b)(1) of the Act, 21 U.S.C. section 360bbb-3(b)(1), unless the authorization is terminated or revoked.  Performed at [facility], [address] [place], [place] [address]   C Difficile Quick Screen w PCR reflex     Status: None   Collection Time: 01/22/22 10:37 PM   Specimen: STOOL  Result Value Ref Range Status   C Diff antigen NEGATIVE NEGATIVE Final   C Diff toxin NEGATIVE NEGATIVE Final   C Diff interpretation No C. difficile detected.  Final    Comment: Performed at [facility], [address] [place], [place] [address]    Radiology Studies:  ECHOCARDIOGRAM COMPLETE  Result Date: 01/22/2022    ECHOCARDIOGRAM REPORT   Patient  Name:   Imonie Munyan Date of Exam: 01/22/2022 Medical Rec #:  [id number]        Height:       66.0 in Accession #:    [id number]       Weight:       150.0 lb Date of Birth:  [birth year]        BSA:          1.770 m Patient Age:    75 years         BP:           133/65 mmHg Patient Gender: F                HR:           58 bpm. Exam Location:  [facility] Procedure: 2D Echo, Cardiac Doppler and Color Doppler Indications:     Elevated Troponin  History:         Patient has prior history of Echocardiogram examinations, most                  recent 11/12/2016. CHF, Pulmonary HTN, Arrythmias:Atrial                  Fibrillation; Risk Factors:Dyslipidemia. VSD.  Sonographer:     Cristela Blue Referring Phys:  [id number] Alycia Rossetti MATTHEW ORGEL Diagnosing Phys: Sena Slate IMPRESSIONS  1. Large muscular VSD. LVID 6.2 (prior 6.0 in 01/2021). Left ventricular ejection fraction, by estimation, is 50 to 55%. The left ventricle has low normal function. The left ventricle has no regional wall motion abnormalities. The left ventricular internal cavity size was moderately dilated. There is mild left ventricular hypertrophy. Left  ventricular diastolic parameters are consistent with Grade II diastolic dysfunction (pseudonormalization).  2. TR jet inadequate to accurately assess RVSP; likely severely elevated. Right ventricular systolic function is mildly reduced. The right ventricular size is moderately enlarged. Mildly increased right ventricular wall thickness.  3. Left atrial size was severely dilated.  4. Right atrial size was severely dilated.  5. Moderate pericardial effusion. The pericardial effusion is circumferential. There is no evidence of cardiac tamponade.  6. The mitral valve is degenerative. Mild mitral valve regurgitation. No evidence of mitral stenosis.  7. The tricuspid valve is degenerative. Tricuspid valve regurgitation is moderate.  8. The aortic valve is normal in structure. Aortic valve regurgitation is not visualized. No aortic stenosis is present.  9. The inferior vena cava is normal in size with greater than 50% respiratory variability, suggesting right atrial pressure of 3 mmHg. FINDINGS  Left Ventricle: Large muscular VSD. LVID 6.2 (prior 6.0 in 01/2021). Left ventricular ejection fraction, by estimation, is 50 to 55%. The left ventricle has low normal function. The left ventricle has no regional wall motion abnormalities. The left ventricular internal cavity size was moderately dilated. There is mild left ventricular hypertrophy. Left ventricular diastolic parameters are consistent with Grade II diastolic dysfunction (pseudonormalization). Right Ventricle: TR jet inadequate to accurately assess RVSP; likely severely elevated. The right ventricular size is moderately enlarged. Mildly increased right ventricular wall thickness. Right ventricular systolic function is mildly reduced. Left Atrium: Left atrial size was severely dilated. Right Atrium: Right atrial size was severely dilated. Pericardium: A moderately sized pericardial effusion is present. The pericardial effusion is circumferential. There is no evidence of  cardiac tamponade. Mitral Valve: The mitral valve is degenerative in appearance. Mild mitral valve regurgitation. No evidence of mitral valve stenosis. MV peak gradient, 3.8 mmHg. The mean mitral valve gradient  is 2.0 mmHg. Tricuspid Valve: The tricuspid valve is degenerative in appearance. Tricuspid valve regurgitation is moderate. Aortic Valve: The aortic valve is normal in structure. Aortic valve regurgitation is not visualized. No aortic stenosis is present. Aortic valve mean gradient measures 2.0 mmHg. Aortic valve peak gradient measures 3.6 mmHg. Aortic valve area, by VTI measures 3.08 cm. Pulmonic Valve: The pulmonic valve was grossly normal. Pulmonic valve regurgitation is mild. No evidence of pulmonic stenosis. Aorta: The aortic root and ascending aorta are structurally normal, with no evidence of dilitation. Venous: The inferior vena cava is normal in size with greater than 50% respiratory variability, suggesting right atrial pressure of 3 mmHg. IAS/Shunts: The interatrial septum was not assessed.  LEFT VENTRICLE PLAX 2D LVIDd:         6.20 cm   Diastology LVIDs:         4.00 cm   LV e' medial:    4.46 cm/s LV PW:         0.90 cm   LV E/e' medial:  15.2 LV IVS:        1.30 cm   LV e' lateral:   5.00 cm/s LVOT diam:     2.00 cm   LV E/e' lateral: 13.5 LV SV:         50 LV SV Index:   28 LVOT Area:     3.14 cm  RIGHT VENTRICLE RV Basal diam:  4.90 cm RV S prime:     11.50 cm/s TAPSE (M-mode): 1.8 cm LEFT ATRIUM             Index        RIGHT ATRIUM           Index LA diam:        5.10 cm 2.88 cm/m   RA Area:     21.90 cm LA Vol (A2C):   85.7 ml 48.43 ml/m  RA Volume:   71.60 ml  40.46 ml/m LA Vol (A4C):   87.3 ml 49.33 ml/m LA Biplane Vol: 90.5 ml 51.14 ml/m  AORTIC VALVE                    PULMONIC VALVE AV Area (Vmax):    3.27 cm     PV Vmax:          0.71 m/s AV Area (Vmean):   2.86 cm     PV Vmean:         50.900 cm/s AV Area (VTI):     3.08 cm     PV VTI:           0.147 m AV Vmax:            94.70 cm/s   PV Peak grad:     2.0 mmHg AV Vmean:          59.000 cm/s  PV Mean grad:     1.0 mmHg AV VTI:            0.162 m      PR End Diast Vel: 8.07 msec AV Peak Grad:      3.6 mmHg     RVOT Peak grad:   5 mmHg AV Mean Grad:      2.0 mmHg LVOT Vmax:         98.60 cm/s LVOT Vmean:        53.700 cm/s LVOT VTI:          0.159 m LVOT/AV VTI ratio: 0.98  AORTA Ao Root diam: 2.60 cm MITRAL VALVE               TRICUSPID VALVE MV Area (PHT): 3.21 cm    TR Peak grad:   55.7 mmHg MV Area VTI:   1.93 cm    TR Vmax:        373.00 cm/s MV Peak grad:  3.8 mmHg MV Mean grad:  2.0 mmHg    SHUNTS MV Vmax:       0.98 m/s    Systemic VTI:  0.16 m MV Vmean:      70.5 cm/s   Systemic Diam: 2.00 cm MV Decel Time: 236 msec    Pulmonic VTI:  0.302 m MV E velocity: 67.70 cm/s MV A velocity: 59.10 cm/s MV E/A ratio:  1.15 Sena Slate Electronically signed by Sena Slate Signature Date/Time: 01/22/2022/10:51:26 AM    Final     Scheduled Meds:    ambrisentan  5 mg Oral Daily   amLODipine  2.5 mg Oral Daily   losartan  50 mg Oral BID   pantoprazole  40 mg Oral Daily   sertraline  25 mg Oral QHS   sotalol  120 mg Oral Q12H   warfarin  6 mg Oral ONCE-1600   Warfarin - Pharmacist Dosing Inpatient   Does not apply q1600    Continuous Infusions:    sodium chloride 10 mL/hr at 01/23/22 1512   cefTRIAXone (ROCEPHIN)  IV Stopped (01/23/22 0737)     LOS: 1 day     Marcellus Scott, MD,  FACP, [facility], [facility], [facility] (Care Management Physician Certified) [facility] Ad[facility] [place]  To contact the attending provider between 7A-7P or the covering provider during after hours 7P-7A, please log into the web site [email or web address] and access using universal [place] password for that web site. If you do not have the password, please call the hospital operator.  01/23/2022, 5:16 PM

## 2022-01-23 NOTE — Assessment & Plan Note (Addendum)
Had 4-5 episodes of diarrhea on 3/9 ?Prior history of C. difficile  ?C. difficile testing negative on 3/8. ?May be related to antibiotics.  Resolved. ?

## 2022-01-23 NOTE — Consult Note (Signed)
ANTICOAGULATION CONSULT NOTE - Follow Up Consult ? ?Pharmacy Consult for warfarin ?Indication: atrial fibrillation ? ?Allergies  ?Allergen Reactions  ? Penicillins Other (See Comments)  ?  Has patient had a PCN reaction causing immediate rash, facial/tongue/throat swelling, SOB or lightheadedness with hypotension: no ?Has patient had a PCN reaction causing severe rash involving mucus membranes or skin necrosis: no ?Has patient had a PCN reaction that required hospitalization no ?Has patient had a PCN reaction occurring within the last 10 years: no ?If all of the above answers are "NO", then may proceed with Cephalosporin use. ? ?Fever 105   ? Sulfa Antibiotics   ? Tylenol [Acetaminophen]   ? ? ?Patient Measurements: ?Height: 5\' 6"  (167.6 cm) ?Weight: 68 kg (150 lb) ?IBW/kg (Calculated) : 59.3 ? ?Vital Signs: ?Temp: 97.5 ?F (36.4 ?C) (03/10 0345) ?Temp Source: Oral (03/10 0102) ?BP: 138/77 (03/10 0345) ?Pulse Rate: 56 (03/10 0345) ? ?Labs: ?Recent Labs  ?  01/21/22 ?0135 01/21/22 ?0322 01/21/22 ?03/23/22 01/21/22 ?1121 01/22/22 ?03/24/22 01/23/22 ?0601  ?HGB 14.2  --   --   --  13.5  --   ?HCT 45.2  --   --   --  43.5  --   ?PLT 189  --   --   --  169  --   ?APTT  --  42*  --   --   --   --   ?LABPROT  --  22.2*  --   --  25.8* 26.9*  ?INR  --  1.9*  --   --  2.4* 2.5*  ?CREATININE 0.58  --   --   --  0.60  --   ?TROPONINIHS 101* 221* 570* 495*  --   --   ? ? ? ?Estimated Creatinine Clearance: 57.8 mL/min (by C-G formula based on SCr of 0.6 mg/dL). ? ? ?Medications:  ?Medications Prior to Admission  ?Medication Sig Dispense Refill Last Dose  ? ambrisentan (LETAIRIS) 5 MG tablet Take 5 mg by mouth daily.   01/20/2022 at 2000  ? amLODipine (NORVASC) 2.5 MG tablet Take 2.5 mg by mouth daily.   01/20/2022 at 1000  ? diazepam (VALIUM) 2 MG tablet Take 2 mg by mouth every 6 (six) hours as needed for anxiety.   prn at prn  ? hydrochlorothiazide (MICROZIDE) 12.5 MG capsule Take 12.5 mg by mouth as directed. Take 12.5 mg twice a week as  directed by your doctor.   01/18/2022 at 1000  ? losartan (COZAAR) 50 MG tablet Take 50 mg by mouth 2 (two) times daily.   01/20/2022 at 2000  ? sertraline (ZOLOFT) 25 MG tablet Take 25 mg by mouth daily.   01/20/2022 at 2000  ? sodium chloride (OCEAN) 0.65 % SOLN nasal spray Place 1 spray into both nostrils as needed for congestion. 1 Bottle 0 prn at prn  ? sotalol (BETAPACE) 120 MG tablet Take 120 mg by mouth 2 (two) times daily.   01/20/2022 at 2000  ? traZODone (DESYREL) 50 MG tablet Take 25-50 mg by mouth at bedtime as needed for sleep.   prn at prn  ? warfarin (COUMADIN) 1 MG tablet Take 1-2 mg by mouth as directed. On Tuesday and Thursday take 2 (two) tablets with one 5 mg tablet for a total dose of 7 mg. On all other days take 1 (one) tablet with one 5 mg tablet for a total dose of 6 mg.   01/20/2022 at 2000  ? warfarin (COUMADIN) 5 MG tablet Take 5 mg  by mouth daily.   01/20/2022 at 2000  ? aspirin EC 81 MG tablet Take 81 mg by mouth daily. (Patient not taking: Reported on 01/21/2022)   Not Taking  ? torsemide (DEMADEX) 10 MG tablet Take 10 mg by mouth as needed. (Patient not taking: Reported on 01/21/2022)   Not Taking  ? ?Scheduled:  ? ambrisentan  5 mg Oral Daily  ? amLODipine  2.5 mg Oral Daily  ? doxycycline  100 mg Oral Q12H  ? losartan  50 mg Oral BID  ? sertraline  25 mg Oral QHS  ? sotalol  120 mg Oral Q12H  ? Warfarin - Pharmacist Dosing Inpatient   Does not apply q1600  ? ? ?Assessment: ?76 year old female on warfarin PTA for Afib presents with cough and shortness of breath. PMH of pulmonary hypertension, HTN, and Afib. CHADSVASc 3, moderate risk for clotting. CT chest shows likely pneumonia, no evidence of acute PE. Pharmacy consulted to manage warfarin. ? ?Planning to discharge on cefpodoxime and doxycycline, stop date 3/13. ? ?Home regimen: Warfarin 7mg  Tues/Thurs, 6mg  Mon/Wed/Fri/Sat/Sun. ?HgB13.5, Plt 169 ? ?DDI: ?Azithromycin 3/8 >> 3/9 (increase INR) ?Ceftriaxone 3/8 >> (increase INR)  ? ?Date INR  Warfarin Dose  ?3/8 1.9 Warfarin 7mg   ?3/9 2.4 Warfarin 5mg   ?3/10 2.5 Warfarin 6mg   ? ? ?Goal of Therapy:  ?INR 2-3 ?Monitor platelets by anticoagulation protocol: Yes ?  ?Plan: INR is therapeutic, azithromycin discontinued yesterday. ?Gave warfarin 5mg  on 3/9. Seeing the effect of 7mg  warfarin today. ?Predict INR to possibly trend down tomorrow with 5mg  dose. ?Will resume warfarin 6mg  home dose today. ?F/U INR, CBC at least every 3 days per protocol. ? ? , PharmD Candidate ?01/23/2022,7:39 AM ? ? ?

## 2022-01-23 NOTE — Progress Notes (Signed)
[facility] CLINIC CARDIOLOGY CONSULT NOTE       Patient ID: Haley Adams MRN: [id number] DOB/AGE: [birth year] 76 y.o.  Admit date: 01/21/2022 Referring Physician Dr. 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  Cardiology is consulted because of her elevated troponin.  Interval History:  - Qtc yesterday afternoon > 500 after azithromycin given. Improved to 475 ms today.  - Worsening of anterolateral TW inversions compared to baseline. Repeat troponin 78 (improved from peak of 500). No chest pain.  - She had several episodes of diarrhea yesterday, and today has pain with swallowing that radiates to her back.  - No events on telemetry. PVCs.   Review of systems complete and found to be negative unless listed above   Past Medical History:  Diagnosis Date   Anxiety    Atrial fibrillation (HCC)    CHF (congestive heart failure) (HCC)    GERD (gastroesophageal reflux disease)    HLD (hyperlipidemia)    Hypertension    Pulmonary hypertension (HCC)    VSD (ventricular septal defect)     Past Surgical History:  Procedure Laterality Date   CARDIAC CATHETERIZATION     EYE SURGERY     TOTAL ABDOMINAL HYSTERECTOMY W/ BILATERAL SALPINGOOPHORECTOMY      Medications Prior to Admission  Medication Sig Dispense Refill Last Dose   ambrisentan (LETAIRIS) 5 MG tablet Take 5 mg by mouth daily.   01/20/2022 at 2000   amLODipine (NORVASC) 2.5 MG tablet Take 2.5 mg by mouth daily.   01/20/2022 at 1000   diazepam (VALIUM) 2 MG tablet  Take 2 mg by mouth every 6 (six) hours as needed for anxiety.   prn at prn   hydrochlorothiazide (MICROZIDE) 12.5 MG capsule Take 12.5 mg by mouth as directed. Take 12.5 mg twice a week as directed by your doctor.   01/18/2022 at 1000   losartan (COZAAR) 50 MG tablet Take 50 mg by mouth 2 (two) times daily.   01/20/2022 at 2000   sertraline (ZOLOFT) 25 MG tablet Take 25 mg by mouth daily.   01/20/2022 at 2000   sodium chloride (OCEAN) 0.65 % SOLN nasal spray Place 1 spray into both nostrils as needed for congestion. 1 Bottle 0 prn at prn   sotalol (BETAPACE) 120 MG tablet Take 120 mg by mouth 2 (two) times daily.   01/20/2022 at 2000   traZODone (DESYREL) 50 MG tablet Take 25-50 mg by mouth at bedtime as needed for sleep.   prn at prn   warfarin (COUMADIN) 1 MG tablet Take 1-2 mg by mouth as directed. On Tuesday and Thursday take 2 (two) tablets with one 5 mg tablet for a total dose of 7 mg. On all other days take 1 (one) tablet with one 5 mg tablet for a total dose of 6 mg.   01/20/2022 at 2000   warfarin (COUMADIN) 5 MG tablet Take 5 mg by mouth daily.   01/20/2022 at 2000   aspirin EC 81 MG tablet Take 81 mg by mouth daily. (Patient not taking: Reported on 01/21/2022)   Not Taking   torsemide (DEMADEX) 10 MG tablet Take 10 mg  by mouth as needed. (Patient not taking: Reported on 01/21/2022)   Not Taking    Social History   Socioeconomic History   Marital status: Married    Spouse name: Not on file   Number of children: Not on file   Years of education: Not on file   Highest education level: Not on file  Occupational History   Not on file  Tobacco Use   Smoking status: Never   Smokeless tobacco: Never  Vaping Use   Vaping Use: Never used  Substance and Sexual Activity   Alcohol use: No   Drug use: No   Sexual activity: Not on file  Other Topics Concern   Not on file  Social History Narrative   Not on file   Social Determinants of Health   Financial Resource Strain: Not on file  Food  Insecurity: Not on file  Transportation Needs: Not on file  Physical Activity: Not on file  Stress: Not on file  Social Connections: Not on file  Intimate Partner Violence: Not on file    Family History  Problem Relation Age of Onset   Parkinsonism Mother    COPD Father    Cancer Brother    Aneurysm Brother       Review of systems complete and found to be negative unless listed above    PHYSICAL EXAM General: Very pleasant elderly black female, well nourished, in no acute distress. Resting at incline in PCU bed.  HEENT:  Normocephalic and atraumatic. Neck:  No JVD.  Lungs: Normal respiratory effort on room air.  Decreased bibasilar breath sounds without wheezes, crackles. heart: HRRR . Normal S1 and S2.  3/6 systolic murmur.  Radial & DP pulses 2+ bilaterally. Abdomen: Soft, nontender .  Msk: Normal strength and tone for age. Extremities: Warm and well perfused. No clubbing, cyanosis.  No lower extremity edema.  Neuro: Alert and oriented X 3. Psych:  Answers questions appropriately.   Labs:   Lab Results  Component Value Date   WBC 6.3 01/22/2022   HGB 13.5 01/22/2022   HCT 43.5 01/22/2022   MCV 91.4 01/22/2022   PLT 169 01/22/2022    Recent Labs  Lab 01/22/22 0539 01/22/22 1358 01/23/22 0601  NA 138  --   --   K 4.1 3.7 4.5  CL 108  --   --   CO2 23  --   --   BUN 15  --   --   CREATININE 0.60  --   --   CALCIUM 8.8*  --   --   PROT  --  6.3*  --   BILITOT  --  0.6  --   ALKPHOS  --  47  --   ALT  --  13  --   AST  --  19  --   GLUCOSE 87  --   --     Lab Results  Component Value Date   CKTOTAL 56 02/03/2013   CKMB 1.5 02/03/2013   TROPONINI <0.03 09/30/2018     Lab Results  Component Value Date   CHOL 226 (H) 02/03/2013   CHOL 180 07/09/2012   Lab Results  Component Value Date   HDL 37 (L) 02/03/2013   HDL 48 07/09/2012   Lab Results  Component Value Date   LDLCALC 152 (H) 02/03/2013   LDLCALC 111 (H) 07/09/2012   Lab Results   Component Value Date   TRIG 183 02/03/2013   TRIG 104 07/09/2012  No results found for: CHOLHDL No results found for: LDLDIRECT    Radiology: CT Chest Wo Contrast  Result Date: 01/21/2022 CLINICAL DATA:  Initial evaluation for acute productive cough, congestion, shortness of breath. EXAM: CT CHEST WITHOUT CONTRAST TECHNIQUE: Multidetector CT imaging of the chest was performed following the standard protocol without IV contrast. RADIATION DOSE REDUCTION: This exam was performed according to the departmental dose-optimization program which includes automated exposure control, adjustment of the mA and/or kV according to patient size and/or use of iterative reconstruction technique. COMPARISON:  Prior radiograph from earlier the same day as well as prior chest CT from 02/23/2011. FINDINGS: Cardiovascular: Intrathoracic aorta normal in caliber. Mild atherosclerotic change. Visualized great vessels grossly within normal limits. Prominent cardiomegaly noted. Moderate pericardial effusion measuring simple fluid density noted. Main pulmonary artery dilated up to 3.9 cm, which could reflect changes of underlying pulmonary hypertension. Mediastinum/Nodes: Visualized thyroid within normal limits. No visible enlarged mediastinal or hilar lymph nodes on this noncontrast examination. No axillary adenopathy. Esophagus within normal limits. Lungs/Pleura: Tracheobronchial tree intact and patent. Mild volume loss within the left lung with associated scattered subsegmental atelectasis within the lingula and left lower lobe. Small area of patchy and nodular densities within the posterior left upper lobe (series 3, images 39, 44), nonspecific, but suspected to reflect a small area of mild infection/pneumonitis given provided history. No other focal infiltrates or consolidative opacity. No pulmonary edema or pleural effusion. No pneumothorax. No other visible pulmonary nodule or mass. Upper Abdomen: Visualized upper abdomen  demonstrates no acute finding. 8 mm hyperdensity at the posterior aspect of the visualized left kidney noted, nonspecific, but likely a small proteinaceous and/or hemorrhagic cyst. Musculoskeletal: Visualized external soft tissues demonstrate no acute finding. No acute osseous finding. No discrete or worrisome osseous lesions. Moderate multilevel thoracic spondylosis noted. IMPRESSION: 1. Small area of patchy and nodular densities within the posterior left upper lobe, nonspecific, but suspected to reflect a small area of mild infection/pneumonitis given provided history. 2. Cardiomegaly with moderate pericardial effusion. 3. Dilatation of the main pulmonary artery up to 3.9 cm, which could reflect underlying pulmonary hypertension. 4. No other acute cardiopulmonary abnormality identified. Electronically Signed   By: Rise Mu M.D.   On: 01/21/2022 02:51   DG Chest Portable 1 View  Result Date: 01/21/2022 CLINICAL DATA:  Congestion and productive cough. EXAM: PORTABLE CHEST 1 VIEW COMPARISON:  September 30, 2018 FINDINGS: Mild, stable, diffusely increased lung markings are seen without evidence of focal consolidation, pleural effusion or pneumothorax. There is stable moderate to marked severity cardiomegaly with prominence of the perihilar pulmonary vasculature. The visualized skeletal structures are unremarkable. IMPRESSION: Stable cardiomegaly with pulmonary vascular congestion. Electronically Signed   By: Aram Candela M.D.   On: 01/21/2022 01:53   ECHOCARDIOGRAM COMPLETE  Result Date: 01/22/2022    ECHOCARDIOGRAM REPORT   Patient Name:   Haley Adams Date of Exam: 01/22/2022 Medical Rec #:  [id number]        Height:       66.0 in Accession #:    [id number]       Weight:       150.0 lb Date of Birth:  Nov 30, 1945        BSA:          1.770 m Patient Age:    75 years         BP:           133/65 mmHg Patient Gender: F  HR:           58 bpm. Exam Location:  [facility] Procedure: 2D Echo,  Cardiac Doppler and Color Doppler Indications:     Elevated Troponin  History:         Patient has prior history of Echocardiogram examinations, most                  recent 11/12/2016. CHF, Pulmonary HTN, Arrythmias:Atrial                  Fibrillation; Risk Factors:Dyslipidemia. VSD.  Sonographer:     Cristela Blue Referring Phys:  [id number] Alycia Rossetti MATTHEW Haley Adams Diagnosing Phys: Sena Slate IMPRESSIONS  1. Large muscular VSD. LVID 6.2 (prior 6.0 in 01/2021). Left ventricular ejection fraction, by estimation, is 50 to 55%. The left ventricle has low normal function. The left ventricle has no regional wall motion abnormalities. The left ventricular internal cavity size was moderately dilated. There is mild left ventricular hypertrophy. Left ventricular diastolic parameters are consistent with Grade II diastolic dysfunction (pseudonormalization).  2. TR jet inadequate to accurately assess RVSP; likely severely elevated. Right ventricular systolic function is mildly reduced. The right ventricular size is moderately enlarged. Mildly increased right ventricular wall thickness.  3. Left atrial size was severely dilated.  4. Right atrial size was severely dilated.  5. Moderate pericardial effusion. The pericardial effusion is circumferential. There is no evidence of cardiac tamponade.  6. The mitral valve is degenerative. Mild mitral valve regurgitation. No evidence of mitral stenosis.  7. The tricuspid valve is degenerative. Tricuspid valve regurgitation is moderate.  8. The aortic valve is normal in structure. Aortic valve regurgitation is not visualized. No aortic stenosis is present.  9. The inferior vena cava is normal in size with greater than 50% respiratory variability, suggesting right atrial pressure of 3 mmHg. FINDINGS  Left Ventricle: Large muscular VSD. LVID 6.2 (prior 6.0 in 01/2021). Left ventricular ejection fraction, by estimation, is 50 to 55%. The left ventricle has low normal function. The left ventricle has no  regional wall motion abnormalities. The left ventricular internal cavity size was moderately dilated. There is mild left ventricular hypertrophy. Left ventricular diastolic parameters are consistent with Grade II diastolic dysfunction (pseudonormalization). Right Ventricle: TR jet inadequate to accurately assess RVSP; likely severely elevated. The right ventricular size is moderately enlarged. Mildly increased right ventricular wall thickness. Right ventricular systolic function is mildly reduced. Left Atrium: Left atrial size was severely dilated. Right Atrium: Right atrial size was severely dilated. Pericardium: A moderately sized pericardial effusion is present. The pericardial effusion is circumferential. There is no evidence of cardiac tamponade. Mitral Valve: The mitral valve is degenerative in appearance. Mild mitral valve regurgitation. No evidence of mitral valve stenosis. MV peak gradient, 3.8 mmHg. The mean mitral valve gradient is 2.0 mmHg. Tricuspid Valve: The tricuspid valve is degenerative in appearance. Tricuspid valve regurgitation is moderate. Aortic Valve: The aortic valve is normal in structure. Aortic valve regurgitation is not visualized. No aortic stenosis is present. Aortic valve mean gradient measures 2.0 mmHg. Aortic valve peak gradient measures 3.6 mmHg. Aortic valve area, by VTI measures 3.08 cm. Pulmonic Valve: The pulmonic valve was grossly normal. Pulmonic valve regurgitation is mild. No evidence of pulmonic stenosis. Aorta: The aortic root and ascending aorta are structurally normal, with no evidence of dilitation. Venous: The inferior vena cava is normal in size with greater than 50% respiratory variability, suggesting right atrial pressure of 3 mmHg. IAS/Shunts: The interatrial septum was not assessed.  LEFT VENTRICLE PLAX 2D LVIDd:         6.20 cm   Diastology LVIDs:         4.00 cm   LV e' medial:    4.46 cm/s LV PW:         0.90 cm   LV E/e' medial:  15.2 LV IVS:        1.30 cm    LV e' lateral:   5.00 cm/s LVOT diam:     2.00 cm   LV E/e' lateral: 13.5 LV SV:         50 LV SV Index:   28 LVOT Area:     3.14 cm  RIGHT VENTRICLE RV Basal diam:  4.90 cm RV S prime:     11.50 cm/s TAPSE (M-mode): 1.8 cm LEFT ATRIUM             Index        RIGHT ATRIUM           Index LA diam:        5.10 cm 2.88 cm/m   RA Area:     21.90 cm LA Vol (A2C):   85.7 ml 48.43 ml/m  RA Volume:   71.60 ml  40.46 ml/m LA Vol (A4C):   87.3 ml 49.33 ml/m LA Biplane Vol: 90.5 ml 51.14 ml/m  AORTIC VALVE                    PULMONIC VALVE AV Area (Vmax):    3.27 cm     PV Vmax:          0.71 m/s AV Area (Vmean):   2.86 cm     PV Vmean:         50.900 cm/s AV Area (VTI):     3.08 cm     PV VTI:           0.147 m AV Vmax:           94.70 cm/s   PV Peak grad:     2.0 mmHg AV Vmean:          59.000 cm/s  PV Mean grad:     1.0 mmHg AV VTI:            0.162 m      PR End Diast Vel: 8.07 msec AV Peak Grad:      3.6 mmHg     RVOT Peak grad:   5 mmHg AV Mean Grad:      2.0 mmHg LVOT Vmax:         98.60 cm/s LVOT Vmean:        53.700 cm/s LVOT VTI:          0.159 m LVOT/AV VTI ratio: 0.98  AORTA Ao Root diam: 2.60 cm MITRAL VALVE               TRICUSPID VALVE MV Area (PHT): 3.21 cm    TR Peak grad:   55.7 mmHg MV Area VTI:   1.93 cm    TR Vmax:        373.00 cm/s MV Peak grad:  3.8 mmHg MV Mean grad:  2.0 mmHg    SHUNTS MV Vmax:       0.98 m/s    Systemic VTI:  0.16 m MV Vmean:      70.5 cm/s   Systemic Diam: 2.00 cm MV Decel Time: 236 msec    Pulmonic VTI:  0.302 m  MV E velocity: 67.70 cm/s MV A velocity: 59.10 cm/s MV E/A ratio:  1.15 Sena Slate Electronically signed by Sena Slate Signature Date/Time: 01/22/2022/10:51:26 AM    Final     ECHO 02/11/2021   MILD LV DYSFUNCTION (See above) WITH MILD LVH    ELEVATED LA PRESSURES WITH DIASTOLIC DYSFUNCTION    MILD RV SYSTOLIC DYSFUNCTION (See above)    VALVULAR REGURGITATION: MILD MR, MILD PR, MODERATE TR    NO VALVULAR STENOSIS    SMALL PERICARDIAL EFFUSION (See above)     SMALL IAFC    LV EF LOWER LIMITS OF NORMAL.    LARGE UNRESTRICTED VSD WITH BIDIRECTIONAL FLOW, PEAK VELOCITY OF 1.32m/s.    IAFC NOTED BY COLOR AND SPECTRAL DOPPLER.    3D acquisition and reconstructions were performed as part of this    examination to more accurately quantify the effects of identified    structural abnormalities as part of the exam. (post-processing on an    Independent workstation).     Compared with prior Echo study on 12/21/2018: TR AND RVSP INCREASED.    RV AND LV FUNCTION SLIGHTLY DECREASED.    PFO NOTED ON TODAY'S EXAM.   TELEMETRY reviewed by me: Sinus rhythm with rate between 57-91, PVCs  EKG reviewed by me: Sinus rhythm rate 93, anterolateral T wave inversions that appear unchanged prior EKG from 2019.  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  Cardiology is consulted because of her elevated troponin.  #Elevated troponin likely 2/2 demand ischemia #Acute hypoxic respiratory failure #Community-acquired pneumonia #Palpitations #TW inversions and ST depressions anterolateral leads Patient presents with 3 days of upper respiratory symptoms of nasal and chest congestion, productive cough, fatigue, chills and has noticed an increased heart rate mostly when coughing.  Her troponins trended [phone] and she denies chest pain. Has been feeling better since she has been in the ED. troponin elevation is likely due to demand ischemia and not ACS.  EKG repeated today for Qtc shows TW inversions throughout anterolateral leads, worse compared to prior EKG's. No chest pain, troponin 78.   -Azithro caused QT prolongation on 01/22/22, improved today on  doxy -weaned to room air during interview and O2 sat at baseline (90-91% per patient reporting) but still with some DOE needing supplemental O2. Baseline 4L at night only.  -Continuous monitoring on telemetry while inpatient, will likely send patient home with live event monitor at discharge for further monitoring of arrhythmias with her palpitations -No indication for invasive cardiac diagnostics at this time. - Will try to review case with her pulmonary HTN specialist prior to discharge.  - Having diarrhea and pain with eating today, so will investigate this prior to DC.   #Unrepaired VSD with concomitant pulmonary hypertension (Eisenmenger's) -Continue Ambrisentan 5 mg once daily -Echo resulted with slightly larger LVID at 6.2, (previously 6.0). EF 55-60%, and g2 DD, severe biatrial dilation, moderate circumferential pericardial effusion  #Paroxysmal atrial fibrillation remains in normal rhythm with periods of sinus brady -Continue sotalol 120 mg bid and warfarin. Current INR 2.4.  Appreciate pharmacy assistance with dosing Monitor potassium and magnesium daily. Replete to goal of 2 and 4 respectively Please repeat EKG tomorrow morning.  Avoid QTC prolonging medications.  CHA2DS2-VASc 3  #Hypertension  She is on losartan 50 mg BID, HCTZ 12.5 as needed for weight gain at home.  Blood pressure goal at least 130 systolic, per patient    Signed: Armando ReichertYAN MATTHEW Kenly Xiao , MD 01/23/2022, 4:47 PM [facility]

## 2022-01-24 LAB — PROTIME-INR
INR: 2.3 — ABNORMAL HIGH (ref 0.8–1.2)
Prothrombin Time: 25.5 seconds — ABNORMAL HIGH (ref 11.4–15.2)

## 2022-01-24 MED ORDER — CEFPODOXIME PROXETIL 200 MG PO TABS: 200.0000 mg | ORAL_TABLET | Freq: Two times a day (BID) | ORAL | 0 refills | Status: AC

## 2022-01-24 MED ORDER — ALUM & MAG HYDROXIDE-SIMETH 200-200-20 MG/5ML PO SUSP: 30.0000 mL | Freq: Four times a day (QID) | ORAL | 0 refills | Status: AC | PRN

## 2022-01-24 MED ORDER — ROSUVASTATIN CALCIUM 10 MG PO TABS: 10.0000 mg | ORAL_TABLET | Freq: Every day | ORAL | Status: DC

## 2022-01-24 MED ORDER — ASPIRIN EC 81 MG PO TBEC: 81.0000 mg | DELAYED_RELEASE_TABLET | Freq: Every day | ORAL | 1 refills | Status: AC

## 2022-01-24 MED ORDER — WARFARIN SODIUM 6 MG PO TABS
6.0000 mg | ORAL_TABLET | Freq: Once | ORAL | Status: DC
  Filled 2022-01-24: qty 1

## 2022-01-24 MED ORDER — ASPIRIN EC 81 MG PO TBEC
81.0000 mg | DELAYED_RELEASE_TABLET | Freq: Every day | ORAL | Status: DC
  Administered 2022-01-24: 81 mg via ORAL
  Filled 2022-01-24: qty 1

## 2022-01-24 MED ORDER — PANTOPRAZOLE SODIUM 40 MG PO TBEC: 40.0000 mg | DELAYED_RELEASE_TABLET | Freq: Every day | ORAL | 0 refills | Status: AC

## 2022-01-24 NOTE — Progress Notes (Addendum)
[facility] CLINIC CARDIOLOGY CONSULT NOTE       Patient ID: Cayden Rautio MRN: [id number] DOB/AGE: [birth year] 75 y.o.  Admit date: 01/21/2022 Referring Physician Dr. 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  Cardiology is consulted because of her elevated troponin.  Interval History:  - Feeling well this morning.  - Swallowing discomfort and back pain with swallowing improved. No further episodes of diarrhea.  - Need to assess for oxygen requirement tolerating O2 sat 88%  Review of systems complete and found to be negative unless listed above   Past Medical History:  Diagnosis Date   Anxiety    Atrial fibrillation (HCC)    CHF (congestive heart failure) (HCC)    GERD (gastroesophageal reflux disease)    HLD (hyperlipidemia)    Hypertension    Pulmonary hypertension (HCC)    VSD (ventricular septal defect)     Past Surgical History:  Procedure Laterality Date   CARDIAC CATHETERIZATION     EYE SURGERY     TOTAL ABDOMINAL HYSTERECTOMY W/ BILATERAL SALPINGOOPHORECTOMY      Medications Prior to Admission  Medication Sig Dispense Refill Last Dose   ambrisentan (LETAIRIS) 5 MG tablet Take 5 mg by mouth daily.   01/20/2022 at 2000   amLODipine (NORVASC) 2.5 MG tablet Take 2.5 mg by mouth daily.   01/20/2022 at 1000   diazepam (VALIUM) 2 MG tablet Take 2 mg by mouth every 6 (six) hours as needed for anxiety.   prn at prn   hydrochlorothiazide (MICROZIDE) 12.5 MG capsule Take 12.5 mg by mouth as directed. Take 12.5 mg  twice a week as directed by your doctor.   01/18/2022 at 1000   losartan (COZAAR) 50 MG tablet Take 50 mg by mouth 2 (two) times daily.   01/20/2022 at 2000   sertraline (ZOLOFT) 25 MG tablet Take 25 mg by mouth daily.   01/20/2022 at 2000   sodium chloride (OCEAN) 0.65 % SOLN nasal spray Place 1 spray into both nostrils as needed for congestion. 1 Bottle 0 prn at prn   sotalol (BETAPACE) 120 MG tablet Take 120 mg by mouth 2 (two) times daily.   01/20/2022 at 2000   traZODone (DESYREL) 50 MG tablet Take 25-50 mg by mouth at bedtime as needed for sleep.   prn at prn   warfarin (COUMADIN) 1 MG tablet Take 1-2 mg by mouth as directed. On Tuesday and Thursday take 2 (two) tablets with one 5 mg tablet for a total dose of 7 mg. On all other days take 1 (one) tablet with one 5 mg tablet for a total dose of 6 mg.   01/20/2022 at 2000   warfarin (COUMADIN) 5 MG tablet Take 5 mg by mouth daily.   01/20/2022 at 2000   aspirin EC 81 MG tablet Take 81 mg by mouth daily. (Patient not taking: Reported on 01/21/2022)   Not Taking   torsemide (DEMADEX) 10 MG tablet Take 10 mg by mouth as needed. (Patient not taking: Reported on 01/21/2022)   Not Taking    Social History   Socioeconomic History   Marital status: Married  Spouse name: Not on file   Number of children: Not on file   Years of education: Not on file   Highest education level: Not on file  Occupational History   Not on file  Tobacco Use   Smoking status: Never   Smokeless tobacco: Never  Vaping Use   Vaping Use: Never used  Substance and Sexual Activity   Alcohol use: No   Drug use: No   Sexual activity: Not on file  Other Topics Concern   Not on file  Social History Narrative   Not on file   Social Determinants of Health   Financial Resource Strain: Not on file  Food Insecurity: Not on file  Transportation Needs: Not on file  Physical Activity: Not on file  Stress: Not on file  Social Connections: Not on file  Intimate Partner Violence: Not  on file    Family History  Problem Relation Age of Onset   Parkinsonism Mother    COPD Father    Cancer Brother    Aneurysm Brother       Review of systems complete and found to be negative unless listed above    PHYSICAL EXAM General: Very pleasant elderly black female, well nourished, in no acute distress. Resting at incline in PCU bed.  HEENT:  Normocephalic and atraumatic. Neck:  No JVD.  Lungs: Normal respiratory effort on room air.  Decreased bibasilar breath sounds without wheezes, crackles. heart: HRRR . Normal S1 and S2.  3/6 systolic murmur.  Radial & DP pulses 2+ bilaterally. Abdomen: Soft, nontender .  Msk: Normal strength and tone for age. Extremities: Warm and well perfused. No clubbing, cyanosis.  No lower extremity edema.  Neuro: Alert and oriented X 3. Psych:  Answers questions appropriately.   Labs:   Lab Results  Component Value Date   WBC 6.3 01/22/2022   HGB 13.5 01/22/2022   HCT 43.5 01/22/2022   MCV 91.4 01/22/2022   PLT 169 01/22/2022    Recent Labs  Lab 01/22/22 0539 01/22/22 1358 01/23/22 0601  NA 138  --   --   K 4.1 3.7 4.5  CL 108  --   --   CO2 23  --   --   BUN 15  --   --   CREATININE 0.60  --   --   CALCIUM 8.8*  --   --   PROT  --  6.3*  --   BILITOT  --  0.6  --   ALKPHOS  --  47  --   ALT  --  13  --   AST  --  19  --   GLUCOSE 87  --   --     Lab Results  Component Value Date   CKTOTAL 56 02/03/2013   CKMB 1.5 02/03/2013   TROPONINI <0.03 09/30/2018     Lab Results  Component Value Date   CHOL 226 (H) 02/03/2013   CHOL 180 07/09/2012   Lab Results  Component Value Date   HDL 37 (L) 02/03/2013   HDL 48 07/09/2012   Lab Results  Component Value Date   LDLCALC 152 (H) 02/03/2013   LDLCALC 111 (H) 07/09/2012   Lab Results  Component Value Date   TRIG 183 02/03/2013   TRIG 104 07/09/2012   No results found for: CHOLHDL No results found for: LDLDIRECT    Radiology: CT Chest Wo Contrast  Result Date:  01/21/2022 CLINICAL DATA:  Initial evaluation for acute productive cough,  congestion, shortness of breath. EXAM: CT CHEST WITHOUT CONTRAST TECHNIQUE: Multidetector CT imaging of the chest was performed following the standard protocol without IV contrast. RADIATION DOSE REDUCTION: This exam was performed according to the departmental dose-optimization program which includes automated exposure control, adjustment of the mA and/or kV according to patient size and/or use of iterative reconstruction technique. COMPARISON:  Prior radiograph from earlier the same day as well as prior chest CT from 02/23/2011. FINDINGS: Cardiovascular: Intrathoracic aorta normal in caliber. Mild atherosclerotic change. Visualized great vessels grossly within normal limits. Prominent cardiomegaly noted. Moderate pericardial effusion measuring simple fluid density noted. Main pulmonary artery dilated up to 3.9 cm, which could reflect changes of underlying pulmonary hypertension. Mediastinum/Nodes: Visualized thyroid within normal limits. No visible enlarged mediastinal or hilar lymph nodes on this noncontrast examination. No axillary adenopathy. Esophagus within normal limits. Lungs/Pleura: Tracheobronchial tree intact and patent. Mild volume loss within the left lung with associated scattered subsegmental atelectasis within the lingula and left lower lobe. Small area of patchy and nodular densities within the posterior left upper lobe (series 3, images 39, 44), nonspecific, but suspected to reflect a small area of mild infection/pneumonitis given provided history. No other focal infiltrates or consolidative opacity. No pulmonary edema or pleural effusion. No pneumothorax. No other visible pulmonary nodule or mass. Upper Abdomen: Visualized upper abdomen demonstrates no acute finding. 8 mm hyperdensity at the posterior aspect of the visualized left kidney noted, nonspecific, but likely a small proteinaceous and/or hemorrhagic cyst.  Musculoskeletal: Visualized external soft tissues demonstrate no acute finding. No acute osseous finding. No discrete or worrisome osseous lesions. Moderate multilevel thoracic spondylosis noted. IMPRESSION: 1. Small area of patchy and nodular densities within the posterior left upper lobe, nonspecific, but suspected to reflect a small area of mild infection/pneumonitis given provided history. 2. Cardiomegaly with moderate pericardial effusion. 3. Dilatation of the main pulmonary artery up to 3.9 cm, which could reflect underlying pulmonary hypertension. 4. No other acute cardiopulmonary abnormality identified. Electronically Signed   By: Rise Mu M.D.   On: 01/21/2022 02:51   DG Chest Portable 1 View  Result Date: 01/21/2022 CLINICAL DATA:  Congestion and productive cough. EXAM: PORTABLE CHEST 1 VIEW COMPARISON:  September 30, 2018 FINDINGS: Mild, stable, diffusely increased lung markings are seen without evidence of focal consolidation, pleural effusion or pneumothorax. There is stable moderate to marked severity cardiomegaly with prominence of the perihilar pulmonary vasculature. The visualized skeletal structures are unremarkable. IMPRESSION: Stable cardiomegaly with pulmonary vascular congestion. Electronically Signed   By: Aram Candela M.D.   On: 01/21/2022 01:53   ECHOCARDIOGRAM COMPLETE  Result Date: 01/22/2022    ECHOCARDIOGRAM REPORT   Patient Name:   CHESNI VOS Date of Exam: 01/22/2022 Medical Rec #:  [id number]        Height:       66.0 in Accession #:    [id number]       Weight:       150.0 lb Date of Birth:  1946/05/15        BSA:          1.770 m Patient Age:    75 years         BP:           133/65 mmHg Patient Gender: F                HR:           58 bpm. Exam Location:  [facility] Procedure: 2D Echo, Cardiac Doppler  and Color Doppler Indications:     Elevated Troponin  History:         Patient has prior history of Echocardiogram examinations, most                  recent  11/12/2016. CHF, Pulmonary HTN, Arrythmias:Atrial                  Fibrillation; Risk Factors:Dyslipidemia. VSD.  Sonographer:     Cristela Blue Referring Phys:  [id number] Alycia Rossetti MATTHEW Jaclynne Baldo Diagnosing Phys: Sena Slate IMPRESSIONS  1. Large muscular VSD. LVID 6.2 (prior 6.0 in 01/2021). Left ventricular ejection fraction, by estimation, is 50 to 55%. The left ventricle has low normal function. The left ventricle has no regional wall motion abnormalities. The left ventricular internal cavity size was moderately dilated. There is mild left ventricular hypertrophy. Left ventricular diastolic parameters are consistent with Grade II diastolic dysfunction (pseudonormalization).  2. TR jet inadequate to accurately assess RVSP; likely severely elevated. Right ventricular systolic function is mildly reduced. The right ventricular size is moderately enlarged. Mildly increased right ventricular wall thickness.  3. Left atrial size was severely dilated.  4. Right atrial size was severely dilated.  5. Moderate pericardial effusion. The pericardial effusion is circumferential. There is no evidence of cardiac tamponade.  6. The mitral valve is degenerative. Mild mitral valve regurgitation. No evidence of mitral stenosis.  7. The tricuspid valve is degenerative. Tricuspid valve regurgitation is moderate.  8. The aortic valve is normal in structure. Aortic valve regurgitation is not visualized. No aortic stenosis is present.  9. The inferior vena cava is normal in size with greater than 50% respiratory variability, suggesting right atrial pressure of 3 mmHg. FINDINGS  Left Ventricle: Large muscular VSD. LVID 6.2 (prior 6.0 in 01/2021). Left ventricular ejection fraction, by estimation, is 50 to 55%. The left ventricle has low normal function. The left ventricle has no regional wall motion abnormalities. The left ventricular internal cavity size was moderately dilated. There is mild left ventricular hypertrophy. Left ventricular diastolic  parameters are consistent with Grade II diastolic dysfunction (pseudonormalization). Right Ventricle: TR jet inadequate to accurately assess RVSP; likely severely elevated. The right ventricular size is moderately enlarged. Mildly increased right ventricular wall thickness. Right ventricular systolic function is mildly reduced. Left Atrium: Left atrial size was severely dilated. Right Atrium: Right atrial size was severely dilated. Pericardium: A moderately sized pericardial effusion is present. The pericardial effusion is circumferential. There is no evidence of cardiac tamponade. Mitral Valve: The mitral valve is degenerative in appearance. Mild mitral valve regurgitation. No evidence of mitral valve stenosis. MV peak gradient, 3.8 mmHg. The mean mitral valve gradient is 2.0 mmHg. Tricuspid Valve: The tricuspid valve is degenerative in appearance. Tricuspid valve regurgitation is moderate. Aortic Valve: The aortic valve is normal in structure. Aortic valve regurgitation is not visualized. No aortic stenosis is present. Aortic valve mean gradient measures 2.0 mmHg. Aortic valve peak gradient measures 3.6 mmHg. Aortic valve area, by VTI measures 3.08 cm. Pulmonic Valve: The pulmonic valve was grossly normal. Pulmonic valve regurgitation is mild. No evidence of pulmonic stenosis. Aorta: The aortic root and ascending aorta are structurally normal, with no evidence of dilitation. Venous: The inferior vena cava is normal in size with greater than 50% respiratory variability, suggesting right atrial pressure of 3 mmHg. IAS/Shunts: The interatrial septum was not assessed.  LEFT VENTRICLE PLAX 2D LVIDd:         6.20 cm   Diastology LVIDs:  4.00 cm   LV e' medial:    4.46 cm/s LV PW:         0.90 cm   LV E/e' medial:  15.2 LV IVS:        1.30 cm   LV e' lateral:   5.00 cm/s LVOT diam:     2.00 cm   LV E/e' lateral: 13.5 LV SV:         50 LV SV Index:   28 LVOT Area:     3.14 cm  RIGHT VENTRICLE RV Basal diam:  4.90  cm RV S prime:     11.50 cm/s TAPSE (M-mode): 1.8 cm LEFT ATRIUM             Index        RIGHT ATRIUM           Index LA diam:        5.10 cm 2.88 cm/m   RA Area:     21.90 cm LA Vol (A2C):   85.7 ml 48.43 ml/m  RA Volume:   71.60 ml  40.46 ml/m LA Vol (A4C):   87.3 ml 49.33 ml/m LA Biplane Vol: 90.5 ml 51.14 ml/m  AORTIC VALVE                    PULMONIC VALVE AV Area (Vmax):    3.27 cm     PV Vmax:          0.71 m/s AV Area (Vmean):   2.86 cm     PV Vmean:         50.900 cm/s AV Area (VTI):     3.08 cm     PV VTI:           0.147 m AV Vmax:           94.70 cm/s   PV Peak grad:     2.0 mmHg AV Vmean:          59.000 cm/s  PV Mean grad:     1.0 mmHg AV VTI:            0.162 m      PR End Diast Vel: 8.07 msec AV Peak Grad:      3.6 mmHg     RVOT Peak grad:   5 mmHg AV Mean Grad:      2.0 mmHg LVOT Vmax:         98.60 cm/s LVOT Vmean:        53.700 cm/s LVOT VTI:          0.159 m LVOT/AV VTI ratio: 0.98  AORTA Ao Root diam: 2.60 cm MITRAL VALVE               TRICUSPID VALVE MV Area (PHT): 3.21 cm    TR Peak grad:   55.7 mmHg MV Area VTI:   1.93 cm    TR Vmax:        373.00 cm/s MV Peak grad:  3.8 mmHg MV Mean grad:  2.0 mmHg    SHUNTS MV Vmax:       0.98 m/s    Systemic VTI:  0.16 m MV Vmean:      70.5 cm/s   Systemic Diam: 2.00 cm MV Decel Time: 236 msec    Pulmonic VTI:  0.302 m MV E velocity: 67.70 cm/s MV A velocity: 59.10 cm/s MV E/A ratio:  1.15 Sena Slateyan Klover Priestly Electronically signed by Sena Slateyan Antigone Crowell Signature Date/Time: 01/22/2022/10:51:26 AM  Final     ECHO 02/11/2021   MILD LV DYSFUNCTION (See above) WITH MILD LVH    ELEVATED LA PRESSURES WITH DIASTOLIC DYSFUNCTION    MILD RV SYSTOLIC DYSFUNCTION (See above)    VALVULAR REGURGITATION: MILD MR, MILD PR, MODERATE TR    NO VALVULAR STENOSIS    SMALL PERICARDIAL EFFUSION (See above)    SMALL IAFC    LV EF LOWER LIMITS OF NORMAL.    LARGE UNRESTRICTED VSD WITH BIDIRECTIONAL FLOW, PEAK VELOCITY OF 1.51m/s.    IAFC NOTED BY COLOR AND SPECTRAL DOPPLER.     3D acquisition and reconstructions were performed as part of this    examination to more accurately quantify the effects of identified    structural abnormalities as part of the exam. (post-processing on an    Independent workstation).     Compared with prior Echo study on 12/21/2018: TR AND RVSP INCREASED.    RV AND LV FUNCTION SLIGHTLY DECREASED.    PFO NOTED ON TODAY'S EXAM.   TELEMETRY reviewed by me: Sinus rhythm with rate between 57-91, PVCs  EKG reviewed by me: Sinus rhythm rate 93, anterolateral T wave inversions that appear unchanged prior EKG from 2019.  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  Cardiology is consulted because of her elevated troponin.  #Elevated troponin likely 2/2 demand ischemia #Acute hypoxic respiratory failure #Community-acquired pneumonia #Palpitations #TW inversions and ST depressions anterolateral leads Patient presents with 3 days of upper respiratory symptoms of nasal and chest congestion, productive cough, fatigue, chills and has noticed an increased heart rate mostly when coughing.  Her troponins trended [phone] and she denies chest pain. Has been feeling better since she has been in the ED. troponin elevation is likely due to demand ischemia from pneumonia (or arrhythmia) and not ACS.  EKG repeated for Qtc shows TW inversions throughout anterolateral leads, worse compared to prior EKG's. No chest pain, troponin 78.   -Azithro caused QT prolongation on 01/22/22, improved today on doxy -Feels back to baseline. O2 sat 90-93% on RA at rest, mid 80's with ambulation. Note this is the same as her from 08/2021 -Continuous  monitoring on telemetry while inpatient. She will have event monitor placed by her primary cardiologist on Monday -No indication for invasive cardiac diagnostics at this time. - Reviewed case with her primary pulmonary HTN doctor Marlane Mingle who provided some insight. Since she is feeling well, we will discharge with close outpatient follow up.  - Recommend starting aspirin 81 mg - Discussed statin therapy with patient but she states she has severe myalgias with all of these.  - Lipid panel 09/2021- TC 219, LDL 149, HDL 53, TG 86  Patient has follow up with Dr. Elesa Massed on 3/20 and will have follow up with Dr. Monia Pouch as well.   #Unrepaired VSD with concomitant pulmonary hypertension (Eisenmenger's) -Continue Ambrisentan 5 mg once daily -Echo resulted with slightly larger LVID at 6.2, (previously 6.0). EF 55-60%, and g2 DD, severe biatrial dilation, moderate circumferential pericardial effusion  #Paroxysmal atrial fibrillation remains in normal rhythm with periods of sinus brady -Continue sotalol 120 mg bid and warfarin. Current INR 2.4.  Appreciate pharmacy assistance with dosing Monitor potassium and magnesium daily. Replete to goal of 2  and 4 respectively EKG with Qtc of 479 today; stable.  Avoid QTC prolonging medications.  CHA2DS2-VASc 3  #Hypertension She is on losartan 50 mg BID, HCTZ 12.5 as needed for weight gain at home.  Blood pressure goal at least 130 systolic, per patient   Signed: Armando Reichert , MD 01/24/2022, 8:17 AM [facility]

## 2022-01-24 NOTE — Consult Note (Signed)
ANTICOAGULATION CONSULT NOTE - Follow Up Consult ? ?Pharmacy Consult for warfarin ?Indication: atrial fibrillation ? ?Allergies  ?Allergen Reactions  ? Penicillins Other (See Comments)  ?  Has patient had a PCN reaction causing immediate rash, facial/tongue/throat swelling, SOB or lightheadedness with hypotension: no ?Has patient had a PCN reaction causing severe rash involving mucus membranes or skin necrosis: no ?Has patient had a PCN reaction that required hospitalization no ?Has patient had a PCN reaction occurring within the last 10 years: no ?If all of the above answers are "NO", then may proceed with Cephalosporin use. ? ?Fever 105   ? Sulfa Antibiotics   ? Tylenol [Acetaminophen]   ? ? ?Patient Measurements: ?Height: 5\' 6"  (167.6 cm) ?Weight: 68 kg (150 lb) ?IBW/kg (Calculated) : 59.3 ? ?Vital Signs: ?Temp: 98 ?F (36.7 ?C) (03/11 0740) ?Temp Source: Oral (03/11 0417) ?BP: 138/80 (03/11 0740) ?Pulse Rate: 70 (03/11 0957) ? ?Labs: ?Recent Labs  ?  01/21/22 ?1121 01/22/22 ?03/24/22 01/23/22 ?0601 01/23/22 ?1100 01/24/22 ?0459  ?HGB  --  13.5  --   --   --   ?HCT  --  43.5  --   --   --   ?PLT  --  169  --   --   --   ?LABPROT  --  25.8* 26.9*  --  25.5*  ?INR  --  2.4* 2.5*  --  2.3*  ?CREATININE  --  0.60  --   --   --   ?TROPONINIHS 495*  --   --  78*  --   ? ? ? ?Estimated Creatinine Clearance: 57.8 mL/min (by C-G formula based on SCr of 0.6 mg/dL). ? ? ?Medications:  ?Medications Prior to Admission  ?Medication Sig Dispense Refill Last Dose  ? ambrisentan (LETAIRIS) 5 MG tablet Take 5 mg by mouth daily.   01/20/2022 at 2000  ? amLODipine (NORVASC) 2.5 MG tablet Take 2.5 mg by mouth daily.   01/20/2022 at 1000  ? diazepam (VALIUM) 2 MG tablet Take 2 mg by mouth every 6 (six) hours as needed for anxiety.   prn at prn  ? hydrochlorothiazide (MICROZIDE) 12.5 MG capsule Take 12.5 mg by mouth as directed. Take 12.5 mg twice a week as directed by your doctor.   01/18/2022 at 1000  ? losartan (COZAAR) 50 MG tablet Take 50 mg  by mouth 2 (two) times daily.   01/20/2022 at 2000  ? sertraline (ZOLOFT) 25 MG tablet Take 25 mg by mouth daily.   01/20/2022 at 2000  ? sodium chloride (OCEAN) 0.65 % SOLN nasal spray Place 1 spray into both nostrils as needed for congestion. 1 Bottle 0 prn at prn  ? sotalol (BETAPACE) 120 MG tablet Take 120 mg by mouth 2 (two) times daily.   01/20/2022 at 2000  ? traZODone (DESYREL) 50 MG tablet Take 25-50 mg by mouth at bedtime as needed for sleep.   prn at prn  ? warfarin (COUMADIN) 1 MG tablet Take 1-2 mg by mouth as directed. On Tuesday and Thursday take 2 (two) tablets with one 5 mg tablet for a total dose of 7 mg. On all other days take 1 (one) tablet with one 5 mg tablet for a total dose of 6 mg.   01/20/2022 at 2000  ? warfarin (COUMADIN) 5 MG tablet Take 5 mg by mouth daily.   01/20/2022 at 2000  ? aspirin EC 81 MG tablet Take 81 mg by mouth daily. (Patient not taking: Reported on 01/21/2022)  Not Taking  ? torsemide (DEMADEX) 10 MG tablet Take 10 mg by mouth as needed. (Patient not taking: Reported on 01/21/2022)   Not Taking  ? ?Scheduled:  ? ambrisentan  5 mg Oral Daily  ? amLODipine  2.5 mg Oral Daily  ? aspirin EC  81 mg Oral Daily  ? losartan  50 mg Oral BID  ? pantoprazole  40 mg Oral Daily  ? sertraline  25 mg Oral QHS  ? sotalol  120 mg Oral Q12H  ? Warfarin - Pharmacist Dosing Inpatient   Does not apply q1600  ? ? ?Assessment: ?76 year old female on warfarin PTA for Afib presents with cough and shortness of breath. PMH of pulmonary hypertension, HTN, and Afib. CHADSVASc 3, moderate risk for clotting. CT chest shows likely pneumonia, no evidence of acute PE. Pharmacy consulted to manage warfarin. ? ?Planning to discharge on cefpodoxime and doxycycline, stop date 3/13. ? ?Home regimen: Warfarin 7mg  Tues/Thurs, 6mg  Mon/Wed/Fri/Sat/Sun. ?HgB13.5, Plt 169 ? ?DDI: ?Azithromycin 3/8 >> 3/9 (increase INR) ?Ceftriaxone 3/8 >> (increase INR)  ? ?Date INR Warfarin Dose  ?3/8 1.9 7 mg  ?3/9 2.4 5 mg  ?3/10 2.5 6 mg   ?3/11 2.3 6 mg  ? ? ?Goal of Therapy:  ?INR 2-3 ?Monitor platelets by anticoagulation protocol: Yes ?  ?Plan:  ?INR is therapeutic. Will order warfarin 6 mg x 1 tonight (home regimen). Daily INR and CBC at least every 3 days. Pt still currently on abx.  ? ?5/9, PharmD  ?01/24/2022,10:06 AM ? ? ?

## 2022-01-24 NOTE — Discharge Summary (Signed)
Physician Discharge Summary  Philippa ChesterCynthia Peavler RUE:[id number] DOB: 06-Oct-1946  [place] [facility]  Admitted from: Home Discharged to: Home  Admit date: 01/21/2022 Discharge date: 01/24/2022  Recommendations for Outpatient Follow-up:    Follow-up Information     [place], [facility]. Schedule an appointment as soon as possible for a visit in 1 week(s).   Why: To be seen with repeat labs (CBC, BMP, PT and INR).  Kindly reassess home oxygen requirement during this visit.  Follow-up with your Coumadin clinic regarding management of Coumadin with labs. Contact information: [address] [place] [address] [place] [address] [phone]         Marlane MingleFortin, Terry, MD Follow up in 1 week(s).   Specialty: Internal Medicine Why: Patient was seen by the cardiologists while hospitalized.  They recommend arranging a Heart monitor as outpatient and they have discussed with Dr. Monia PouchFortin to try and arrange it for 01/26/2022.  Please call the office if you do not hear from them in 2-3 business days. Contact information: [address] [facility] [address] [facility] [address] [place] [address] [phone]                  Home Health: None    Equipment/Devices: None.     Discharge Condition: Improved and stable   Code Status: Full Code Diet recommendation:  Discharge Diet Orders (From admission, onward)     Start     Ordered   01/22/22 0000  Diet - low sodium heart healthy        01/22/22 1311             Discharge Diagnoses:  Principal Problem:   CAP (community acquired pneumonia) Active Problems:   Elevated troponin   Acute respiratory failure with hypoxia (HCC)   Atrial fibrillation (HCC)   HTN (hypertension)   Chronic combined systolic and diastolic CHF (congestive heart failure) (HCC)   Pulmonary hypertension (HCC)   VSD (ventricular septal defect)   Pericardial effusion   Diarrhea   Painful swallowing   Brief Summary: 76 year old female with medical history  significant for 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  Cough productive of blood tinged yellowish sputum.  Admitted for suspected community-acquired pneumonia complicated by acute respiratory failure with hypoxia and elevated troponin.  Cardiology consulted.  Patient was about to DC on 3/9 but noted EKG with prolonged QTc >500 and discharge was canceled.  Azithromycin discontinued due to possible interaction with sotalol.  EKG 3/10 with improved QTc, in the 470 ms range.  Patient however with some?  Dysphagia/pain on swallowing, back pain which has since improved on PPI, suspecting due to GERD.  Cardiology reassessed her today, EKG with QTc in the 470 range, they discussed in detail with patient's primary cardiologist and have cleared her for discharge home.  Primary cardiologist will arrange heart monitor on Monday, 01/26/2022.  Assessment and Plan: * CAP (community acquired pneumonia) Initially started on ceftriaxone and azithromycin. Azithromycin discontinued on 3/9 due to interaction with sotalol causing prolonged QTc >500. Briefly had initiated doxycycline, got 1 dose and was discontinued due to concern for esophageal issues i.e. reflux and not wanting to make it worse. Has thus far completed 4 days of IV ceftriaxone, will complete an additional 3 days of cefpodoxime totaling 7  days antibiotics.   Recommend repeating chest x-ray in 4 weeks to ensure resolution of pneumonia findings.  Acute respiratory failure with hypoxia (HCC) Prior to admission, patient chronically on nighttime oxygen 4 L/min continuously. Presented with tachypnea and conversational dyspnea on admission due to pneumonia. On day of discharge today, clinically improved and seen ambulating multiple  times in the hall without dyspnea or discomfort. As per repeat home oxygen needs evaluation, patient saturating at 92% on room air at rest, 85% on room air while ambulating (this reportedly is her baseline) and saturating 90% on 3 L/min oxygen while ambulating. As discussed with Dr. Beatrix Fetters, cardiology, based on chart review, patient has had oxygen saturations in the mid 80s chronically and even on a 6-minute walk test late last year.  He recommends no oxygen in the daytime and continue prior nighttime oxygen.  Elevated troponin Suspect demand ischemia from acute illness Cardiology consultation appreciated.  Troponin peaked around 600. As per cardiology, suspecting demand ischemia in the setting of pneumonia and underlying cardiac conditions.  Recommend continuing aspirin and the rest of her home meds have been resumed. 2D echo 3/9: Large muscular VSD, LVEF 50-55%.  No LV regional wall motion abnormalities.  Grade 2 diastolic dysfunction.  Moderate pericardial effusion, circumferential with no evidence of cardiac tamponade. Troponins down to 78 but EKG with new T wave inversions and ST depressions in anterior lateral leads, cardiology was concerned and reviewed case in detail with patient's primary cardiologist Dr. Monia Pouch on day of discharge and advised that patient could be discharged home with close outpatient follow-up with her primary cardiologist.  Aspirin has been resumed.  No anginal symptoms.  Atrial fibrillation (HCC) On Coumadin.  INR therapeutic at 2.3. Continue prior home dose of Coumadin and outpatient follow-up with her Coumadin clinic for further management. Patient initially presented with palpitations but telemetry has not shown any arrhythmias and continues to show SB in the 50s-SR with occasional PVCs  VSD (ventricular septal defect) Unrepaired large muscular VSD followed by [facility]  Pulmonary hypertension (HCC) Continue Ambrisentan  Chronic combined systolic and diastolic CHF  (congestive heart failure) (HCC) Compensated.  Continue losartan, sotalol.  Patient uses as needed HCTZ and does not take torsemide. TTE results as above.  HTN (hypertension) Continue losartan, HCTZ which she uses as needed. Controlled.  Painful swallowing Reported on 3/10 with pain radiating to the back. Does have history of GERD. Continue PPI, as needed Maalox or Tums, discontinued doxycycline. Improved and almost resolved.  Continue PPI at discharge.  Patient reports that she was on this before and stopped it well until about 4 years ago.  Diarrhea Had 4-5 episodes of diarrhea on 3/9 Prior history of C. difficile  C. difficile testing negative on 3/8. May be related to antibiotics.  Resolved.  Pericardial effusion Moderate pericardial effusion on CT. No tamponade clinically or on 2D echo. Cardiology is aware.   Body mass index is 24.21 kg/m.  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  TTE results as above.  Consultations: Cardiology  Procedures: None   Discharge Instructions  Discharge Instructions     (HEART FAILURE PATIENTS) Call MD:  Anytime you have any of the following symptoms: 1) 3 pound weight gain in 24 hours or 5 pounds in 1 week 2) shortness of breath, with or without a dry hacking cough 3) swelling in the hands, feet or stomach 4) if you have to sleep on extra pillows at night in order to breathe.   Complete by:  As directed    Call MD for:   Complete by: As directed    Recurrent palpitations.   Call MD for:  difficulty breathing, headache or visual disturbances   Complete by: As directed    Call MD for:  extreme fatigue   Complete by: As directed    Call MD for:  persistant dizziness or light-headedness   Complete by: As directed    Call MD for:  persistant nausea and vomiting   Complete by: As directed    Call MD for:  severe uncontrolled pain   Complete by: As directed    Call MD for:   temperature >100.4   Complete by: As directed    Diet - low sodium heart healthy   Complete by: As directed    Increase activity slowly   Complete by: As directed         Medication List     STOP taking these medications    torsemide 10 MG tablet Commonly known as: DEMADEX       TAKE these medications    alum & mag hydroxide-simeth 200-200-20 MG/5ML suspension Commonly known as: MAALOX/MYLANTA Take 30 mLs by mouth every 6 (six) hours as needed for indigestion.   ambrisentan 5 MG tablet Commonly known as: LETAIRIS Take 5 mg by mouth daily.   amLODipine 2.5 MG tablet Commonly known as: NORVASC Take 2.5 mg by mouth daily.   aspirin EC 81 MG tablet Take 1 tablet (81 mg total) by mouth daily.   cefpodoxime 200 MG tablet Commonly known as: VANTIN Take 1 tablet (200 mg total) by mouth 2 (two) times daily for 3 days.   diazepam 2 MG tablet Commonly known as: VALIUM Take 2 mg by mouth every 6 (six) hours as needed for anxiety.   hydrochlorothiazide 12.5 MG capsule Commonly known as: MICROZIDE Take 12.5 mg by mouth as directed. Take 12.5 mg twice a week as directed by your doctor.   losartan 50 MG tablet Commonly known as: COZAAR Take 50 mg by mouth 2 (two) times daily.   pantoprazole 40 MG tablet Commonly known as: PROTONIX Take 1 tablet (40 mg total) by mouth daily. Start taking on: January 25, 2022   sertraline 25 MG tablet Commonly known as: ZOLOFT Take 25 mg by mouth daily.   sodium chloride 0.65 % Soln nasal spray Commonly known as: OCEAN Place 1 spray into both nostrils as needed for congestion.   sotalol 120 MG tablet Commonly known as: BETAPACE Take 120 mg by mouth 2 (two) times daily.   traZODone 50 MG tablet Commonly known as: DESYREL Take 25-50 mg by mouth at bedtime as needed for sleep.   warfarin 1 MG tablet Commonly known as: COUMADIN Take 1-2 mg by mouth as directed. On Tuesday and Thursday take 2 (two) tablets with one 5 mg tablet for  a total dose of 7 mg. On all other days take 1 (one) tablet with one 5 mg tablet for a total dose of 6 mg.   warfarin 5 MG tablet Commonly known as: COUMADIN Take 5 mg by mouth daily.       Allergies  Allergen Reactions   Penicillins Other (See Comments)    Has patient had a PCN reaction causing immediate rash, facial/tongue/throat swelling, SOB or lightheadedness with hypotension: no Has patient had a PCN reaction causing severe rash involving mucus membranes or skin necrosis: no Has patient had a PCN reaction that required hospitalization no Has patient had a PCN reaction occurring  within the last 10 years: no If all of the above answers are "NO", then may proceed with Cephalosporin use.  Fever 105    Sulfa Antibiotics    Tylenol [Acetaminophen]       Procedures/Studies: CT Chest Wo Contrast  Result Date: 01/21/2022 CLINICAL DATA:  Initial evaluation for acute productive cough, congestion, shortness of breath. EXAM: CT CHEST WITHOUT CONTRAST TECHNIQUE: Multidetector CT imaging of the chest was performed following the standard protocol without IV contrast. RADIATION DOSE REDUCTION: This exam was performed according to the departmental dose-optimization program which includes automated exposure control, adjustment of the mA and/or kV according to patient size and/or use of iterative reconstruction technique. COMPARISON:  Prior radiograph from earlier the same day as well as prior chest CT from 02/23/2011. FINDINGS: Cardiovascular: Intrathoracic aorta normal in caliber. Mild atherosclerotic change. Visualized great vessels grossly within normal limits. Prominent cardiomegaly noted. Moderate pericardial effusion measuring simple fluid density noted. Main pulmonary artery dilated up to 3.9 cm, which could reflect changes of underlying pulmonary hypertension. Mediastinum/Nodes: Visualized thyroid within normal limits. No visible enlarged mediastinal or hilar lymph nodes on this noncontrast  examination. No axillary adenopathy. Esophagus within normal limits. Lungs/Pleura: Tracheobronchial tree intact and patent. Mild volume loss within the left lung with associated scattered subsegmental atelectasis within the lingula and left lower lobe. Small area of patchy and nodular densities within the posterior left upper lobe (series 3, images 39, 44), nonspecific, but suspected to reflect a small area of mild infection/pneumonitis given provided history. No other focal infiltrates or consolidative opacity. No pulmonary edema or pleural effusion. No pneumothorax. No other visible pulmonary nodule or mass. Upper Abdomen: Visualized upper abdomen demonstrates no acute finding. 8 mm hyperdensity at the posterior aspect of the visualized left kidney noted, nonspecific, but likely a small proteinaceous and/or hemorrhagic cyst. Musculoskeletal: Visualized external soft tissues demonstrate no acute finding. No acute osseous finding. No discrete or worrisome osseous lesions. Moderate multilevel thoracic spondylosis noted. IMPRESSION: 1. Small area of patchy and nodular densities within the posterior left upper lobe, nonspecific, but suspected to reflect a small area of mild infection/pneumonitis given provided history. 2. Cardiomegaly with moderate pericardial effusion. 3. Dilatation of the main pulmonary artery up to 3.9 cm, which could reflect underlying pulmonary hypertension. 4. No other acute cardiopulmonary abnormality identified. Electronically Signed   By: Rise Mu M.D.   On: 01/21/2022 02:51   DG Chest Portable 1 View  Result Date: 01/21/2022 CLINICAL DATA:  Congestion and productive cough. EXAM: PORTABLE CHEST 1 VIEW COMPARISON:  September 30, 2018 FINDINGS: Mild, stable, diffusely increased lung markings are seen without evidence of focal consolidation, pleural effusion or pneumothorax. There is stable moderate to marked severity cardiomegaly with prominence of the perihilar pulmonary  vasculature. The visualized skeletal structures are unremarkable. IMPRESSION: Stable cardiomegaly with pulmonary vascular congestion. Electronically Signed   By: Aram Candela M.D.   On: 01/21/2022 01:53   ECHOCARDIOGRAM COMPLETE  Result Date: 01/22/2022    ECHOCARDIOGRAM REPORT   Patient Name:   Haley Adams Date of Exam: 01/22/2022 Medical Rec #:  [id number]        Height:       66.0 in Accession #:    [id number]       Weight:       150.0 lb Date of Birth:  1946-11-13        BSA:          1.770 m Patient Age:    76 years  BP:           133/65 mmHg Patient Gender: F                HR:           58 bpm. Exam Location:  [facility] Procedure: 2D Echo, Cardiac Doppler and Color Doppler Indications:     Elevated Troponin  History:         Patient has prior history of Echocardiogram examinations, most                  recent 11/12/2016. CHF, Pulmonary HTN, Arrythmias:Atrial                  Fibrillation; Risk Factors:Dyslipidemia. VSD.  Sonographer:     Cristela Blue Referring Phys:  [id number] Alycia Rossetti MATTHEW ORGEL Diagnosing Phys: Sena Slate IMPRESSIONS  1. Large muscular VSD. LVID 6.2 (prior 6.0 in 01/2021). Left ventricular ejection fraction, by estimation, is 50 to 55%. The left ventricle has low normal function. The left ventricle has no regional wall motion abnormalities. The left ventricular internal cavity size was moderately dilated. There is mild left ventricular hypertrophy. Left ventricular diastolic parameters are consistent with Grade II diastolic dysfunction (pseudonormalization).  2. TR jet inadequate to accurately assess RVSP; likely severely elevated. Right ventricular systolic function is mildly reduced. The right ventricular size is moderately enlarged. Mildly increased right ventricular wall thickness.  3. Left atrial size was severely dilated.  4. Right atrial size was severely dilated.  5. Moderate pericardial effusion. The pericardial effusion is circumferential. There is no evidence of cardiac  tamponade.  6. The mitral valve is degenerative. Mild mitral valve regurgitation. No evidence of mitral stenosis.  7. The tricuspid valve is degenerative. Tricuspid valve regurgitation is moderate.  8. The aortic valve is normal in structure. Aortic valve regurgitation is not visualized. No aortic stenosis is present.  9. The inferior vena cava is normal in size with greater than 50% respiratory variability, suggesting right atrial pressure of 3 mmHg. FINDINGS  Left Ventricle: Large muscular VSD. LVID 6.2 (prior 6.0 in 01/2021). Left ventricular ejection fraction, by estimation, is 50 to 55%. The left ventricle has low normal function. The left ventricle has no regional wall motion abnormalities. The left ventricular internal cavity size was moderately dilated. There is mild left ventricular hypertrophy. Left ventricular diastolic parameters are consistent with Grade II diastolic dysfunction (pseudonormalization). Right Ventricle: TR jet inadequate to accurately assess RVSP; likely severely elevated. The right ventricular size is moderately enlarged. Mildly increased right ventricular wall thickness. Right ventricular systolic function is mildly reduced. Left Atrium: Left atrial size was severely dilated. Right Atrium: Right atrial size was severely dilated. Pericardium: A moderately sized pericardial effusion is present. The pericardial effusion is circumferential. There is no evidence of cardiac tamponade. Mitral Valve: The mitral valve is degenerative in appearance. Mild mitral valve regurgitation. No evidence of mitral valve stenosis. MV peak gradient, 3.8 mmHg. The mean mitral valve gradient is 2.0 mmHg. Tricuspid Valve: The tricuspid valve is degenerative in appearance. Tricuspid valve regurgitation is moderate. Aortic Valve: The aortic valve is normal in structure. Aortic valve regurgitation is not visualized. No aortic stenosis is present. Aortic valve mean gradient measures 2.0 mmHg. Aortic valve peak  gradient measures 3.6 mmHg. Aortic valve area, by VTI measures 3.08 cm. Pulmonic Valve: The pulmonic valve was grossly normal. Pulmonic valve regurgitation is mild. No evidence of pulmonic stenosis. Aorta: The aortic root and ascending aorta are structurally normal, with no evidence  of dilitation. Venous: The inferior vena cava is normal in size with greater than 50% respiratory variability, suggesting right atrial pressure of 3 mmHg. IAS/Shunts: The interatrial septum was not assessed.  LEFT VENTRICLE PLAX 2D LVIDd:         6.20 cm   Diastology LVIDs:         4.00 cm   LV e' medial:    4.46 cm/s LV PW:         0.90 cm   LV E/e' medial:  15.2 LV IVS:        1.30 cm   LV e' lateral:   5.00 cm/s LVOT diam:     2.00 cm   LV E/e' lateral: 13.5 LV SV:         50 LV SV Index:   28 LVOT Area:     3.14 cm  RIGHT VENTRICLE RV Basal diam:  4.90 cm RV S prime:     11.50 cm/s TAPSE (M-mode): 1.8 cm LEFT ATRIUM             Index        RIGHT ATRIUM           Index LA diam:        5.10 cm 2.88 cm/m   RA Area:     21.90 cm LA Vol (A2C):   85.7 ml 48.43 ml/m  RA Volume:   71.60 ml  40.46 ml/m LA Vol (A4C):   87.3 ml 49.33 ml/m LA Biplane Vol: 90.5 ml 51.14 ml/m  AORTIC VALVE                    PULMONIC VALVE AV Area (Vmax):    3.27 cm     PV Vmax:          0.71 m/s AV Area (Vmean):   2.86 cm     PV Vmean:         50.900 cm/s AV Area (VTI):     3.08 cm     PV VTI:           0.147 m AV Vmax:           94.70 cm/s   PV Peak grad:     2.0 mmHg AV Vmean:          59.000 cm/s  PV Mean grad:     1.0 mmHg AV VTI:            0.162 m      PR End Diast Vel: 8.07 msec AV Peak Grad:      3.6 mmHg     RVOT Peak grad:   5 mmHg AV Mean Grad:      2.0 mmHg LVOT Vmax:         98.60 cm/s LVOT Vmean:        53.700 cm/s LVOT VTI:          0.159 m LVOT/AV VTI ratio: 0.98  AORTA Ao Root diam: 2.60 cm MITRAL VALVE               TRICUSPID VALVE MV Area (PHT): 3.21 cm    TR Peak grad:   55.7 mmHg MV Area VTI:   1.93 cm    TR Vmax:        373.00  cm/s MV Peak grad:  3.8 mmHg MV Mean grad:  2.0 mmHg    SHUNTS MV Vmax:       0.98 m/s    Systemic  VTI:  0.16 m MV Vmean:      70.5 cm/s   Systemic Diam: 2.00 cm MV Decel Time: 236 msec    Pulmonic VTI:  0.302 m MV E velocity: 67.70 cm/s MV A velocity: 59.10 cm/s MV E/A ratio:  1.15 Sena Slate Electronically signed by Sena Slate Signature Date/Time: 01/22/2022/10:51:26 AM    Final       Subjective: Overall feels much better.  Painful swallowing and back ache have significantly improved or even resolved.  No further diarrhea.  Denies dyspnea or chest pain.  Eager to return home.  Discharge Exam:  Vitals:   01/24/22 0830 01/24/22 0835 01/24/22 0957 01/24/22 1122  BP:    127/70  Pulse:   70 (!) 58  Resp:    17  Temp:    98 F (36.7 C)  TempSrc:    Oral  SpO2: (!) 86% 93%  93%  Weight:      Height:        General exam: Elderly female, moderately built and nourished seen ambulating independently and comfortably in the halls multiple times this morning without any discomfort or dyspnea. Respiratory system: Clear to auscultation.  No increased work of breathing Cardiovascular system: S1 & S2 heard, RRR. No JVD, murmurs, rubs, gallops or clicks. No pedal edema.  Telemetry personally reviewed: SB in the 50s-SR with occasional PVCs. Gastrointestinal system: Abdomen is nondistended, soft and nontender. No organomegaly or masses felt. Normal bowel sounds heard. Central nervous system: Alert and oriented. No focal neurological deficits. Extremities: Symmetric 5 x 5 power. Skin: No rashes, lesions or ulcers Psychiatry: Judgement and insight appear normal. Mood & affect appropriate.     The results of significant diagnostics from this hospitalization (including imaging, microbiology, ancillary and laboratory) are listed below for reference.     Microbiology: Recent Results (from the past 240 hour(s))  Resp Panel by RT-PCR (Flu A&B, Covid) Nasopharyngeal Swab     Status: None   Collection Time:  01/21/22  1:35 AM   Specimen: Nasopharyngeal Swab; Nasopharyngeal(NP) swabs in vial transport medium  Result Value Ref Range Status   SARS Coronavirus 2 by RT PCR NEGATIVE NEGATIVE Final    Comment: (NOTE) SARS-CoV-2 target nucleic acids are NOT DETECTED.  The SARS-CoV-2 RNA is generally detectable in upper respiratory specimens during the acute phase of infection. The lowest concentration of SARS-CoV-2 viral copies this assay can detect is 138 copies/mL. A negative result does not preclude SARS-Cov-2 infection and should not be used as the sole basis for treatment or other patient management decisions. A negative result may occur with  improper specimen collection/handling, submission of specimen other than nasopharyngeal swab, presence of viral mutation(s) within the areas targeted by this assay, and inadequate number of viral copies(<138 copies/mL). A negative result must be combined with clinical observations, patient history, and epidemiological information. The expected result is Negative.  Fact Sheet for Patients:  BloggerCourse.com  Fact Sheet for Healthcare Providers:  SeriousBroker.it  This test is no t yet approved or cleared by the [place] FDA and  has been authorized for detection and/or diagnosis of SARS-CoV-2 by FDA under an Emergency Use Authorization (EUA). This EUA will remain  in effect (meaning this test can be used) for the duration of the COVID-19 declaration under Section 564(b)(1) of the Act, 21 U.S.C.section 360bbb-3(b)(1), unless the authorization is terminated  or revoked sooner.       Influenza A by PCR NEGATIVE NEGATIVE Final   Influenza B by PCR NEGATIVE  NEGATIVE Final    Comment: (NOTE) The Xpert Xpress SARS-CoV-2/FLU/RSV plus assay is intended as an aid in the diagnosis of influenza from Nasopharyngeal swab specimens and should not be used as a sole basis for treatment. Nasal washings  and aspirates are unacceptable for Xpert Xpress SARS-CoV-2/FLU/RSV testing.  Fact Sheet for Patients: BloggerCourse.com  Fact Sheet for Healthcare Providers: SeriousBroker.it  This test is not yet approved or cleared by the [place] FDA and has been authorized for detection and/or diagnosis of SARS-CoV-2 by FDA under an Emergency Use Authorization (EUA). This EUA will remain in effect (meaning this test can be used) for the duration of the COVID-19 declaration under Section 564(b)(1) of the Act, 21 U.S.C. section 360bbb-3(b)(1), unless the authorization is terminated or revoked.  Performed at [facility], [address] [place], [place] [address]   C Difficile Quick Screen w PCR reflex     Status: None   Collection Time: 01/22/22 10:37 PM   Specimen: STOOL  Result Value Ref Range Status   C Diff antigen NEGATIVE NEGATIVE Final   C Diff toxin NEGATIVE NEGATIVE Final   C Diff interpretation No C. difficile detected.  Final    Comment: Performed at [facility], [address] [place], [place] [address]     Labs: CBC: Recent Labs  Lab 01/21/22 0135 01/22/22 0539  WBC 7.9 6.3  NEUTROABS 5.2  --   HGB 14.2 13.5  HCT 45.2 43.5  MCV 90.4 91.4  PLT 189 169    Basic Metabolic Panel: Recent Labs  Lab 01/21/22 0135 01/21/22 0819 01/22/22 0539 01/22/22 1358 01/23/22 0601  NA 138  --  138  --   --   K 3.9  --  4.1 3.7 4.5  CL 106  --  108  --   --   CO2 22  --  23  --   --   GLUCOSE 118*  --  87  --   --   BUN 15  --  15  --   --   CREATININE 0.58  --  0.60  --   --   CALCIUM 8.9  --  8.8*  --   --   MG  --  1.9 2.0 1.9 2.1    Liver Function Tests: Recent Labs  Lab 01/21/22 0135 01/22/22 1358  AST 22 19  ALT 18 13  ALKPHOS 54 47  BILITOT 0.6 0.6  PROT 7.2 6.3*  ALBUMIN 4.0 3.6     Urinalysis    Component Value Date/Time   COLORURINE STRAW (A) 01/21/2022 0323   APPEARANCEUR  CLEAR (A) 01/21/2022 0323   APPEARANCEUR Clear 02/02/2013 1433   LABSPEC 1.006 01/21/2022 0323   LABSPEC 1.006 02/02/2013 1433   PHURINE 6.0 01/21/2022 0323   GLUCOSEU NEGATIVE 01/21/2022 0323   GLUCOSEU Negative 02/02/2013 1433   HGBUR NEGATIVE 01/21/2022 0323   BILIRUBINUR NEGATIVE 01/21/2022 0323   BILIRUBINUR Negative 02/02/2013 1433   KETONESUR NEGATIVE 01/21/2022 0323   PROTEINUR 30 (A) 01/21/2022 0323   NITRITE NEGATIVE 01/21/2022 0323   LEUKOCYTESUR MODERATE (A) 01/21/2022 0323   LEUKOCYTESUR Negative 02/02/2013 1433    Time coordinating discharge: 40 minutes  SIGNED:  Marcellus Scott, MD,  FACP, [facility], [facility], [facility] (Care Management Physician Certified). [facility]  To contact the attending provider between 7A-7P or the covering provider during after hours 7P-7A, please log into the web site [email or web address] and access using universal [place] password for that web site.  If you do not have the password, please call the hospital operator.

## 2022-01-24 NOTE — Plan of Care (Signed)

## 2022-01-24 NOTE — Progress Notes (Signed)
Haley Adams to be D/C'd Home per MD order.  Discussed prescriptions and follow up appointments with the patient. Prescriptions electronically submitted. medication list explained in detail. Pt verbalized understanding. Personal 02 tank at bedside and sent home with patient ? ?Allergies as of 01/24/2022   ? ?   Reactions  ? Penicillins Other (See Comments)  ? Has patient had a PCN reaction causing immediate rash, facial/tongue/throat swelling, SOB or lightheadedness with hypotension: no ?Has patient had a PCN reaction causing severe rash involving mucus membranes or skin necrosis: no ?Has patient had a PCN reaction that required hospitalization no ?Has patient had a PCN reaction occurring within the last 10 years: no ?If all of the above answers are "NO", then may proceed with Cephalosporin use. ?Fever 105   ? Sulfa Antibiotics   ? Tylenol [acetaminophen]   ? ?  ? ?  ?Medication List  ?  ? ?STOP taking these medications   ? ?torsemide 10 MG tablet ?Commonly known as: DEMADEX ?  ? ?  ? ?TAKE these medications   ? ?alum & mag hydroxide-simeth 200-200-20 MG/5ML suspension ?Commonly known as: MAALOX/MYLANTA ?Take 30 mLs by mouth every 6 (six) hours as needed for indigestion. ?  ?ambrisentan 5 MG tablet ?Commonly known as: LETAIRIS ?Take 5 mg by mouth daily. ?  ?amLODipine 2.5 MG tablet ?Commonly known as: NORVASC ?Take 2.5 mg by mouth daily. ?  ?aspirin EC 81 MG tablet ?Take 1 tablet (81 mg total) by mouth daily. ?  ?cefpodoxime 200 MG tablet ?Commonly known as: VANTIN ?Take 1 tablet (200 mg total) by mouth 2 (two) times daily for 3 days. ?  ?diazepam 2 MG tablet ?Commonly known as: VALIUM ?Take 2 mg by mouth every 6 (six) hours as needed for anxiety. ?  ?hydrochlorothiazide 12.5 MG capsule ?Commonly known as: MICROZIDE ?Take 12.5 mg by mouth as directed. Take 12.5 mg twice a week as directed by your doctor. ?  ?losartan 50 MG tablet ?Commonly known as: COZAAR ?Take 50 mg by mouth 2 (two) times daily. ?   ?pantoprazole 40 MG tablet ?Commonly known as: PROTONIX ?Take 1 tablet (40 mg total) by mouth daily. ?Start taking on: January 25, 2022 ?  ?sertraline 25 MG tablet ?Commonly known as: ZOLOFT ?Take 25 mg by mouth daily. ?  ?sodium chloride 0.65 % Soln nasal spray ?Commonly known as: OCEAN ?Place 1 spray into both nostrils as needed for congestion. ?  ?sotalol 120 MG tablet ?Commonly known as: BETAPACE ?Take 120 mg by mouth 2 (two) times daily. ?  ?traZODone 50 MG tablet ?Commonly known as: DESYREL ?Take 25-50 mg by mouth at bedtime as needed for sleep. ?  ?warfarin 1 MG tablet ?Commonly known as: COUMADIN ?Take 1-2 mg by mouth as directed. On Tuesday and Thursday take 2 (two) tablets with one 5 mg tablet for a total dose of 7 mg. On all other days take 1 (one) tablet with one 5 mg tablet for a total dose of 6 mg. ?  ?warfarin 5 MG tablet ?Commonly known as: COUMADIN ?Take 5 mg by mouth daily. ?  ? ?  ? ? ?Vitals:  ? 01/24/22 0957 01/24/22 1122  ?BP:  127/70  ?Pulse: 70 (!) 58  ?Resp:  17  ?Temp:  98 ?F (36.7 ?C)  ?SpO2:  93%  ? ? ?Skin clean, dry and intact without evidence of skin break down, no evidence of skin tears noted. IV catheter discontinued intact. Site without signs and symptoms of complications. Dressing and pressure  applied. Pt denies pain at this time. No complaints noted. ? ?An After Visit Summary was printed and given to the patient. ?Patient escorted via WC, and D/C home via private auto. ? ?Irine Heminger Marylou Flesher  ?

## 2022-01-24 NOTE — Progress Notes (Signed)
Progress Note ? ?Patient interviewed and examined this morning.  Addendum made to discharge summary from 3/9.  Discussed in detail with patient and with cardiology.  Discharging home today. ? ?Marcellus Scott, MD,  ?FACP, [facility], [facility], [facility] (Care Management Physician Certified) ?[facility] ?[place] ? ?To contact the attending provider between 7A-7P or the covering provider during after hours 7P-7A, please log into the web site [email or web address] and access using universal [place] password for that web site. If you do not have the password, please call the hospital operator. ? ?

## 2022-01-24 NOTE — Progress Notes (Signed)
SATURATION QUALIFICATIONS: (This note is used to comply with regulatory documentation for home oxygen) ? ?Patient Saturations on Room Air at Rest = 92% ? ?Patient Saturations on Room Air while Ambulating = 85% ? ?Patient Saturations on 3 Liters of oxygen while Ambulating = 90% ? ?Please briefly explain why patient needs home oxygen: ambulated around nurses station multiple times, gait steady, no distress no increase work of breathing. Heart rate 60-70's while ambulation. Both dr. Beatrix Fetters and dr. Waymon Amato on the floor to evaluate patient ? ?

## 2023-01-13 ENCOUNTER — Other Ambulatory Visit: Payer: Self-pay

## 2023-01-13 ENCOUNTER — Encounter: Payer: Medicare Other | Attending: Family Medicine

## 2023-01-13 DIAGNOSIS — R0609 Other forms of dyspnea: Secondary | ICD-10-CM

## 2023-01-13 NOTE — Progress Notes (Signed)
Virtual Visit completed. Patient informed on EP and RD appointment and 6 Minute walk test. Patient also informed of patient health questionnaires on My Chart. Patient Verbalizes understanding. Visit diagnosis can be found in [facility] 12/22/2022.

## 2023-01-18 ENCOUNTER — Encounter: Payer: Medicare Other | Attending: Cardiovascular Disease | Admitting: *Deleted

## 2023-01-18 VITALS — Ht 66.2 in | Wt 165.0 lb

## 2023-01-18 DIAGNOSIS — R0609 Other forms of dyspnea: Secondary | ICD-10-CM | POA: Insufficient documentation

## 2023-01-18 DIAGNOSIS — Z79899 Other long term (current) drug therapy: Secondary | ICD-10-CM | POA: Diagnosis not present

## 2023-01-18 DIAGNOSIS — R0602 Shortness of breath: Secondary | ICD-10-CM | POA: Diagnosis not present

## 2023-01-18 DIAGNOSIS — Q249 Congenital malformation of heart, unspecified: Secondary | ICD-10-CM | POA: Insufficient documentation

## 2023-01-18 DIAGNOSIS — I272 Pulmonary hypertension, unspecified: Secondary | ICD-10-CM

## 2023-01-18 DIAGNOSIS — I2729 Other secondary pulmonary hypertension: Secondary | ICD-10-CM | POA: Insufficient documentation

## 2023-01-18 NOTE — Progress Notes (Addendum)
Pulmonary Individual Treatment Plan  Patient Details  Name: Haley Adams MRN: [id number] Date of Birth: [birth year] Referring Provider:   Flowsheet Row Pulmonary Rehab from 01/18/2023 in [facility] Cardiac and Pulmonary Rehab  Referring Provider Alisia Ferrari MD       Initial Encounter Date:  Flowsheet Row Pulmonary Rehab from 01/18/2023 in [facility] Cardiac and Pulmonary Rehab  Date 01/18/23       Visit Diagnosis: DOE (dyspnea on exertion)  Pulmonary HTN ([place])  Patient's Home Medications on Admission:  Current Outpatient Medications:    alum & mag hydroxide-simeth (MAALOX/MYLANTA) 200-200-20 MG/5ML suspension, Take 30 mLs by mouth every 6 (six) hours as needed for indigestion., Disp: 355 mL, Rfl: 0   ambrisentan (LETAIRIS) 5 MG tablet, Take 5 mg by mouth daily. (Patient not taking: Reported on 01/13/2023), Disp: , Rfl:    ambrisentan (LETAIRIS) 5 MG tablet, Take by mouth., Disp: , Rfl:    amLODipine (NORVASC) 2.5 MG tablet, Take 2.5 mg by mouth daily. (Patient not taking: Reported on 01/13/2023), Disp: , Rfl:    amLODipine (NORVASC) 5 MG tablet, Take by mouth., Disp: , Rfl:    ascorbic acid (VITAMIN C) 500 MG tablet, Take by mouth., Disp: , Rfl:    aspirin EC 81 MG tablet, Take 1 tablet (81 mg total) by mouth daily. (Patient not taking: Reported on 01/13/2023), Disp: 30 tablet, Rfl: 1   Cholecalciferol 50 MCG (2000 UT) CAPS, Take by mouth., Disp: , Rfl:    diazepam (VALIUM) 2 MG tablet, Take 2 mg by mouth every 6 (six) hours as needed for anxiety., Disp: , Rfl:    diazepam (VALIUM) 2 MG tablet, Take by mouth. (Patient not taking: Reported on 01/13/2023), Disp: , Rfl:    hydrochlorothiazide (HYDRODIURIL) 12.5 MG tablet, TAKE 1 TABLET(12.5 MG) BY MOUTH EVERY OTHER DAY, Disp: , Rfl:    hydrochlorothiazide (MICROZIDE) 12.5 MG capsule, Take 12.5 mg by mouth as directed. Take 12.5 mg twice a week as directed by your doctor. (Patient not taking: Reported on 01/13/2023), Disp: , Rfl:    losartan  (COZAAR) 50 MG tablet, Take 50 mg by mouth 2 (two) times daily., Disp: , Rfl:    pantoprazole (PROTONIX) 40 MG tablet, Take 1 tablet (40 mg total) by mouth daily. (Patient not taking: Reported on 01/13/2023), Disp: 30 tablet, Rfl: 0   sertraline (ZOLOFT) 25 MG tablet, Take 25 mg by mouth daily. (Patient not taking: Reported on 01/13/2023), Disp: , Rfl:    sertraline (ZOLOFT) 25 MG tablet, Take 1 tablet by mouth daily., Disp: , Rfl:    sodium chloride (OCEAN) 0.65 % SOLN nasal spray, Place 1 spray into both nostrils as needed for congestion., Disp: 1 Bottle, Rfl: 0   sotalol (BETAPACE) 120 MG tablet, Take 120 mg by mouth 2 (two) times daily., Disp: , Rfl:    sotalol (BETAPACE) 120 MG tablet, Take 1 tablet by mouth 2 (two) times daily. (Patient not taking: Reported on 01/13/2023), Disp: , Rfl:    traZODone (DESYREL) 50 MG tablet, Take 25-50 mg by mouth at bedtime as needed for sleep. (Patient not taking: Reported on 01/13/2023), Disp: , Rfl:    warfarin (COUMADIN) 1 MG tablet, Take 1-2 mg by mouth as directed. On Tuesday and Thursday take 2 (two) tablets with one 5 mg tablet for a total dose of 7 mg. On all other days take 1 (one) tablet with one 5 mg tablet for a total dose of 6 mg., Disp: , Rfl:    warfarin (COUMADIN)  1 MG tablet, Take by mouth. (Patient not taking: Reported on 01/13/2023), Disp: , Rfl:    warfarin (COUMADIN) 5 MG tablet, Take 5 mg by mouth daily., Disp: , Rfl:   Past Medical History: Past Medical History:  Diagnosis Date   Anxiety    Atrial fibrillation (HCC)    CHF (congestive heart failure) (HCC)    GERD (gastroesophageal reflux disease)    HLD (hyperlipidemia)    Hypertension    Pulmonary hypertension (HCC)    VSD (ventricular septal defect)     Tobacco Use: Social History   Tobacco Use  Smoking Status Never  Smokeless Tobacco Never    Labs: Review Flowsheet       Latest Ref Rng & Units 07/09/2012 02/03/2013 09/30/2018  Labs for ITP Cardiac and Pulmonary Rehab   Cholestrol 0 - 200 mg/dL 180  226  -  LDL (calc) 0 - 100 mg/dL 111  152  -  HDL-C 40 - 60 mg/dL 48  37  -  Trlycerides 0 - 200 mg/dL 104  183  -  Hemoglobin A1c 4.8 - 5.6 % 6.3  - 6.2      Pulmonary Assessment Scores:  Pulmonary Assessment Scores     Row Name 01/18/23 1449         ADL UCSD   SOB Score total 27     Rest 0     Walk 0     Stairs 3     Bath 0     Dress 0     Shop 2       CAT Score   CAT Score 19       mMRC Score   mMRC Score 1              UCSD: Self-administered rating of dyspnea associated with activities of daily living (ADLs) 6-point scale (0 = "not at all" to 5 = "maximal or unable to do because of breathlessness")  Scoring Scores range from 0 to 120.  Minimally important difference is 5 units  CAT: CAT can identify the health impairment of COPD patients and is better correlated with disease progression.  CAT has a scoring range of zero to 40. The CAT score is classified into four groups of low (less than 10), medium (10 - 20), high (21-30) and very high (31-40) based on the impact level of disease on health status. A CAT score over 10 suggests significant symptoms.  A worsening CAT score could be explained by an exacerbation, poor medication adherence, poor inhaler technique, or progression of COPD or comorbid conditions.  CAT MCID is 2 points  mMRC: mMRC (Modified Medical Research Council) Dyspnea Scale is used to assess the degree of baseline functional disability in patients of respiratory disease due to dyspnea. No minimal important difference is established. A decrease in score of 1 point or greater is considered a positive change.   Pulmonary Function Assessment:  Pulmonary Function Assessment - 01/18/23 1449       Breath   Shortness of Breath Yes;Panic with Shortness of Breath;Limiting activity             Exercise Target Goals: Exercise Program Goal: Individual exercise prescription set using results from initial 6 min walk  test and THRR while considering  patient's activity barriers and safety.   Exercise Prescription Goal: Initial exercise prescription builds to 30-45 minutes a day of aerobic activity, 2-3 days per week.  Home exercise guidelines will be given to patient during program as  part of exercise prescription that the participant will acknowledge.  Education: Aerobic Exercise: - Group verbal and visual presentation on the components of exercise prescription. Introduces F.I.T.T principle from ACSM for exercise prescriptions.  Reviews F.I.T.T. principles of aerobic exercise including progression. Written material given at graduation.   Education: Resistance Exercise: - Group verbal and visual presentation on the components of exercise prescription. Introduces F.I.T.T principle from ACSM for exercise prescriptions  Reviews F.I.T.T. principles of resistance exercise including progression. Written material given at graduation.    Education: Exercise & Equipment Safety: - Individual verbal instruction and demonstration of equipment use and safety with use of the equipment. Flowsheet Row Pulmonary Rehab from 01/18/2023 in [facility] Cardiac and Pulmonary Rehab  Date 01/13/23  Educator [facility]  Instruction Review Code 1- Verbalizes Understanding       Education: Exercise Physiology & General Exercise Guidelines: - Group verbal and written instruction with models to review the exercise physiology of the cardiovascular system and associated critical values. Provides general exercise guidelines with specific guidelines to those with heart or lung disease.    Education: Flexibility, Balance, Mind/Body Relaxation: - Group verbal and visual presentation with interactive activity on the components of exercise prescription. Introduces F.I.T.T principle from ACSM for exercise prescriptions. Reviews F.I.T.T. principles of flexibility and balance exercise training including progression. Also discusses the mind body connection.   Reviews various relaxation techniques to help reduce and manage stress (i.e. Deep breathing, progressive muscle relaxation, and visualization). Balance handout provided to take home. Written material given at graduation.   Activity Barriers & Risk Stratification:  Activity Barriers & Cardiac Risk Stratification - 01/18/23 1440       Activity Barriers & Cardiac Risk Stratification   Activity Barriers Deconditioning;Muscular Weakness;Shortness of Breath             6 Minute Walk:  6 Minute Walk     Row Name 01/18/23 1412         6 Minute Walk   Phase Initial     Distance 400 feet     Walk Time 2.47 minutes  stopped due to desaturation     # of Rest Breaks 0     MPH 1.84     METS 1.04     RPE 7     Perceived Dyspnea  0     VO2 Peak 3.64     Symptoms No  Pt did not feel SOB     Resting HR 60 bpm     Resting BP 126/80     Resting Oxygen Saturation  90 %     Exercise Oxygen Saturation  during 6 min walk 78 %     Max Ex. HR 84 bpm     Max Ex. BP 146/78     2 Minute Post BP 138/70       Interval HR   1 Minute HR 78     2 Minute HR 80     3 Minute HR 84  stopped at 2:28     2 Minute Post HR 66     Interval Heart Rate? Yes       Interval Oxygen   Interval Oxygen? Yes     Baseline Oxygen Saturation % 90 %     1 Minute Oxygen Saturation % 80 %     1 Minute Liters of Oxygen 0 L  Room Air     2 Minute Oxygen Saturation % 79 %     2 Minute Liters of Oxygen 0  L     3 Minute Oxygen Saturation % 78 %  At 2:28     3 Minute Liters of Oxygen 0 L     2 Minute Post Oxygen Saturation % 84 %     2 Minute Post Liters of Oxygen 0 L             Oxygen Initial Assessment:  Oxygen Initial Assessment - 01/18/23 1448       Home Oxygen   Home Oxygen Device Home Concentrator    Sleep Oxygen Prescription Continuous    Liters per minute 4    Home Exercise Oxygen Prescription None    Home Resting Oxygen Prescription None    Compliance with Home Oxygen Use Yes      Initial 6  min Walk   Oxygen Used None      Program Oxygen Prescription   Program Oxygen Prescription Continuous;E-Tanks    Liters per minute 2    Comments will need oxygen to exercise, note sent to doctor to request ongoing oxygen therapy for home and rehab      Intervention   Short Term Goals To learn and exhibit compliance with exercise, home and travel O2 prescription;To learn and understand importance of maintaining oxygen saturations>88%;To learn and understand importance of monitoring SPO2 with pulse oximeter and demonstrate accurate use of the pulse oximeter.;To learn and demonstrate proper pursed lip breathing techniques or other breathing techniques.     Long  Term Goals Exhibits compliance with exercise, home  and travel O2 prescription;Maintenance of O2 saturations>88%;Compliance with respiratory medication;Verbalizes importance of monitoring SPO2 with pulse oximeter and return demonstration;Exhibits proper breathing techniques, such as pursed lip breathing or other method taught during program session             Oxygen Re-Evaluation:   Oxygen Discharge (Final Oxygen Re-Evaluation):   Initial Exercise Prescription:  Initial Exercise Prescription - 01/18/23 1400       Date of Initial Exercise RX and Referring Provider   Date 01/18/23    Referring Provider Alisia Ferrari MD      Oxygen   Oxygen Continuous   will need for exercise, order requested   Liters 2    Maintain Oxygen Saturation 88% or higher      Treadmill   MPH 1.6    Grade 0    Minutes 15    METs 2.23      NuStep   Level 2    SPM 80    Minutes 15    METs 2      REL-XR   Level 1    Speed 50    Minutes 15    METs 2      Track   Laps 20    Minutes 15    METs 2.09      Prescription Details   Frequency (times per week) 2    Duration Progress to 30 minutes of continuous aerobic without signs/symptoms of physical distress      Intensity   THRR 40-80% of Max Heartrate 94-128    Ratings of  Perceived Exertion 11-13    Perceived Dyspnea 0-4      Progression   Progression Continue to progress workloads to maintain intensity without signs/symptoms of physical distress.      Resistance Training   Training Prescription Yes    Weight 3 lb    Reps 10-15             Perform Capillary Blood Glucose checks  as needed.  Exercise Prescription Changes:   Exercise Prescription Changes     Row Name 01/18/23 1400             Response to Exercise   Blood Pressure (Admit) 126/80       Blood Pressure (Exercise) 146/78       Blood Pressure (Exit) 116/64       Heart Rate (Admit) 60 bpm       Heart Rate (Exercise) 84 bpm       Heart Rate (Exit) 61 bpm       Oxygen Saturation (Admit) 90 %       Oxygen Saturation (Exercise) 78 %       Oxygen Saturation (Exit) 89 %       Rating of Perceived Exertion (Exercise) 7       Perceived Dyspnea (Exercise) 0       Symptoms none  stopped due to desaturation by staff       Comments walk test results                Exercise Comments:   Exercise Goals and Review:   Exercise Goals     Row Name 01/18/23 1446             Exercise Goals   Increase Physical Activity Yes       Intervention Provide advice, education, support and counseling about physical activity/exercise needs.;Develop an individualized exercise prescription for aerobic and resistive training based on initial evaluation findings, risk stratification, comorbidities and participant's personal goals.       Expected Outcomes Long Term: Add in home exercise to make exercise part of routine and to increase amount of physical activity.;Short Term: Attend rehab on a regular basis to increase amount of physical activity.;Long Term: Exercising regularly at least 3-5 days a week.       Increase Strength and Stamina Yes       Intervention Provide advice, education, support and counseling about physical activity/exercise needs.;Develop an individualized exercise prescription  for aerobic and resistive training based on initial evaluation findings, risk stratification, comorbidities and participant's personal goals.       Expected Outcomes Short Term: Increase workloads from initial exercise prescription for resistance, speed, and METs.;Long Term: Improve cardiorespiratory fitness, muscular endurance and strength as measured by increased METs and functional capacity (6MWT);Short Term: Perform resistance training exercises routinely during rehab and add in resistance training at home       Able to understand and use rate of perceived exertion (RPE) scale Yes       Intervention Provide education and explanation on how to use RPE scale       Expected Outcomes Short Term: Able to use RPE daily in rehab to express subjective intensity level;Long Term:  Able to use RPE to guide intensity level when exercising independently       Able to understand and use Dyspnea scale Yes       Intervention Provide education and explanation on how to use Dyspnea scale       Expected Outcomes Short Term: Able to use Dyspnea scale daily in rehab to express subjective sense of shortness of breath during exertion;Long Term: Able to use Dyspnea scale to guide intensity level when exercising independently       Knowledge and understanding of Target Heart Rate Range (THRR) Yes       Intervention Provide education and explanation of THRR including how the numbers were predicted and where they are located  for reference       Expected Outcomes Short Term: Able to state/look up THRR;Short Term: Able to use daily as guideline for intensity in rehab;Long Term: Able to use THRR to govern intensity when exercising independently       Able to check pulse independently Yes       Intervention Review the importance of being able to check your own pulse for safety during independent exercise;Provide education and demonstration on how to check pulse in carotid and radial arteries.       Expected Outcomes Short Term:  Able to explain why pulse checking is important during independent exercise;Long Term: Able to check pulse independently and accurately       Understanding of Exercise Prescription Yes       Intervention Provide education, explanation, and written materials on patient's individual exercise prescription       Expected Outcomes Short Term: Able to explain program exercise prescription;Long Term: Able to explain home exercise prescription to exercise independently                Exercise Goals Re-Evaluation :   Discharge Exercise Prescription (Final Exercise Prescription Changes):  Exercise Prescription Changes - 01/18/23 1400       Response to Exercise   Blood Pressure (Admit) 126/80    Blood Pressure (Exercise) 146/78    Blood Pressure (Exit) 116/64    Heart Rate (Admit) 60 bpm    Heart Rate (Exercise) 84 bpm    Heart Rate (Exit) 61 bpm    Oxygen Saturation (Admit) 90 %    Oxygen Saturation (Exercise) 78 %    Oxygen Saturation (Exit) 89 %    Rating of Perceived Exertion (Exercise) 7    Perceived Dyspnea (Exercise) 0    Symptoms none   stopped due to desaturation by staff   Comments walk test results             Nutrition:  Target Goals: Understanding of nutrition guidelines, daily intake of sodium '1500mg'$ , cholesterol '200mg'$ , calories 30% from fat and 7% or less from saturated fats, daily to have 5 or more servings of fruits and vegetables.  Education: All About Nutrition: -Group instruction provided by verbal, written material, interactive activities, discussions, models, and posters to present general guidelines for heart healthy nutrition including fat, fiber, MyPlate, the role of sodium in heart healthy nutrition, utilization of the nutrition label, and utilization of this knowledge for meal planning. Follow up email sent as well. Written material given at graduation. Flowsheet Row Pulmonary Rehab from 01/18/2023 in [facility] Cardiac and Pulmonary Rehab  Education need  identified 01/18/23       Biometrics:  Pre Biometrics - 01/18/23 1446       Pre Biometrics   Height 5' 6.2" (1.681 m)    Weight 165 lb (74.8 kg)    Waist Circumference 35 inches    Hip Circumference 41 inches    Waist to Hip Ratio 0.85 %    BMI (Calculated) 26.49    Single Leg Stand 19.1 seconds              Nutrition Therapy Plan and Nutrition Goals:  Nutrition Therapy & Goals - 01/18/23 1447       Intervention Plan   Intervention Prescribe, educate and counsel regarding individualized specific dietary modifications aiming towards targeted core components such as weight, hypertension, lipid management, diabetes, heart failure and other comorbidities.    Expected Outcomes Short Term Goal: Understand basic principles of dietary content, such  as calories, fat, sodium, cholesterol and nutrients.;Short Term Goal: A plan has been developed with personal nutrition goals set during dietitian appointment.;Long Term Goal: Adherence to prescribed nutrition plan.             Nutrition Assessments:  MEDIFICTS Score Key: ?70 Need to make dietary changes  40-70 Heart Healthy Diet ? 40 Therapeutic Level Cholesterol Diet  Flowsheet Row Pulmonary Rehab from 01/18/2023 in [facility] Cardiac and Pulmonary Rehab  Picture Your Plate Total Score on Admission 55      Picture Your Plate Scores: [id number] Unhealthy dietary pattern with much room for improvement. 41-50 Dietary pattern unlikely to meet recommendations for good health and room for improvement. 51-60 More healthful dietary pattern, with some room for improvement.  >60 Healthy dietary pattern, although there may be some specific behaviors that could be improved.   Nutrition Goals Re-Evaluation:   Nutrition Goals Discharge (Final Nutrition Goals Re-Evaluation):   Psychosocial: Target Goals: Acknowledge presence or absence of significant depression and/or stress, maximize coping skills, provide positive support system. Participant  is able to verbalize types and ability to use techniques and skills needed for reducing stress and depression.   Education: Stress, Anxiety, and Depression - Group verbal and visual presentation to define topics covered.  Reviews how body is impacted by stress, anxiety, and depression.  Also discusses healthy ways to reduce stress and to treat/manage anxiety and depression.  Written material given at graduation.   Education: Sleep Hygiene -Provides group verbal and written instruction about how sleep can affect your health.  Define sleep hygiene, discuss sleep cycles and impact of sleep habits. Review good sleep hygiene tips.    Initial Review & Psychosocial Screening:  Initial Psych Review & Screening - 01/13/23 1017       Initial Review   Current issues with Current Psychotropic Meds;History of Depression;Current Depression;Current Anxiety/Panic;Current Sleep Concerns;Current Stress Concerns    Source of Stress Concerns Chronic Illness    Comments Adriauna has always been anxious and depressed. She worries alot about the family daily that puts her in a depressive mood. She can look to her husband sister and dsaughter for support.      Family Dynamics   Good Support System? Yes      Barriers   Psychosocial barriers to participate in program The patient should benefit from training in stress management and relaxation.      Screening Interventions   Interventions Encouraged to exercise;Program counselor consult;To provide support and resources with identified psychosocial needs;Provide feedback about the scores to participant    Expected Outcomes Short Term goal: Utilizing psychosocial counselor, staff and physician to assist with identification of specific Stressors or current issues interfering with healing process. Setting desired goal for each stressor or current issue identified.;Long Term Goal: Stressors or current issues are controlled or eliminated.;Short Term goal: Identification  and review with participant of any Quality of Life or Depression concerns found by scoring the questionnaire.;Long Term goal: The participant improves quality of Life and PHQ9 Scores as seen by post scores and/or verbalization of changes             Quality of Life Scores:  Scores of 19 and below usually indicate a poorer quality of life in these areas.  A difference of  2-3 points is a clinically meaningful difference.  A difference of 2-3 points in the total score of the Quality of Life Index has been associated with significant improvement in overall quality of life, self-image, physical symptoms, and general  health in studies assessing change in quality of life.  PHQ-9: Review Flowsheet       01/18/2023  Depression screen PHQ 2/9  Decreased Interest 2  Down, Depressed, Hopeless 2  PHQ - 2 Score 4  Altered sleeping 3  Tired, decreased energy 3  Change in appetite 3  Feeling bad or failure about yourself  2  Trouble concentrating 2  Moving slowly or fidgety/restless 2  PHQ-9 Score 19  Difficult doing work/chores Somewhat difficult   Interpretation of Total Score  Total Score Depression Severity:  1-4 = Minimal depression, 5-9 = Mild depression, 10-14 = Moderate depression, 15-19 = Moderately severe depression, 20-27 = Severe depression   Psychosocial Evaluation and Intervention:  Psychosocial Evaluation - 01/13/23 1019       Psychosocial Evaluation & Interventions   Interventions Encouraged to exercise with the program and follow exercise prescription;Relaxation education;Stress management education    Comments Anael has always been anxious and depressed. She worries alot about the family daily that puts her in a depressive mood. She can look to her husband sister and dsaughter for support.    Expected Outcomes Short: Start [facility] to help with mood. Long: Maintain a healthy mental state.    Continue Psychosocial Services  Follow up required by staff              Psychosocial Re-Evaluation:   Psychosocial Discharge (Final Psychosocial Re-Evaluation):   Education: Education Goals: Education classes will be provided on a weekly basis, covering required topics. Participant will state understanding/return demonstration of topics presented.  Learning Barriers/Preferences:  Learning Barriers/Preferences - 01/13/23 1015       Learning Barriers/Preferences   Learning Barriers None    Learning Preferences None             General Pulmonary Education Topics:  Infection Prevention: - Provides verbal and written material to individual with discussion of infection control including proper hand washing and proper equipment cleaning during exercise session. Flowsheet Row Pulmonary Rehab from 01/18/2023 in [facility] Cardiac and Pulmonary Rehab  Date 01/13/23  Educator [facility]  Instruction Review Code 1- Verbalizes Understanding       Falls Prevention: - Provides verbal and written material to individual with discussion of falls prevention and safety. Flowsheet Row Pulmonary Rehab from 01/18/2023 in [facility] Cardiac and Pulmonary Rehab  Date 01/13/23  Educator [facility]  Instruction Review Code 1- Verbalizes Understanding       Chronic Lung Disease Review: - Group verbal instruction with posters, models, PowerPoint presentations and videos,  to review new updates, new respiratory medications, new advancements in procedures and treatments. Providing information on websites and "800" numbers for continued self-education. Includes information about supplement oxygen, available portable oxygen systems, continuous and intermittent flow rates, oxygen safety, concentrators, and Medicare reimbursement for oxygen. Explanation of Pulmonary Drugs, including class, frequency, complications, importance of spacers, rinsing mouth after steroid MDI's, and proper cleaning methods for nebulizers. Review of basic lung anatomy and physiology related to function, structure, and  complications of lung disease. Review of risk factors. Discussion about methods for diagnosing sleep apnea and types of masks and machines for OSA. Includes a review of the use of types of environmental controls: home humidity, furnaces, filters, dust mite/pet prevention, HEPA vacuums. Discussion about weather changes, air quality and the benefits of nasal washing. Instruction on Warning signs, infection symptoms, calling MD promptly, preventive modes, and value of vaccinations. Review of effective airway clearance, coughing and/or vibration techniques. Emphasizing that all should Create an Action Plan.  Written material given at graduation. Flowsheet Row Pulmonary Rehab from 01/18/2023 in [facility] Cardiac and Pulmonary Rehab  Education need identified 01/18/23       AED/CPR: - Group verbal and written instruction with the use of models to demonstrate the basic use of the AED with the basic ABC's of resuscitation.    Anatomy and Cardiac Procedures: - Group verbal and visual presentation and models provide information about basic cardiac anatomy and function. Reviews the testing methods done to diagnose heart disease and the outcomes of the test results. Describes the treatment choices: Medical Management, Angioplasty, or Coronary Bypass Surgery for treating various heart conditions including Myocardial Infarction, Angina, Valve Disease, and Cardiac Arrhythmias.  Written material given at graduation.   Medication Safety: - Group verbal and visual instruction to review commonly prescribed medications for heart and lung disease. Reviews the medication, class of the drug, and side effects. Includes the steps to properly store meds and maintain the prescription regimen.  Written material given at graduation.   Other: -Provides group and verbal instruction on various topics (see comments)   Knowledge Questionnaire Score:  Knowledge Questionnaire Score - 01/18/23 1447       Knowledge Questionnaire  Score   Pre Score 16/18              Core Components/Risk Factors/Patient Goals at Admission:  Personal Goals and Risk Factors at Admission - 01/18/23 1447       Core Components/Risk Factors/Patient Goals on Admission    Weight Management Yes;Weight Loss    Intervention Weight Management: Develop a combined nutrition and exercise program designed to reach desired caloric intake, while maintaining appropriate intake of nutrient and fiber, sodium and fats, and appropriate energy expenditure required for the weight goal.;Weight Management: Provide education and appropriate resources to help participant work on and attain dietary goals.;Weight Management/Obesity: Establish reasonable short term and long term weight goals.    Admit Weight 165 lb (74.8 kg)    Goal Weight: Short Term 160 lb (72.6 kg)    Goal Weight: Long Term 155 lb (70.3 kg)    Expected Outcomes Short Term: Continue to assess and modify interventions until short term weight is achieved;Long Term: Adherence to nutrition and physical activity/exercise program aimed toward attainment of established weight goal;Weight Loss: Understanding of general recommendations for a balanced deficit meal plan, which promotes 1-2 lb weight loss per week and includes a negative energy balance of [phone] kcal/d;Understanding recommendations for meals to include 15-35% energy as protein, 25-35% energy from fat, 35-60% energy from carbohydrates, less than '200mg'$  of dietary cholesterol, 20-35 gm of total fiber daily;Understanding of distribution of calorie intake throughout the day with the consumption of 4-5 meals/snacks    Improve shortness of breath with ADL's Yes    Intervention Provide education, individualized exercise plan and daily activity instruction to help decrease symptoms of SOB with activities of daily living.    Expected Outcomes Short Term: Improve cardiorespiratory fitness to achieve a reduction of symptoms when performing ADLs;Long Term:  Be able to perform more ADLs without symptoms or delay the onset of symptoms    Increase knowledge of respiratory medications and ability to use respiratory devices properly  Yes    Intervention Provide education and demonstration as needed of appropriate use of medications, inhalers, and oxygen therapy.    Expected Outcomes Short Term: Achieves understanding of medications use. Understands that oxygen is a medication prescribed by physician. Demonstrates appropriate use of inhaler and oxygen therapy.;Long Term: Maintain appropriate use of  medications, inhalers, and oxygen therapy.    Hypertension Yes    Intervention Provide education on lifestyle modifcations including regular physical activity/exercise, weight management, moderate sodium restriction and increased consumption of fresh fruit, vegetables, and low fat dairy, alcohol moderation, and smoking cessation.;Monitor prescription use compliance.    Expected Outcomes Short Term: Continued assessment and intervention until BP is < 140/55m HG in hypertensive participants. < 130/837mHG in hypertensive participants with diabetes, heart failure or chronic kidney disease.;Long Term: Maintenance of blood pressure at goal levels.    Lipids Yes    Intervention Provide education and support for participant on nutrition & aerobic/resistive exercise along with prescribed medications to achieve LDL '70mg'$ , HDL >'40mg'$ .    Expected Outcomes Short Term: Participant states understanding of desired cholesterol values and is compliant with medications prescribed. Participant is following exercise prescription and nutrition guidelines.;Long Term: Cholesterol controlled with medications as prescribed, with individualized exercise RX and with personalized nutrition plan. Value goals: LDL < '70mg'$ , HDL > 40 mg.             Education:Diabetes - Individual verbal and written instruction to review signs/symptoms of diabetes, desired ranges of glucose level fasting, after  meals and with exercise. Acknowledge that pre and post exercise glucose checks will be done for 3 sessions at entry of program.   Know Your Numbers and Heart Failure: - Group verbal and visual instruction to discuss disease risk factors for cardiac and pulmonary disease and treatment options.  Reviews associated critical values for Overweight/Obesity, Hypertension, Cholesterol, and Diabetes.  Discusses basics of heart failure: signs/symptoms and treatments.  Introduces Heart Failure Zone chart for action plan for heart failure.  Written material given at graduation.   Core Components/Risk Factors/Patient Goals Review:    Core Components/Risk Factors/Patient Goals at Discharge (Final Review):    ITP Comments:  ITP Comments     Row Name 01/13/23 1013 01/18/23 1411         ITP Comments Virtual Visit completed. Patient informed on EP and RD appointment and 6 Minute walk test. Patient also informed of patient health questionnaires on My Chart. Patient Verbalizes understanding. [facility] Completed 6MWT and gym orientation. Initial ITP created and sent for review to Dr. FaZetta BillsMedical Director.               Comments: Initial ITP

## 2023-01-18 NOTE — Patient Instructions (Signed)
Patient Instructions  Patient Details  Name: Haley Adams MRN: [id number] Date of Birth: Dec 20, 1945 Referring Provider:  Rich Reining, MD  Below are your personal goals for exercise, nutrition, and risk factors. Our goal is to help you stay on track towards obtaining and maintaining these goals. We will be discussing your progress on these goals with you throughout the program.  Initial Exercise Prescription:  Initial Exercise Prescription - 01/18/23 1400       Date of Initial Exercise RX and Referring Provider   Date 01/18/23    Referring Provider Alisia Ferrari MD      Oxygen   Oxygen Continuous   will need for exercise, order requested   Liters 2    Maintain Oxygen Saturation 88% or higher      Treadmill   MPH 1.6    Grade 0    Minutes 15    METs 2.23      NuStep   Level 2    SPM 80    Minutes 15    METs 2      REL-XR   Level 1    Speed 50    Minutes 15    METs 2      Track   Laps 20    Minutes 15    METs 2.09      Prescription Details   Frequency (times per week) 2    Duration Progress to 30 minutes of continuous aerobic without signs/symptoms of physical distress      Intensity   THRR 40-80% of Max Heartrate 94-128    Ratings of Perceived Exertion 11-13    Perceived Dyspnea 0-4      Progression   Progression Continue to progress workloads to maintain intensity without signs/symptoms of physical distress.      Resistance Training   Training Prescription Yes    Weight 3 lb    Reps 10-15             Exercise Goals: Frequency: Be able to perform aerobic exercise two to three times per week in program working toward 2-5 days per week of home exercise.  Intensity: Work with a perceived exertion of 11 (fairly light) - 15 (hard) while following your exercise prescription.  We will make changes to your prescription with you as you progress through the program.   Duration: Be able to do 30 to 45 minutes of continuous aerobic exercise in  addition to a 5 minute warm-up and a 5 minute cool-down routine.   Nutrition Goals: Your personal nutrition goals will be established when you do your nutrition analysis with the dietician.  The following are general nutrition guidelines to follow: Cholesterol < '200mg'$ /day Sodium < '1500mg'$ /day Fiber: Women over 50 yrs - 21 grams per day  Personal Goals:  Personal Goals and Risk Factors at Admission - 01/18/23 1447       Core Components/Risk Factors/Patient Goals on Admission    Weight Management Yes;Weight Loss    Intervention Weight Management: Develop a combined nutrition and exercise program designed to reach desired caloric intake, while maintaining appropriate intake of nutrient and fiber, sodium and fats, and appropriate energy expenditure required for the weight goal.;Weight Management: Provide education and appropriate resources to help participant work on and attain dietary goals.;Weight Management/Obesity: Establish reasonable short term and long term weight goals.    Admit Weight 165 lb (74.8 kg)    Goal Weight: Short Term 160 lb (72.6 kg)    Goal Weight: Long Term 155  lb (70.3 kg)    Expected Outcomes Short Term: Continue to assess and modify interventions until short term weight is achieved;Long Term: Adherence to nutrition and physical activity/exercise program aimed toward attainment of established weight goal;Weight Loss: Understanding of general recommendations for a balanced deficit meal plan, which promotes 1-2 lb weight loss per week and includes a negative energy balance of [phone] kcal/d;Understanding recommendations for meals to include 15-35% energy as protein, 25-35% energy from fat, 35-60% energy from carbohydrates, less than '200mg'$  of dietary cholesterol, 20-35 gm of total fiber daily;Understanding of distribution of calorie intake throughout the day with the consumption of 4-5 meals/snacks    Improve shortness of breath with ADL's Yes    Intervention Provide education,  individualized exercise plan and daily activity instruction to help decrease symptoms of SOB with activities of daily living.    Expected Outcomes Short Term: Improve cardiorespiratory fitness to achieve a reduction of symptoms when performing ADLs;Long Term: Be able to perform more ADLs without symptoms or delay the onset of symptoms    Increase knowledge of respiratory medications and ability to use respiratory devices properly  Yes    Intervention Provide education and demonstration as needed of appropriate use of medications, inhalers, and oxygen therapy.    Expected Outcomes Short Term: Achieves understanding of medications use. Understands that oxygen is a medication prescribed by physician. Demonstrates appropriate use of inhaler and oxygen therapy.;Long Term: Maintain appropriate use of medications, inhalers, and oxygen therapy.    Hypertension Yes    Intervention Provide education on lifestyle modifcations including regular physical activity/exercise, weight management, moderate sodium restriction and increased consumption of fresh fruit, vegetables, and low fat dairy, alcohol moderation, and smoking cessation.;Monitor prescription use compliance.    Expected Outcomes Short Term: Continued assessment and intervention until BP is < 140/60m HG in hypertensive participants. < 130/814mHG in hypertensive participants with diabetes, heart failure or chronic kidney disease.;Long Term: Maintenance of blood pressure at goal levels.    Lipids Yes    Intervention Provide education and support for participant on nutrition & aerobic/resistive exercise along with prescribed medications to achieve LDL '70mg'$ , HDL >'40mg'$ .    Expected Outcomes Short Term: Participant states understanding of desired cholesterol values and is compliant with medications prescribed. Participant is following exercise prescription and nutrition guidelines.;Long Term: Cholesterol controlled with medications as prescribed, with  individualized exercise RX and with personalized nutrition plan. Value goals: LDL < '70mg'$ , HDL > 40 mg.             Tobacco Use Initial Evaluation: Social History   Tobacco Use  Smoking Status Never  Smokeless Tobacco Never    Exercise Goals and Review:  Exercise Goals     Row Name 01/18/23 1446             Exercise Goals   Increase Physical Activity Yes       Intervention Provide advice, education, support and counseling about physical activity/exercise needs.;Develop an individualized exercise prescription for aerobic and resistive training based on initial evaluation findings, risk stratification, comorbidities and participant's personal goals.       Expected Outcomes Long Term: Add in home exercise to make exercise part of routine and to increase amount of physical activity.;Short Term: Attend rehab on a regular basis to increase amount of physical activity.;Long Term: Exercising regularly at least 3-5 days a week.       Increase Strength and Stamina Yes       Intervention Provide advice, education, support and counseling about  physical activity/exercise needs.;Develop an individualized exercise prescription for aerobic and resistive training based on initial evaluation findings, risk stratification, comorbidities and participant's personal goals.       Expected Outcomes Short Term: Increase workloads from initial exercise prescription for resistance, speed, and METs.;Long Term: Improve cardiorespiratory fitness, muscular endurance and strength as measured by increased METs and functional capacity (6MWT);Short Term: Perform resistance training exercises routinely during rehab and add in resistance training at home       Able to understand and use rate of perceived exertion (RPE) scale Yes       Intervention Provide education and explanation on how to use RPE scale       Expected Outcomes Short Term: Able to use RPE daily in rehab to express subjective intensity level;Long Term:   Able to use RPE to guide intensity level when exercising independently       Able to understand and use Dyspnea scale Yes       Intervention Provide education and explanation on how to use Dyspnea scale       Expected Outcomes Short Term: Able to use Dyspnea scale daily in rehab to express subjective sense of shortness of breath during exertion;Long Term: Able to use Dyspnea scale to guide intensity level when exercising independently       Knowledge and understanding of Target Heart Rate Range (THRR) Yes       Intervention Provide education and explanation of THRR including how the numbers were predicted and where they are located for reference       Expected Outcomes Short Term: Able to state/look up THRR;Short Term: Able to use daily as guideline for intensity in rehab;Long Term: Able to use THRR to govern intensity when exercising independently       Able to check pulse independently Yes       Intervention Review the importance of being able to check your own pulse for safety during independent exercise;Provide education and demonstration on how to check pulse in carotid and radial arteries.       Expected Outcomes Short Term: Able to explain why pulse checking is important during independent exercise;Long Term: Able to check pulse independently and accurately       Understanding of Exercise Prescription Yes       Intervention Provide education, explanation, and written materials on patient's individual exercise prescription       Expected Outcomes Short Term: Able to explain program exercise prescription;Long Term: Able to explain home exercise prescription to exercise independently              Copy of goals given to participant.

## 2023-01-18 NOTE — Progress Notes (Signed)
Pt came into today for pulmonary rehab orientation and 6MWT.  We had her stop her 6MWT after 2:28 as she desaturated to 78% on room air while walking.  Pt is set up for oxygen therapy at home for night time use on 4L.    Based on today's, walk test results, we will most likely need to place pt on oxygen for exercise as well.  She would also need portable oxygen for home use to do her home exercise.  Note is being routed to MD for review.  The patient did not feel SOB during walk, which is alarming to [place] given the low readings.  Even her questionnaire score (CAT 19, USCD SOBQ 27, and mMMRC 1) did not show that she was having significant breathing concerns, other than her CAT which was more related to sleep and energy than breathing.  She was concerned that what we got for her blood pressures were significantly lower than she has been getting at home.   Alberteen Sam, MA, RCEP, CCRP 01/18/2023 2:23 PM    01/18/23 1412  6 Minute Walk  Phase Initial  Distance 400 feet  Walk Time 2.47 minutes (stopped due to desaturation)  # of Rest Breaks 0  MPH 1.84  METS 1.04  RPE 7  Perceived Dyspnea  0  VO2 Peak 3.64  Symptoms No (Pt did not feel SOB)  Resting HR 60 bpm  Resting BP 126/80  Resting Oxygen Saturation  90 %  Exercise Oxygen Saturation  during 6 min walk 78 %  Max Ex. HR 84 bpm  Max Ex. BP 146/78  2 Minute Post BP 138/70  Interval HR  Interval Heart Rate? Yes  1 Minute HR 78  2 Minute HR 80  3 Minute HR 84 (stopped at 2:28)  2 Minute Post HR 66  Interval Oxygen  Interval Oxygen? Yes  Baseline Oxygen Saturation % 90 %  1 Minute Oxygen Saturation % 80 %  1 Minute Liters of Oxygen 0 L (Room Air)  2 Minute Oxygen Saturation % 79 %  2 Minute Liters of Oxygen 0 L  3 Minute Oxygen Saturation % 78 % (At 2:28)  3 Minute Liters of Oxygen 0 L  2 Minute Post Oxygen Saturation % 84 %  2 Minute Post Liters of Oxygen 0 L

## 2023-01-20 ENCOUNTER — Encounter: Payer: Medicare Other | Admitting: *Deleted

## 2023-01-20 DIAGNOSIS — R0609 Other forms of dyspnea: Secondary | ICD-10-CM

## 2023-01-20 DIAGNOSIS — I272 Pulmonary hypertension, unspecified: Secondary | ICD-10-CM

## 2023-01-20 NOTE — Progress Notes (Signed)
Daily Session Note  Patient Details  Name: Haley Adams MRN: [id number] Date of Birth: 04-25-1946 Referring Provider:   Flowsheet Row Pulmonary Rehab from 01/18/2023 in [facility] Cardiac and Pulmonary Rehab  Referring Provider Alisia Ferrari MD       Encounter Date: 01/20/2023  Check In:  Session Check In - 01/20/23 1006       Check-In   Supervising physician immediately available to respond to emergencies See telemetry face sheet for immediately available ER MD    Location [facility]-Cardiac & Pulmonary Rehab    Staff Present Darlyne Russian, RN, ADN;Jessica Luan Pulling, MA, RCEP, CCRP, CCET;Noah Tickle, BS, Exercise Physiologist    Virtual Visit No    Medication changes reported     No    Fall or balance concerns reported    No    Warm-up and Cool-down Performed on first and last piece of equipment    Resistance Training Performed Yes    VAD Patient? No    PAD/SET Patient? No      Pain Assessment   Currently in Pain? No/denies                Social History   Tobacco Use  Smoking Status Never  Smokeless Tobacco Never    Goals Met:  Independence with exercise equipment Exercise tolerated well No report of concerns or symptoms today Strength training completed today  Goals Unmet:  Not Applicable  Comments: First full day of exercise!  Patient was oriented to gym and equipment including functions, settings, policies, and procedures.  Patient's individual exercise prescription and treatment plan were reviewed.  All starting workloads were established based on the results of the 6 minute walk test done at initial orientation visit.  The plan for exercise progression was also introduced and progression will be customized based on patient's performance and goals.    Dr. Emily Filbert is Medical Director for [place].  Dr. Ottie Glazier is Medical Director for [facility] Pulmonary Rehabilitation.

## 2023-01-25 ENCOUNTER — Encounter: Payer: Medicare Other | Admitting: *Deleted

## 2023-01-27 ENCOUNTER — Encounter: Payer: Medicare Other | Admitting: *Deleted

## 2023-02-01 ENCOUNTER — Encounter: Payer: Medicare Other | Admitting: *Deleted

## 2023-02-01 DIAGNOSIS — R0609 Other forms of dyspnea: Secondary | ICD-10-CM | POA: Diagnosis not present

## 2023-02-01 NOTE — Progress Notes (Signed)
Daily Session Note  Patient Details  Name: Haley Adams MRN: [id number] Date of Birth: 01-Jun-1946 Referring Provider:   Flowsheet Row Pulmonary Rehab from 01/18/2023 in [facility] Cardiac and Pulmonary Rehab  Referring Provider Alisia Ferrari MD       Encounter Date: 02/01/2023  Check In:  Session Check In - 02/01/23 1120       Check-In   Supervising physician immediately available to respond to emergencies See telemetry face sheet for immediately available ER MD    Location [facility]-Cardiac & Pulmonary Rehab    Staff Present Earlean Shawl, BS, ACSM CEP, Exercise Physiologist;Meredith Sherryll Burger, RN Odelia Gage, RN, Dimple Nanas, BS, Exercise Physiologist    Virtual Visit No    Medication changes reported     No    Fall or balance concerns reported    No    Warm-up and Cool-down Performed on first and last piece of equipment    Resistance Training Performed Yes    VAD Patient? No    PAD/SET Patient? No      Pain Assessment   Currently in Pain? No/denies                Social History   Tobacco Use  Smoking Status Never  Smokeless Tobacco Never    Goals Met:  Improved SOB with ADL's Exercise tolerated well No report of concerns or symptoms today Strength training completed today  Goals Unmet:  Not Applicable  Comments: Pt able to follow exercise prescription today without complaint.  Will continue to monitor for progression.    Dr. Emily Filbert is Medical Director for [place].  Dr. Ottie Glazier is Medical Director for [facility] Pulmonary Rehabilitation.

## 2023-02-03 ENCOUNTER — Encounter: Payer: Medicare Other | Admitting: *Deleted

## 2023-02-03 ENCOUNTER — Encounter: Payer: Self-pay | Admitting: *Deleted

## 2023-02-03 DIAGNOSIS — R0609 Other forms of dyspnea: Secondary | ICD-10-CM

## 2023-02-03 NOTE — Progress Notes (Signed)
Daily Session Note  Patient Details  Name: Haley Adams MRN: [id number] Date of Birth: [birth year] Referring Provider:   Flowsheet Row Pulmonary Rehab from 01/18/2023 in [facility]  Referring Provider Alisia Ferrari MD       Encounter Date: 02/03/2023  Check In:  Session Check In - 02/03/23 0951       Check-In   Supervising physician immediately available to respond to emergencies See telemetry face sheet for immediately available ER MD    Location [facility]-Cardiac & Pulmonary Rehab    Staff Present Darlyne Russian, RN, ADN;Joseph Tessie Fass, RCP,RRT,BSRT;Noah Tickle, BS, Exercise Physiologist    Virtual Visit No    Medication changes reported     No    Fall or balance concerns reported    No    Warm-up and Cool-down Performed on first and last piece of equipment    Resistance Training Performed Yes    VAD Patient? No    PAD/SET Patient? No      Pain Assessment   Currently in Pain? No/denies                Social History   Tobacco Use  Smoking Status Never  Smokeless Tobacco Never    Goals Met:  Independence with exercise equipment Exercise tolerated well No report of concerns or symptoms today Strength training completed today  Goals Unmet:  Not Applicable  Comments: Pt able to follow exercise prescription today without complaint.  Will continue to monitor for progression.    Dr. Emily Filbert is Medical Director for [place].  Dr. Ottie Glazier is Medical Director for [facility] Pulmonary Rehabilitation.

## 2023-02-03 NOTE — Progress Notes (Signed)
Pulmonary Individual Treatment Plan  Patient Details  Name: Kendraya Holaday MRN: [id number] Date of Birth: 05-Dec-1945 Referring Provider:   Flowsheet Row Pulmonary Rehab from 01/18/2023 in [facility] Cardiac and Pulmonary Rehab  Referring Provider Alisia Ferrari MD       Initial Encounter Date:  Flowsheet Row Pulmonary Rehab from 01/18/2023 in [facility] Cardiac and Pulmonary Rehab  Date 01/18/23       Visit Diagnosis: DOE (dyspnea on exertion)  Patient's Home Medications on Admission:  Current Outpatient Medications:    alum & mag hydroxide-simeth (MAALOX/MYLANTA) 200-200-20 MG/5ML suspension, Take 30 mLs by mouth every 6 (six) hours as needed for indigestion., Disp: 355 mL, Rfl: 0   ambrisentan (LETAIRIS) 5 MG tablet, Take 5 mg by mouth daily. (Patient not taking: Reported on 01/13/2023), Disp: , Rfl:    ambrisentan (LETAIRIS) 5 MG tablet, Take by mouth., Disp: , Rfl:    amLODipine (NORVASC) 2.5 MG tablet, Take 2.5 mg by mouth daily. (Patient not taking: Reported on 01/13/2023), Disp: , Rfl:    amLODipine (NORVASC) 5 MG tablet, Take by mouth., Disp: , Rfl:    ascorbic acid (VITAMIN C) 500 MG tablet, Take by mouth., Disp: , Rfl:    aspirin EC 81 MG tablet, Take 1 tablet (81 mg total) by mouth daily. (Patient not taking: Reported on 01/13/2023), Disp: 30 tablet, Rfl: 1   Cholecalciferol 50 MCG (2000 UT) CAPS, Take by mouth., Disp: , Rfl:    diazepam (VALIUM) 2 MG tablet, Take 2 mg by mouth every 6 (six) hours as needed for anxiety., Disp: , Rfl:    diazepam (VALIUM) 2 MG tablet, Take by mouth. (Patient not taking: Reported on 01/13/2023), Disp: , Rfl:    hydrochlorothiazide (HYDRODIURIL) 12.5 MG tablet, TAKE 1 TABLET(12.5 MG) BY MOUTH EVERY OTHER DAY, Disp: , Rfl:    hydrochlorothiazide (MICROZIDE) 12.5 MG capsule, Take 12.5 mg by mouth as directed. Take 12.5 mg twice a week as directed by your doctor. (Patient not taking: Reported on 01/13/2023), Disp: , Rfl:    losartan (COZAAR) 50 MG tablet,  Take 50 mg by mouth 2 (two) times daily., Disp: , Rfl:    pantoprazole (PROTONIX) 40 MG tablet, Take 1 tablet (40 mg total) by mouth daily. (Patient not taking: Reported on 01/13/2023), Disp: 30 tablet, Rfl: 0   sertraline (ZOLOFT) 25 MG tablet, Take 25 mg by mouth daily. (Patient not taking: Reported on 01/13/2023), Disp: , Rfl:    sertraline (ZOLOFT) 25 MG tablet, Take 1 tablet by mouth daily., Disp: , Rfl:    sodium chloride (OCEAN) 0.65 % SOLN nasal spray, Place 1 spray into both nostrils as needed for congestion., Disp: 1 Bottle, Rfl: 0   sotalol (BETAPACE) 120 MG tablet, Take 120 mg by mouth 2 (two) times daily., Disp: , Rfl:    sotalol (BETAPACE) 120 MG tablet, Take 1 tablet by mouth 2 (two) times daily. (Patient not taking: Reported on 01/13/2023), Disp: , Rfl:    traZODone (DESYREL) 50 MG tablet, Take 25-50 mg by mouth at bedtime as needed for sleep. (Patient not taking: Reported on 01/13/2023), Disp: , Rfl:    warfarin (COUMADIN) 1 MG tablet, Take 1-2 mg by mouth as directed. On Tuesday and Thursday take 2 (two) tablets with one 5 mg tablet for a total dose of 7 mg. On all other days take 1 (one) tablet with one 5 mg tablet for a total dose of 6 mg., Disp: , Rfl:    warfarin (COUMADIN) 1 MG tablet, Take  by mouth. (Patient not taking: Reported on 01/13/2023), Disp: , Rfl:    warfarin (COUMADIN) 5 MG tablet, Take 5 mg by mouth daily., Disp: , Rfl:   Past Medical History: Past Medical History:  Diagnosis Date   Anxiety    Atrial fibrillation (HCC)    CHF (congestive heart failure) (HCC)    GERD (gastroesophageal reflux disease)    HLD (hyperlipidemia)    Hypertension    Pulmonary hypertension (HCC)    VSD (ventricular septal defect)     Tobacco Use: Social History   Tobacco Use  Smoking Status Never  Smokeless Tobacco Never    Labs: Review Flowsheet       Latest Ref Rng & Units 07/09/2012 02/03/2013 09/30/2018  Labs for ITP Cardiac and Pulmonary Rehab  Cholestrol 0 - 200 mg/dL  180  226  -  LDL (calc) 0 - 100 mg/dL 111  152  -  HDL-C 40 - 60 mg/dL 48  37  -  Trlycerides 0 - 200 mg/dL 104  183  -  Hemoglobin A1c 4.8 - 5.6 % 6.3  - 6.2      Pulmonary Assessment Scores:  Pulmonary Assessment Scores     Row Name 01/18/23 1449         ADL UCSD   SOB Score total 27     Rest 0     Walk 0     Stairs 3     Bath 0     Dress 0     Shop 2       CAT Score   CAT Score 19       mMRC Score   mMRC Score 1              UCSD: Self-administered rating of dyspnea associated with activities of daily living (ADLs) 6-point scale (0 = "not at all" to 5 = "maximal or unable to do because of breathlessness")  Scoring Scores range from 0 to 120.  Minimally important difference is 5 units  CAT: CAT can identify the health impairment of COPD patients and is better correlated with disease progression.  CAT has a scoring range of zero to 40. The CAT score is classified into four groups of low (less than 10), medium (10 - 20), high (21-30) and very high (31-40) based on the impact level of disease on health status. A CAT score over 10 suggests significant symptoms.  A worsening CAT score could be explained by an exacerbation, poor medication adherence, poor inhaler technique, or progression of COPD or comorbid conditions.  CAT MCID is 2 points  mMRC: mMRC (Modified Medical Research Council) Dyspnea Scale is used to assess the degree of baseline functional disability in patients of respiratory disease due to dyspnea. No minimal important difference is established. A decrease in score of 1 point or greater is considered a positive change.   Pulmonary Function Assessment:  Pulmonary Function Assessment - 01/18/23 1449       Breath   Shortness of Breath Yes;Panic with Shortness of Breath;Limiting activity             Exercise Target Goals: Exercise Program Goal: Individual exercise prescription set using results from initial 6 min walk test and THRR while  considering  patient's activity barriers and safety.   Exercise Prescription Goal: Initial exercise prescription builds to 30-45 minutes a day of aerobic activity, 2-3 days per week.  Home exercise guidelines will be given to patient during program as part of exercise prescription  that the participant will acknowledge.  Education: Aerobic Exercise: - Group verbal and visual presentation on the components of exercise prescription. Introduces F.I.T.T principle from ACSM for exercise prescriptions.  Reviews F.I.T.T. principles of aerobic exercise including progression. Written material given at graduation.   Education: Resistance Exercise: - Group verbal and visual presentation on the components of exercise prescription. Introduces F.I.T.T principle from ACSM for exercise prescriptions  Reviews F.I.T.T. principles of resistance exercise including progression. Written material given at graduation.    Education: Exercise & Equipment Safety: - Individual verbal instruction and demonstration of equipment use and safety with use of the equipment. Flowsheet Row Pulmonary Rehab from 01/20/2023 in [facility] Cardiac and Pulmonary Rehab  Date 01/13/23  Educator [facility]  Instruction Review Code 1- Verbalizes Understanding       Education: Exercise Physiology & General Exercise Guidelines: - Group verbal and written instruction with models to review the exercise physiology of the cardiovascular system and associated critical values. Provides general exercise guidelines with specific guidelines to those with heart or lung disease.    Education: Flexibility, Balance, Mind/Body Relaxation: - Group verbal and visual presentation with interactive activity on the components of exercise prescription. Introduces F.I.T.T principle from ACSM for exercise prescriptions. Reviews F.I.T.T. principles of flexibility and balance exercise training including progression. Also discusses the mind body connection.  Reviews various  relaxation techniques to help reduce and manage stress (i.e. Deep breathing, progressive muscle relaxation, and visualization). Balance handout provided to take home. Written material given at graduation.   Activity Barriers & Risk Stratification:  Activity Barriers & Cardiac Risk Stratification - 01/18/23 1440       Activity Barriers & Cardiac Risk Stratification   Activity Barriers Deconditioning;Muscular Weakness;Shortness of Breath             6 Minute Walk:  6 Minute Walk     Row Name 01/18/23 1412         6 Minute Walk   Phase Initial     Distance 400 feet     Walk Time 2.47 minutes  stopped due to desaturation     # of Rest Breaks 0     MPH 1.84     METS 1.04     RPE 7     Perceived Dyspnea  0     VO2 Peak 3.64     Symptoms No  Pt did not feel SOB     Resting HR 60 bpm     Resting BP 126/80     Resting Oxygen Saturation  90 %     Exercise Oxygen Saturation  during 6 min walk 78 %     Max Ex. HR 84 bpm     Max Ex. BP 146/78     2 Minute Post BP 138/70       Interval HR   1 Minute HR 78     2 Minute HR 80     3 Minute HR 84  stopped at 2:28     2 Minute Post HR 66     Interval Heart Rate? Yes       Interval Oxygen   Interval Oxygen? Yes     Baseline Oxygen Saturation % 90 %     1 Minute Oxygen Saturation % 80 %     1 Minute Liters of Oxygen 0 L  Room Air     2 Minute Oxygen Saturation % 79 %     2 Minute Liters of Oxygen 0 L  3 Minute Oxygen Saturation % 78 %  At 2:28     3 Minute Liters of Oxygen 0 L     2 Minute Post Oxygen Saturation % 84 %     2 Minute Post Liters of Oxygen 0 L             Oxygen Initial Assessment:  Oxygen Initial Assessment - 01/18/23 1448       Home Oxygen   Home Oxygen Device Home Concentrator    Sleep Oxygen Prescription Continuous    Liters per minute 4    Home Exercise Oxygen Prescription None    Home Resting Oxygen Prescription None    Compliance with Home Oxygen Use Yes      Initial 6 min Walk    Oxygen Used None      Program Oxygen Prescription   Program Oxygen Prescription Continuous;E-Tanks    Liters per minute 2    Comments will need oxygen to exercise, note sent to doctor to request ongoing oxygen therapy for home and rehab      Intervention   Short Term Goals To learn and exhibit compliance with exercise, home and travel O2 prescription;To learn and understand importance of maintaining oxygen saturations>88%;To learn and understand importance of monitoring SPO2 with pulse oximeter and demonstrate accurate use of the pulse oximeter.;To learn and demonstrate proper pursed lip breathing techniques or other breathing techniques.     Long  Term Goals Exhibits compliance with exercise, home  and travel O2 prescription;Maintenance of O2 saturations>88%;Compliance with respiratory medication;Verbalizes importance of monitoring SPO2 with pulse oximeter and return demonstration;Exhibits proper breathing techniques, such as pursed lip breathing or other method taught during program session             Oxygen Re-Evaluation:  Oxygen Re-Evaluation     Row Name 01/20/23 1016             Goals/Expected Outcomes   Comments Reviewed PLB technique with pt.  Talked about how it works and it's importance in maintaining their exercise saturations.       Goals/Expected Outcomes Short: Become more profiecient at using PLB. Long: Become independent at using PLB.                Oxygen Discharge (Final Oxygen Re-Evaluation):  Oxygen Re-Evaluation - 01/20/23 1016       Goals/Expected Outcomes   Comments Reviewed PLB technique with pt.  Talked about how it works and it's importance in maintaining their exercise saturations.    Goals/Expected Outcomes Short: Become more profiecient at using PLB. Long: Become independent at using PLB.             Initial Exercise Prescription:  Initial Exercise Prescription - 01/18/23 1400       Date of Initial Exercise RX and Referring Provider    Date 01/18/23    Referring Provider Alisia Ferrari MD      Oxygen   Oxygen Continuous   will need for exercise, order requested   Liters 2    Maintain Oxygen Saturation 88% or higher      Treadmill   MPH 1.6    Grade 0    Minutes 15    METs 2.23      NuStep   Level 2    SPM 80    Minutes 15    METs 2      REL-XR   Level 1    Speed 50    Minutes 15    METs  2      Track   Laps 20    Minutes 15    METs 2.09      Prescription Details   Frequency (times per week) 2    Duration Progress to 30 minutes of continuous aerobic without signs/symptoms of physical distress      Intensity   THRR 40-80% of Max Heartrate 94-128    Ratings of Perceived Exertion 11-13    Perceived Dyspnea 0-4      Progression   Progression Continue to progress workloads to maintain intensity without signs/symptoms of physical distress.      Resistance Training   Training Prescription Yes    Weight 3 lb    Reps 10-15             Perform Capillary Blood Glucose checks as needed.  Exercise Prescription Changes:   Exercise Prescription Changes     Row Name 01/18/23 1400             Response to Exercise   Blood Pressure (Admit) 126/80       Blood Pressure (Exercise) 146/78       Blood Pressure (Exit) 116/64       Heart Rate (Admit) 60 bpm       Heart Rate (Exercise) 84 bpm       Heart Rate (Exit) 61 bpm       Oxygen Saturation (Admit) 90 %       Oxygen Saturation (Exercise) 78 %       Oxygen Saturation (Exit) 89 %       Rating of Perceived Exertion (Exercise) 7       Perceived Dyspnea (Exercise) 0       Symptoms none  stopped due to desaturation by staff       Comments walk test results                Exercise Comments:   Exercise Comments     Row Name 01/20/23 1014           Exercise Comments First full day of exercise!  Patient was oriented to gym and equipment including functions, settings, policies, and procedures.  Patient's individual exercise  prescription and treatment plan were reviewed.  All starting workloads were established based on the results of the 6 minute walk test done at initial orientation visit.  The plan for exercise progression was also introduced and progression will be customized based on patient's performance and goals.                Exercise Goals and Review:   Exercise Goals     Row Name 01/18/23 1446             Exercise Goals   Increase Physical Activity Yes       Intervention Provide advice, education, support and counseling about physical activity/exercise needs.;Develop an individualized exercise prescription for aerobic and resistive training based on initial evaluation findings, risk stratification, comorbidities and participant's personal goals.       Expected Outcomes Long Term: Add in home exercise to make exercise part of routine and to increase amount of physical activity.;Short Term: Attend rehab on a regular basis to increase amount of physical activity.;Long Term: Exercising regularly at least 3-5 days a week.       Increase Strength and Stamina Yes       Intervention Provide advice, education, support and counseling about physical activity/exercise needs.;Develop an individualized exercise prescription for aerobic and resistive training  based on initial evaluation findings, risk stratification, comorbidities and participant's personal goals.       Expected Outcomes Short Term: Increase workloads from initial exercise prescription for resistance, speed, and METs.;Long Term: Improve cardiorespiratory fitness, muscular endurance and strength as measured by increased METs and functional capacity (6MWT);Short Term: Perform resistance training exercises routinely during rehab and add in resistance training at home       Able to understand and use rate of perceived exertion (RPE) scale Yes       Intervention Provide education and explanation on how to use RPE scale       Expected Outcomes Short  Term: Able to use RPE daily in rehab to express subjective intensity level;Long Term:  Able to use RPE to guide intensity level when exercising independently       Able to understand and use Dyspnea scale Yes       Intervention Provide education and explanation on how to use Dyspnea scale       Expected Outcomes Short Term: Able to use Dyspnea scale daily in rehab to express subjective sense of shortness of breath during exertion;Long Term: Able to use Dyspnea scale to guide intensity level when exercising independently       Knowledge and understanding of Target Heart Rate Range (THRR) Yes       Intervention Provide education and explanation of THRR including how the numbers were predicted and where they are located for reference       Expected Outcomes Short Term: Able to state/look up THRR;Short Term: Able to use daily as guideline for intensity in rehab;Long Term: Able to use THRR to govern intensity when exercising independently       Able to check pulse independently Yes       Intervention Review the importance of being able to check your own pulse for safety during independent exercise;Provide education and demonstration on how to check pulse in carotid and radial arteries.       Expected Outcomes Short Term: Able to explain why pulse checking is important during independent exercise;Long Term: Able to check pulse independently and accurately       Understanding of Exercise Prescription Yes       Intervention Provide education, explanation, and written materials on patient's individual exercise prescription       Expected Outcomes Short Term: Able to explain program exercise prescription;Long Term: Able to explain home exercise prescription to exercise independently                Exercise Goals Re-Evaluation :  Exercise Goals Re-Evaluation     Row Name 01/20/23 1014             Exercise Goal Re-Evaluation   Exercise Goals Review Able to understand and use rate of perceived  exertion (RPE) scale;Able to understand and use Dyspnea scale;Knowledge and understanding of Target Heart Rate Range (THRR);Understanding of Exercise Prescription       Comments Reviewed RPE scale, THR and program prescription with pt today.  Pt voiced understanding and was given a copy of goals to take home.       Expected Outcomes Short: Use RPE daily to regulate intensity. Long: Follow program prescription in THR.                Discharge Exercise Prescription (Final Exercise Prescription Changes):  Exercise Prescription Changes - 01/18/23 1400       Response to Exercise   Blood Pressure (Admit) 126/80    Blood Pressure (  Exercise) 146/78    Blood Pressure (Exit) 116/64    Heart Rate (Admit) 60 bpm    Heart Rate (Exercise) 84 bpm    Heart Rate (Exit) 61 bpm    Oxygen Saturation (Admit) 90 %    Oxygen Saturation (Exercise) 78 %    Oxygen Saturation (Exit) 89 %    Rating of Perceived Exertion (Exercise) 7    Perceived Dyspnea (Exercise) 0    Symptoms none   stopped due to desaturation by staff   Comments walk test results             Nutrition:  Target Goals: Understanding of nutrition guidelines, daily intake of sodium 1500mg , cholesterol 200mg , calories 30% from fat and 7% or less from saturated fats, daily to have 5 or more servings of fruits and vegetables.  Education: All About Nutrition: -Group instruction provided by verbal, written material, interactive activities, discussions, models, and posters to present general guidelines for heart healthy nutrition including fat, fiber, MyPlate, the role of sodium in heart healthy nutrition, utilization of the nutrition label, and utilization of this knowledge for meal planning. Follow up email sent as well. Written material given at graduation. Flowsheet Row Pulmonary Rehab from 01/20/2023 in [facility] Cardiac and Pulmonary Rehab  Education need identified 01/18/23       Biometrics:  Pre Biometrics - 01/18/23 1446       Pre  Biometrics   Height 5' 6.2" (1.681 m)    Weight 165 lb (74.8 kg)    Waist Circumference 35 inches    Hip Circumference 41 inches    Waist to Hip Ratio 0.85 %    BMI (Calculated) 26.49    Single Leg Stand 19.1 seconds              Nutrition Therapy Plan and Nutrition Goals:  Nutrition Therapy & Goals - 01/18/23 1447       Intervention Plan   Intervention Prescribe, educate and counsel regarding individualized specific dietary modifications aiming towards targeted core components such as weight, hypertension, lipid management, diabetes, heart failure and other comorbidities.    Expected Outcomes Short Term Goal: Understand basic principles of dietary content, such as calories, fat, sodium, cholesterol and nutrients.;Short Term Goal: A plan has been developed with personal nutrition goals set during dietitian appointment.;Long Term Goal: Adherence to prescribed nutrition plan.             Nutrition Assessments:  MEDIFICTS Score Key: ?70 Need to make dietary changes  40-70 Heart Healthy Diet ? 40 Therapeutic Level Cholesterol Diet  Flowsheet Row Pulmonary Rehab from 01/18/2023 in [facility] Cardiac and Pulmonary Rehab  Picture Your Plate Total Score on Admission 55      Picture Your Plate Scores: [id number] Unhealthy dietary pattern with much room for improvement. 41-50 Dietary pattern unlikely to meet recommendations for good health and room for improvement. 51-60 More healthful dietary pattern, with some room for improvement.  >60 Healthy dietary pattern, although there may be some specific behaviors that could be improved.   Nutrition Goals Re-Evaluation:   Nutrition Goals Discharge (Final Nutrition Goals Re-Evaluation):   Psychosocial: Target Goals: Acknowledge presence or absence of significant depression and/or stress, maximize coping skills, provide positive support system. Participant is able to verbalize types and ability to use techniques and skills needed for reducing  stress and depression.   Education: Stress, Anxiety, and Depression - Group verbal and visual presentation to define topics covered.  Reviews how body is impacted by stress, anxiety, and depression.  Also discusses healthy ways to reduce stress and to treat/manage anxiety and depression.  Written material given at graduation.   Education: Sleep Hygiene -Provides group verbal and written instruction about how sleep can affect your health.  Define sleep hygiene, discuss sleep cycles and impact of sleep habits. Review good sleep hygiene tips.    Initial Review & Psychosocial Screening:  Initial Psych Review & Screening - 01/13/23 1017       Initial Review   Current issues with Current Psychotropic Meds;History of Depression;Current Depression;Current Anxiety/Panic;Current Sleep Concerns;Current Stress Concerns    Source of Stress Concerns Chronic Illness    Comments Maxie has always been anxious and depressed. She worries alot about the family daily that puts her in a depressive mood. She can look to her husband sister and dsaughter for support.      Family Dynamics   Good Support System? Yes      Barriers   Psychosocial barriers to participate in program The patient should benefit from training in stress management and relaxation.      Screening Interventions   Interventions Encouraged to exercise;Program counselor consult;To provide support and resources with identified psychosocial needs;Provide feedback about the scores to participant    Expected Outcomes Short Term goal: Utilizing psychosocial counselor, staff and physician to assist with identification of specific Stressors or current issues interfering with healing process. Setting desired goal for each stressor or current issue identified.;Long Term Goal: Stressors or current issues are controlled or eliminated.;Short Term goal: Identification and review with participant of any Quality of Life or Depression concerns found by scoring  the questionnaire.;Long Term goal: The participant improves quality of Life and PHQ9 Scores as seen by post scores and/or verbalization of changes             Quality of Life Scores:  Scores of 19 and below usually indicate a poorer quality of life in these areas.  A difference of  2-3 points is a clinically meaningful difference.  A difference of 2-3 points in the total score of the Quality of Life Index has been associated with significant improvement in overall quality of life, self-image, physical symptoms, and general health in studies assessing change in quality of life.  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 She worries alot about the family daily that puts her in a depressive mood. She can look to her husband sister and dsaughter for support.    Expected Outcomes Short: Start [facility] to help with mood. Long: Maintain a healthy mental state.    Continue Psychosocial Services  Follow up required by staff  Psychosocial Re-Evaluation:   Psychosocial Discharge (Final Psychosocial  Re-Evaluation):   Education: Education Goals: Education classes will be provided on a weekly basis, covering required topics. Participant will state understanding/return demonstration of topics presented.  Learning Barriers/Preferences:  Learning Barriers/Preferences - 01/13/23 1015       Learning Barriers/Preferences   Learning Barriers None    Learning Preferences None             General Pulmonary Education Topics:  Infection Prevention: - Provides verbal and written material to individual with discussion of infection control including proper hand washing and proper equipment cleaning during exercise session. Flowsheet Row Pulmonary Rehab from 01/20/2023 in [facility] Cardiac and Pulmonary Rehab  Date 01/13/23  Educator [facility]  Instruction Review Code 1- Verbalizes Understanding       Falls Prevention: - Provides verbal and written material to individual with discussion of falls prevention and safety. Flowsheet Row Pulmonary Rehab from 01/20/2023 in [facility] Cardiac and Pulmonary Rehab  Date 01/13/23  Educator [facility]  Instruction Review Code 1- Verbalizes Understanding       Chronic Lung Disease Review: - Group verbal instruction with posters, models, PowerPoint presentations and videos,  to review new updates, new respiratory medications, new advancements in procedures and treatments. Providing information on websites and "800" numbers for continued self-education. Includes information about supplement oxygen, available portable oxygen systems, continuous and intermittent flow rates, oxygen safety, concentrators, and Medicare reimbursement for oxygen. Explanation of Pulmonary Drugs, including class, frequency, complications, importance of spacers, rinsing mouth after steroid MDI's, and proper cleaning methods for nebulizers. Review of basic lung anatomy and physiology related to function, structure, and complications of lung disease. Review of risk factors. Discussion about methods for  diagnosing sleep apnea and types of masks and machines for OSA. Includes a review of the use of types of environmental controls: home humidity, furnaces, filters, dust mite/pet prevention, HEPA vacuums. Discussion about weather changes, air quality and the benefits of nasal washing. Instruction on Warning signs, infection symptoms, calling MD promptly, preventive modes, and value of vaccinations. Review of effective airway clearance, coughing and/or vibration techniques. Emphasizing that all should Create an Action Plan. Written material given at graduation. Flowsheet Row Pulmonary Rehab from 01/20/2023 in [facility] Cardiac and Pulmonary Rehab  Education need identified 01/18/23  Date 01/20/23  Educator [facility]  Instruction Review Code 1- Verbalizes Understanding       AED/CPR: - Group verbal and written instruction with the use of models to demonstrate the basic use of the AED with the basic ABC's of resuscitation.    Anatomy and Cardiac Procedures: - Group verbal and visual presentation and models provide information about basic cardiac anatomy and function. Reviews the testing methods done to diagnose heart disease and the outcomes of the test results. Describes the treatment choices: Medical Management, Angioplasty, or Coronary Bypass Surgery for treating various heart conditions including Myocardial Infarction, Angina, Valve Disease, and Cardiac Arrhythmias.  Written material given at graduation.   Medication Safety: - Group verbal and visual instruction to review commonly prescribed medications for heart and lung disease. Reviews the medication, class of the drug, and side effects. Includes the steps to properly store meds and maintain the prescription regimen.  Written material given at graduation.   Other: -Provides group and verbal instruction on various topics (see comments)   Knowledge Questionnaire Score:  Knowledge Questionnaire Score - 01/18/23 1447       Knowledge Questionnaire  Score   Pre Score 16/18  Core Components/Risk Factors/Patient Goals at Admission:  Personal Goals and Risk Factors at Admission - 01/18/23 1447       Core Components/Risk Factors/Patient Goals on Admission    Weight Management Yes;Weight Loss    Intervention Weight Management: Develop a combined nutrition and exercise program designed to reach desired caloric intake, while maintaining appropriate intake of nutrient and fiber, sodium and fats, and appropriate energy expenditure required for the weight goal.;Weight Management: Provide education and appropriate resources to help participant work on and attain dietary goals.;Weight Management/Obesity: Establish reasonable short term and long term weight goals.    Admit Weight 165 lb (74.8 kg)    Goal Weight: Short Term 160 lb (72.6 kg)    Goal Weight: Long Term 155 lb (70.3 kg)    Expected Outcomes Short Term: Continue to assess and modify interventions until short term weight is achieved;Long Term: Adherence to nutrition and physical activity/exercise program aimed toward attainment of established weight goal;Weight Loss: Understanding of general recommendations for a balanced deficit meal plan, which promotes 1-2 lb weight loss per week and includes a negative energy balance of [phone] kcal/d;Understanding recommendations for meals to include 15-35% energy as protein, 25-35% energy from fat, 35-60% energy from carbohydrates, less than 200mg  of dietary cholesterol, 20-35 gm of total fiber daily;Understanding of distribution of calorie intake throughout the day with the consumption of 4-5 meals/snacks    Improve shortness of breath with ADL's Yes    Intervention Provide education, individualized exercise plan and daily activity instruction to help decrease symptoms of SOB with activities of daily living.    Expected Outcomes Short Term: Improve cardiorespiratory fitness to achieve a reduction of symptoms when performing ADLs;Long Term:  Be able to perform more ADLs without symptoms or delay the onset of symptoms    Increase knowledge of respiratory medications and ability to use respiratory devices properly  Yes    Intervention Provide education and demonstration as needed of appropriate use of medications, inhalers, and oxygen therapy.    Expected Outcomes Short Term: Achieves understanding of medications use. Understands that oxygen is a medication prescribed by physician. Demonstrates appropriate use of inhaler and oxygen therapy.;Long Term: Maintain appropriate use of medications, inhalers, and oxygen therapy.    Hypertension Yes    Intervention Provide education on lifestyle modifcations including regular physical activity/exercise, weight management, moderate sodium restriction and increased consumption of fresh fruit, vegetables, and low fat dairy, alcohol moderation, and smoking cessation.;Monitor prescription use compliance.    Expected Outcomes Short Term: Continued assessment and intervention until BP is < 140/25mm HG in hypertensive participants. < 130/13mm HG in hypertensive participants with diabetes, heart failure or chronic kidney disease.;Long Term: Maintenance of blood pressure at goal levels.    Lipids Yes    Intervention Provide education and support for participant on nutrition & aerobic/resistive exercise along with prescribed medications to achieve LDL 70mg , HDL >40mg .    Expected Outcomes Short Term: Participant states understanding of desired cholesterol values and is compliant with medications prescribed. Participant is following exercise prescription and nutrition guidelines.;Long Term: Cholesterol controlled with medications as prescribed, with individualized exercise RX and with personalized nutrition plan. Value goals: LDL < 70mg , HDL > 40 mg.             Education:Diabetes - Individual verbal and written instruction to review signs/symptoms of diabetes, desired ranges of glucose level fasting, after  meals and with exercise. Acknowledge that pre and post exercise glucose checks will be done for 3 sessions at entry of  program.   Know Your Numbers and Heart Failure: - Group verbal and visual instruction to discuss disease risk factors for cardiac and pulmonary disease and treatment options.  Reviews associated critical values for Overweight/Obesity, Hypertension, Cholesterol, and Diabetes.  Discusses basics of heart failure: signs/symptoms and treatments.  Introduces Heart Failure Zone chart for action plan for heart failure.  Written material given at graduation.   Core Components/Risk Factors/Patient Goals Review:    Core Components/Risk Factors/Patient Goals at Discharge (Final Review):    ITP Comments:  ITP Comments     Row Name 01/13/23 1013 01/18/23 1411 01/20/23 1013 02/03/23 0928     ITP Comments Virtual Visit completed. Patient informed on EP and RD appointment and 6 Minute walk test. Patient also informed of patient health questionnaires on My Chart. Patient Verbalizes understanding. Visit diagnosis can be found in [facility] 12/22/[facility]. Completed 6MWT and gym orientation. Initial ITP created and sent for review to Dr. Zetta Bills, Medical Director. First full day of exercise!  Patient was oriented to gym and equipment including functions, settings, policies, and procedures.  Patient's individual exercise prescription and treatment plan were reviewed.  All starting workloads were established based on the results of the 6 minute walk test done at initial orientation visit.  The plan for exercise progression was also introduced and progression will be customized based on patient's performance and goals. 30 Day review completed. Medical Director ITP review done, changes made as directed, and signed approval by Medical Director.    new to program             Comments:

## 2023-02-08 ENCOUNTER — Encounter: Payer: Medicare Other | Admitting: *Deleted

## 2023-02-08 DIAGNOSIS — R0609 Other forms of dyspnea: Secondary | ICD-10-CM | POA: Diagnosis not present

## 2023-02-08 NOTE — Progress Notes (Signed)
Daily Session Note  Patient Details  Name: Haley Adams MRN: [id number] Date of Birth: 12/26/1945 Referring Provider:   Flowsheet Row Pulmonary Rehab from 01/18/2023 in [facility] Cardiac and Pulmonary Rehab  Referring Provider Alisia Ferrari MD       Encounter Date: 02/08/2023  Check In:  Session Check In - 02/08/23 1004       Check-In   Supervising physician immediately available to respond to emergencies See telemetry face sheet for immediately available ER MD    Location [facility]-Cardiac & Pulmonary Rehab    Staff Present Darlyne Russian, RN, Doyce Para, BS, ACSM CEP, Exercise Physiologist;Noah Tickle, BS, Exercise Physiologist    Virtual Visit No    Medication changes reported     No    Fall or balance concerns reported    No    Warm-up and Cool-down Performed on first and last piece of equipment    Resistance Training Performed Yes    VAD Patient? No    PAD/SET Patient? No      Pain Assessment   Currently in Pain? No/denies                Social History   Tobacco Use  Smoking Status Never  Smokeless Tobacco Never    Goals Met:  Independence with exercise equipment Exercise tolerated well No report of concerns or symptoms today Strength training completed today  Goals Unmet:  Not Applicable  Comments: Pt able to follow exercise prescription today without complaint.  Will continue to monitor for progression.    Dr. Emily Filbert is Medical Director for [place].  Dr. Ottie Glazier is Medical Director for [facility] Pulmonary Rehabilitation.

## 2023-02-10 ENCOUNTER — Encounter: Payer: Medicare Other | Admitting: *Deleted

## 2023-02-10 DIAGNOSIS — R0609 Other forms of dyspnea: Secondary | ICD-10-CM

## 2023-02-10 DIAGNOSIS — I272 Pulmonary hypertension, unspecified: Secondary | ICD-10-CM

## 2023-02-10 NOTE — Progress Notes (Signed)
Completed initial RD consultation ?

## 2023-02-10 NOTE — Progress Notes (Signed)
Daily Session Note  Patient Details  Name: Haley Adams MRN: [id number] Date of Birth: 1946/03/07 Referring Provider:   Flowsheet Row Pulmonary Rehab from 01/18/2023 in [facility] Cardiac and Pulmonary Rehab  Referring Provider Alisia Ferrari MD       Encounter Date: 02/10/2023  Check In:  Session Check In - 02/10/23 1002       Check-In   Supervising physician immediately available to respond to emergencies See telemetry face sheet for immediately available ER MD    Location [facility]-Cardiac & Pulmonary Rehab    Staff Present Justin Mend, RCP,RRT,BSRT;Noah Tickle, BS, Exercise Physiologist;Megan Tamala Julian, RN, Diamond Nickel RN, BSN    Virtual Visit No    Medication changes reported     No    Fall or balance concerns reported    No    Tobacco Cessation No Change    Warm-up and Cool-down Performed on first and last piece of equipment    Resistance Training Performed Yes    VAD Patient? No    PAD/SET Patient? No      Pain Assessment   Currently in Pain? No/denies                Social History   Tobacco Use  Smoking Status Never  Smokeless Tobacco Never    Goals Met:  Independence with exercise equipment Exercise tolerated well No report of concerns or symptoms today Strength training completed today  Goals Unmet:  Not Applicable  Comments: Pt able to follow exercise prescription today without complaint.  Will continue to monitor for progression.    Dr. Emily Filbert is Medical Director for [place].  Dr. Ottie Glazier is Medical Director for [facility] Pulmonary Rehabilitation.

## 2023-02-15 ENCOUNTER — Encounter: Payer: Medicare Other | Admitting: *Deleted

## 2023-02-22 ENCOUNTER — Encounter: Payer: Medicare Other | Attending: Cardiovascular Disease | Admitting: *Deleted

## 2023-02-22 DIAGNOSIS — R0609 Other forms of dyspnea: Secondary | ICD-10-CM | POA: Insufficient documentation

## 2023-02-22 NOTE — Progress Notes (Signed)
Daily Session Note  Patient Details  Name: Haley Adams MRN: [id number] Date of Birth: 15-Aug-1946 Referring Provider:   Flowsheet Row Pulmonary Rehab from 01/18/2023 in [facility] Cardiac and Pulmonary Rehab  Referring Provider Mikki Santee MD       Encounter Date: 02/22/2023  Check In:  Session Check In - 02/22/23 0956       Check-In   Location [facility]-Cardiac & Pulmonary Rehab    Staff Present Lanny Hurst, RN, Franki Monte, BS, ACSM CEP, Exercise Physiologist;Noah Tickle, BS, Exercise Physiologist    Virtual Visit No    Medication changes reported     No    Fall or balance concerns reported    No    Warm-up and Cool-down Performed on first and last piece of equipment    Resistance Training Performed Yes    VAD Patient? No    PAD/SET Patient? No      Pain Assessment   Currently in Pain? No/denies                Social History   Tobacco Use  Smoking Status Never  Smokeless Tobacco Never    Goals Met:  Independence with exercise equipment Exercise tolerated well No report of concerns or symptoms today Strength training completed today  Goals Unmet:  Not Applicable  Comments: Pt able to follow exercise prescription today without complaint.  Will continue to monitor for progression.    Dr. Bethann Punches is Medical Director for [facility] Cardiac Rehabilitation.  Dr. Vida Rigger is Medical Director for [facility] Pulmonary Rehabilitation.

## 2023-03-01 ENCOUNTER — Encounter: Payer: Self-pay | Admitting: *Deleted

## 2023-03-01 ENCOUNTER — Encounter: Payer: Medicare Other | Admitting: *Deleted

## 2023-03-01 DIAGNOSIS — I272 Pulmonary hypertension, unspecified: Secondary | ICD-10-CM

## 2023-03-01 DIAGNOSIS — R0609 Other forms of dyspnea: Secondary | ICD-10-CM

## 2023-03-03 ENCOUNTER — Encounter: Payer: Self-pay | Admitting: *Deleted

## 2023-03-03 ENCOUNTER — Encounter: Payer: Medicare Other | Admitting: *Deleted

## 2023-03-03 ENCOUNTER — Telehealth: Payer: Self-pay

## 2023-03-03 DIAGNOSIS — R0609 Other forms of dyspnea: Secondary | ICD-10-CM

## 2023-03-03 DIAGNOSIS — I272 Pulmonary hypertension, unspecified: Secondary | ICD-10-CM

## 2023-03-03 NOTE — Progress Notes (Signed)
Pulmonary Individual Treatment Plan  Patient Details  Name: Haley Adams MRN: [id number] Date of Birth: 1946-06-02 Referring Provider:   Flowsheet Row Pulmonary Rehab from 01/18/2023 in [facility] Cardiac and Pulmonary Rehab  Referring Provider Mikki Santee MD       Initial Encounter Date:  Flowsheet Row Pulmonary Rehab from 01/18/2023 in [facility] Cardiac and Pulmonary Rehab  Date 01/18/23       Visit Diagnosis: DOE (dyspnea on exertion)  Pulmonary HTN  Patient's Home Medications on Admission:  Current Outpatient Medications:    alum & mag hydroxide-simeth (MAALOX/MYLANTA) 200-200-20 MG/5ML suspension, Take 30 mLs by mouth every 6 (six) hours as needed for indigestion., Disp: 355 mL, Rfl: 0   ambrisentan (LETAIRIS) 5 MG tablet, Take 5 mg by mouth daily. (Patient not taking: Reported on 01/13/2023), Disp: , Rfl:    ambrisentan (LETAIRIS) 5 MG tablet, Take by mouth., Disp: , Rfl:    amLODipine (NORVASC) 2.5 MG tablet, Take 2.5 mg by mouth daily. (Patient not taking: Reported on 01/13/2023), Disp: , Rfl:    amLODipine (NORVASC) 5 MG tablet, Take by mouth., Disp: , Rfl:    ascorbic acid (VITAMIN C) 500 MG tablet, Take by mouth., Disp: , Rfl:    aspirin EC 81 MG tablet, Take 1 tablet (81 mg total) by mouth daily. (Patient not taking: Reported on 01/13/2023), Disp: 30 tablet, Rfl: 1   Cholecalciferol 50 MCG (2000 UT) CAPS, Take by mouth., Disp: , Rfl:    diazepam (VALIUM) 2 MG tablet, Take 2 mg by mouth every 6 (six) hours as needed for anxiety., Disp: , Rfl:    diazepam (VALIUM) 2 MG tablet, Take by mouth. (Patient not taking: Reported on 01/13/2023), Disp: , Rfl:    hydrochlorothiazide (HYDRODIURIL) 12.5 MG tablet, TAKE 1 TABLET(12.5 MG) BY MOUTH EVERY OTHER DAY, Disp: , Rfl:    hydrochlorothiazide (MICROZIDE) 12.5 MG capsule, Take 12.5 mg by mouth as directed. Take 12.5 mg twice a week as directed by your doctor. (Patient not taking: Reported on 01/13/2023), Disp: , Rfl:    losartan (COZAAR)  50 MG tablet, Take 50 mg by mouth 2 (two) times daily., Disp: , Rfl:    pantoprazole (PROTONIX) 40 MG tablet, Take 1 tablet (40 mg total) by mouth daily. (Patient not taking: Reported on 01/13/2023), Disp: 30 tablet, Rfl: 0   sertraline (ZOLOFT) 25 MG tablet, Take 25 mg by mouth daily. (Patient not taking: Reported on 01/13/2023), Disp: , Rfl:    sertraline (ZOLOFT) 25 MG tablet, Take 1 tablet by mouth daily., Disp: , Rfl:    sodium chloride (OCEAN) 0.65 % SOLN nasal spray, Place 1 spray into both nostrils as needed for congestion., Disp: 1 Bottle, Rfl: 0   sotalol (BETAPACE) 120 MG tablet, Take 120 mg by mouth 2 (two) times daily., Disp: , Rfl:    sotalol (BETAPACE) 120 MG tablet, Take 1 tablet by mouth 2 (two) times daily. (Patient not taking: Reported on 01/13/2023), Disp: , Rfl:    traZODone (DESYREL) 50 MG tablet, Take 25-50 mg by mouth at bedtime as needed for sleep. (Patient not taking: Reported on 01/13/2023), Disp: , Rfl:    warfarin (COUMADIN) 1 MG tablet, Take 1-2 mg by mouth as directed. On Tuesday and Thursday take 2 (two) tablets with one 5 mg tablet for a total dose of 7 mg. On all other days take 1 (one) tablet with one 5 mg tablet for a total dose of 6 mg., Disp: , Rfl:    warfarin (COUMADIN) 1  MG tablet, Take by mouth. (Patient not taking: Reported on 01/13/2023), Disp: , Rfl:    warfarin (COUMADIN) 5 MG tablet, Take 5 mg by mouth daily., Disp: , Rfl:   Past Medical History: Past Medical History:  Diagnosis Date   Anxiety    Atrial fibrillation (HCC)    CHF (congestive heart failure) (HCC)    GERD (gastroesophageal reflux disease)    HLD (hyperlipidemia)    Hypertension    Pulmonary hypertension (HCC)    VSD (ventricular septal defect)     Tobacco Use: Social History   Tobacco Use  Smoking Status Never  Smokeless Tobacco Never    Labs: Review Flowsheet       Latest Ref Rng & Units 07/09/2012 02/03/2013 09/30/2018  Labs for ITP Cardiac and Pulmonary Rehab  Cholestrol  0 - 200 mg/dL 161  096  -  LDL (calc) 0 - 100 mg/dL 045  409  -  HDL-C 40 - 60 mg/dL 48  37  -  Trlycerides 0 - 200 mg/dL 811  914  -  Hemoglobin A1c 4.8 - 5.6 % 6.3  - 6.2      Pulmonary Assessment Scores:  Pulmonary Assessment Scores     Row Name 01/18/23 1449         ADL UCSD   SOB Score total 27     Rest 0     Walk 0     Stairs 3     Bath 0     Dress 0     Shop 2       CAT Score   CAT Score 19       mMRC Score   mMRC Score 1              UCSD: Self-administered rating of dyspnea associated with activities of daily living (ADLs) 6-point scale (0 = "not at all" to 5 = "maximal or unable to do because of breathlessness")  Scoring Scores range from 0 to 120.  Minimally important difference is 5 units  CAT: CAT can identify the health impairment of COPD patients and is better correlated with disease progression.  CAT has a scoring range of zero to 40. The CAT score is classified into four groups of low (less than 10), medium (10 - 20), high (21-30) and very high (31-40) based on the impact level of disease on health status. A CAT score over 10 suggests significant symptoms.  A worsening CAT score could be explained by an exacerbation, poor medication adherence, poor inhaler technique, or progression of COPD or comorbid conditions.  CAT MCID is 2 points  mMRC: mMRC (Modified Medical Research Council) Dyspnea Scale is used to assess the degree of baseline functional disability in patients of respiratory disease due to dyspnea. No minimal important difference is established. A decrease in score of 1 point or greater is considered a positive change.   Pulmonary Function Assessment:  Pulmonary Function Assessment - 01/18/23 1449       Breath   Shortness of Breath Yes;Panic with Shortness of Breath;Limiting activity             Exercise Target Goals: Exercise Program Goal: Individual exercise prescription set using results from initial 6 min walk test and THRR  while considering  patient's activity barriers and safety.   Exercise Prescription Goal: Initial exercise prescription builds to 30-45 minutes a day of aerobic activity, 2-3 days per week.  Home exercise guidelines will be given to patient during program as part  of exercise prescription that the participant will acknowledge.  Education: Aerobic Exercise: - Group verbal and visual presentation on the components of exercise prescription. Introduces F.I.T.T principle from ACSM for exercise prescriptions.  Reviews F.I.T.T. principles of aerobic exercise including progression. Written material given at graduation.   Education: Resistance Exercise: - Group verbal and visual presentation on the components of exercise prescription. Introduces F.I.T.T principle from ACSM for exercise prescriptions  Reviews F.I.T.T. principles of resistance exercise including progression. Written material given at graduation.    Education: Exercise & Equipment Safety: - Individual verbal instruction and demonstration of equipment use and safety with use of the equipment. Flowsheet Row Pulmonary Rehab from 02/03/2023 in [facility] Cardiac and Pulmonary Rehab  Date 01/13/23  Educator [facility]  Instruction Review Code 1- Verbalizes Understanding       Education: Exercise Physiology & General Exercise Guidelines: - Group verbal and written instruction with models to review the exercise physiology of the cardiovascular system and associated critical values. Provides general exercise guidelines with specific guidelines to those with heart or lung disease.  Flowsheet Row Pulmonary Rehab from 02/03/2023 in [facility] Cardiac and Pulmonary Rehab  Date 02/03/23  Educator [facility]  Instruction Review Code 1- Verbalizes Understanding       Education: Flexibility, Balance, Mind/Body Relaxation: - Group verbal and visual presentation with interactive activity on the components of exercise prescription. Introduces F.I.T.T principle from ACSM for  exercise prescriptions. Reviews F.I.T.T. principles of flexibility and balance exercise training including progression. Also discusses the mind body connection.  Reviews various relaxation techniques to help reduce and manage stress (i.e. Deep breathing, progressive muscle relaxation, and visualization). Balance handout provided to take home. Written material given at graduation.   Activity Barriers & Risk Stratification:  Activity Barriers & Cardiac Risk Stratification - 01/18/23 1440       Activity Barriers & Cardiac Risk Stratification   Activity Barriers Deconditioning;Muscular Weakness;Shortness of Breath             6 Minute Walk:  6 Minute Walk     Row Name 01/18/23 1412         6 Minute Walk   Phase Initial     Distance 400 feet     Walk Time 2.47 minutes  stopped due to desaturation     # of Rest Breaks 0     MPH 1.84     METS 1.04     RPE 7     Perceived Dyspnea  0     VO2 Peak 3.64     Symptoms No  Pt did not feel SOB     Resting HR 60 bpm     Resting BP 126/80     Resting Oxygen Saturation  90 %     Exercise Oxygen Saturation  during 6 min walk 78 %     Max Ex. HR 84 bpm     Max Ex. BP 146/78     2 Minute Post BP 138/70       Interval HR   1 Minute HR 78     2 Minute HR 80     3 Minute HR 84  stopped at 2:28     2 Minute Post HR 66     Interval Heart Rate? Yes       Interval Oxygen   Interval Oxygen? Yes     Baseline Oxygen Saturation % 90 %     1 Minute Oxygen Saturation % 80 %     1 Minute Liters  of Oxygen 0 L  Room Air     2 Minute Oxygen Saturation % 79 %     2 Minute Liters of Oxygen 0 L     3 Minute Oxygen Saturation % 78 %  At 2:28     3 Minute Liters of Oxygen 0 L     2 Minute Post Oxygen Saturation % 84 %     2 Minute Post Liters of Oxygen 0 L             Oxygen Initial Assessment:  Oxygen Initial Assessment - 01/18/23 1448       Home Oxygen   Home Oxygen Device Home Concentrator    Sleep Oxygen Prescription Continuous     Liters per minute 4    Home Exercise Oxygen Prescription None    Home Resting Oxygen Prescription None    Compliance with Home Oxygen Use Yes      Initial 6 min Walk   Oxygen Used None      Program Oxygen Prescription   Program Oxygen Prescription Continuous;E-Tanks    Liters per minute 2    Comments will need oxygen to exercise, note sent to doctor to request ongoing oxygen therapy for home and rehab      Intervention   Short Term Goals To learn and exhibit compliance with exercise, home and travel O2 prescription;To learn and understand importance of maintaining oxygen saturations>88%;To learn and understand importance of monitoring SPO2 with pulse oximeter and demonstrate accurate use of the pulse oximeter.;To learn and demonstrate proper pursed lip breathing techniques or other breathing techniques.     Long  Term Goals Exhibits compliance with exercise, home  and travel O2 prescription;Maintenance of O2 saturations>88%;Compliance with respiratory medication;Verbalizes importance of monitoring SPO2 with pulse oximeter and return demonstration;Exhibits proper breathing techniques, such as pursed lip breathing or other method taught during program session             Oxygen Re-Evaluation:  Oxygen Re-Evaluation     Row Name 01/20/23 1016             Goals/Expected Outcomes   Comments Reviewed PLB technique with pt.  Talked about how it works and it's importance in maintaining their exercise saturations.       Goals/Expected Outcomes Short: Become more profiecient at using PLB. Long: Become independent at using PLB.                Oxygen Discharge (Final Oxygen Re-Evaluation):  Oxygen Re-Evaluation - 01/20/23 1016       Goals/Expected Outcomes   Comments Reviewed PLB technique with pt.  Talked about how it works and it's importance in maintaining their exercise saturations.    Goals/Expected Outcomes Short: Become more profiecient at using PLB. Long: Become independent  at using PLB.             Initial Exercise Prescription:  Initial Exercise Prescription - 01/18/23 1400       Date of Initial Exercise RX and Referring Provider   Date 01/18/23    Referring Provider Mikki Santee MD      Oxygen   Oxygen Continuous   will need for exercise, order requested   Liters 2    Maintain Oxygen Saturation 88% or higher      Treadmill   MPH 1.6    Grade 0    Minutes 15    METs 2.23      NuStep   Level 2    SPM 80  Minutes 15    METs 2      REL-XR   Level 1    Speed 50    Minutes 15    METs 2      Track   Laps 20    Minutes 15    METs 2.09      Prescription Details   Frequency (times per week) 2    Duration Progress to 30 minutes of continuous aerobic without signs/symptoms of physical distress      Intensity   THRR 40-80% of Max Heartrate 94-128    Ratings of Perceived Exertion 11-13    Perceived Dyspnea 0-4      Progression   Progression Continue to progress workloads to maintain intensity without signs/symptoms of physical distress.      Resistance Training   Training Prescription Yes    Weight 3 lb    Reps 10-15             Perform Capillary Blood Glucose checks as needed.  Exercise Prescription Changes:   Exercise Prescription Changes     Row Name 01/18/23 1400 02/04/23 1400 02/18/23 1300         Response to Exercise   Blood Pressure (Admit) 126/80 126/74 110/64     Blood Pressure (Exercise) 146/78 134/72 124/60     Blood Pressure (Exit) 116/64 122/70 112/62     Heart Rate (Admit) 60 bpm 63 bpm 56 bpm     Heart Rate (Exercise) 84 bpm 81 bpm 76 bpm     Heart Rate (Exit) 61 bpm 63 bpm 62 bpm     Oxygen Saturation (Admit) 90 % 90 % 93 %     Oxygen Saturation (Exercise) 78 % 87 % 88 %     Oxygen Saturation (Exit) 89 % 92 % 89 %     Rating of Perceived Exertion (Exercise) Perceived Dyspnea (Exercise) 0 -- 1     Symptoms none  stopped due to desaturation by staff none SOB     Comments walk  test results First full day of exercise --     Duration -- Progress to 30 minutes of  aerobic without signs/symptoms of physical distress Progress to 30 minutes of  aerobic without signs/symptoms of physical distress     Intensity -- THRR unchanged THRR unchanged       Progression   Progression -- Continue to progress workloads to maintain intensity without signs/symptoms of physical distress. Continue to progress workloads to maintain intensity without signs/symptoms of physical distress.     Average METs -- 2.6 2.2       Resistance Training   Training Prescription -- Yes Yes     Weight -- 3 lb 3 lb     Reps -- 10-15 10-15       Interval Training   Interval Training -- No No       Oxygen   Oxygen -- Continuous Continuous     Liters -- 3 4       Treadmill   MPH -- -- 1.6     Grade -- -- 0     Minutes -- -- 15     METs -- -- 2.7       NuStep   Level -- -- 3     Minutes -- -- 15     METs -- -- 2.36       REL-XR   Level -- 1 1     Minutes --  15 15     METs -- 2.6 2.7       Oxygen   Maintain Oxygen Saturation -- 88% or higher 88% or higher              Exercise Comments:   Exercise Comments     Row Name 01/20/23 1014           Exercise Comments First full day of exercise!  Patient was oriented to gym and equipment including functions, settings, policies, and procedures.  Patient's individual exercise prescription and treatment plan were reviewed.  All starting workloads were established based on the results of the 6 minute walk test done at initial orientation visit.  The plan for exercise progression was also introduced and progression will be customized based on patient's performance and goals.                Exercise Goals and Review:   Exercise Goals     Row Name 01/18/23 1446             Exercise Goals   Increase Physical Activity Yes       Intervention Provide advice, education, support and counseling about physical activity/exercise  needs.;Develop an individualized exercise prescription for aerobic and resistive training based on initial evaluation findings, risk stratification, comorbidities and participant's personal goals.       Expected Outcomes Long Term: Add in home exercise to make exercise part of routine and to increase amount of physical activity.;Short Term: Attend rehab on a regular basis to increase amount of physical activity.;Long Term: Exercising regularly at least 3-5 days a week.       Increase Strength and Stamina Yes       Intervention Provide advice, education, support and counseling about physical activity/exercise needs.;Develop an individualized exercise prescription for aerobic and resistive training based on initial evaluation findings, risk stratification, comorbidities and participant's personal goals.       Expected Outcomes Short Term: Increase workloads from initial exercise prescription for resistance, speed, and METs.;Long Term: Improve cardiorespiratory fitness, muscular endurance and strength as measured by increased METs and functional capacity ( );Short Term: Perform resistance training exercises routinely during rehab and add in resistance training at home       Able to understand and use rate of perceived exertion (RPE) scale Yes       Intervention Provide education and explanation on how to use RPE scale       Expected Outcomes Short Term: Able to use RPE daily in rehab to express subjective intensity level;Long Term:  Able to use RPE to guide intensity level when exercising independently       Able to understand and use Dyspnea scale Yes       Intervention Provide education and explanation on how to use Dyspnea scale       Expected Outcomes Short Term: Able to use Dyspnea scale daily in rehab to express subjective sense of shortness of breath during exertion;Long Term: Able to use Dyspnea scale to guide intensity level when exercising independently       Knowledge and understanding of  Target Heart Rate Range (THRR) Yes       Intervention Provide education and explanation of THRR including how the numbers were predicted and where they are located for reference       Expected Outcomes Short Term: Able to state/look up THRR;Short Term: Able to use daily as guideline for intensity in rehab;Long Term: Able to use THRR to govern intensity when exercising  independently       Able to check pulse independently Yes       Intervention Review the importance of being able to check your own pulse for safety during independent exercise;Provide education and demonstration on how to check pulse in carotid and radial arteries.       Expected Outcomes Short Term: Able to explain why pulse checking is important during independent exercise;Long Term: Able to check pulse independently and accurately       Understanding of Exercise Prescription Yes       Intervention Provide education, explanation, and written materials on patient's individual exercise prescription       Expected Outcomes Short Term: Able to explain program exercise prescription;Long Term: Able to explain home exercise prescription to exercise independently                Exercise Goals Re-Evaluation :  Exercise Goals Re-Evaluation     Row Name 01/20/23 1014 02/04/23 1442 02/18/23 1349         Exercise Goal Re-Evaluation   Exercise Goals Review Able to understand and use rate of perceived exertion (RPE) scale;Able to understand and use Dyspnea scale;Knowledge and understanding of Target Heart Rate Range (THRR);Understanding of Exercise Prescription Increase Physical Activity;Increase Strength and Stamina;Understanding of Exercise Prescription Increase Physical Activity;Increase Strength and Stamina;Understanding of Exercise Prescription     Comments Reviewed RPE scale, THR and program prescription with pt today.  Pt voiced understanding and was given a copy of goals to take home. Chong is off to a good start in the program.  She showed up late for her first day so she was only able to do the XR machine for 15 minutes. She did well at level 1 on the XR and tolerated using 3 lb hand weights for resistance training. We will continue to monitor her progress in the program. Rayah continues to do well in rehab. She has been able to increase to level 3 on the T4 Nustep. She is careful with her pace on the track and treadmill as we are making sure she is adhering to stay above 88% oxygen saturation. Her dyspnea ranges around 1 and continues to have appropriate RPEs. Will continue to monitor.     Expected Outcomes Short: Use RPE daily to regulate intensity. Long: Follow program prescription in THR. Short: Conitnue to follow current exercise prescription. Long: Continue to attend cardiac rehab to improve strength and stamina. Short: Slowly build up speed on treadmill when tolerated with O2 saturations Long: Continue to increase overall MET level and stamina              Discharge Exercise Prescription (Final Exercise Prescription Changes):  Exercise Prescription Changes - 02/18/23 1300       Response to Exercise   Blood Pressure (Admit) 110/64    Blood Pressure (Exercise) 124/60    Blood Pressure (Exit) 112/62    Heart Rate (Admit) 56 bpm    Heart Rate (Exercise) 76 bpm    Heart Rate (Exit) 62 bpm    Oxygen Saturation (Admit) 93 %    Oxygen Saturation (Exercise) 88 %    Oxygen Saturation (Exit) 89 %    Rating of Perceived Exertion (Exercise) 13    Perceived Dyspnea (Exercise) 1    Symptoms SOB    Duration Progress to 30 minutes of  aerobic without signs/symptoms of physical distress    Intensity THRR unchanged      Progression   Progression Continue to progress workloads to maintain  intensity without signs/symptoms of physical distress.    Average METs 2.2      Resistance Training   Training Prescription Yes    Weight 3 lb    Reps 10-15      Interval Training   Interval Training No      Oxygen   Oxygen  Continuous    Liters 4      Treadmill   MPH 1.6    Grade 0    Minutes 15    METs 2.7      NuStep   Level 3    Minutes 15    METs 2.36      REL-XR   Level 1    Minutes 15    METs 2.7      Oxygen   Maintain Oxygen Saturation 88% or higher             Nutrition:  Target Goals: Understanding of nutrition guidelines, daily intake of sodium 1500mg , cholesterol 200mg , calories 30% from fat and 7% or less from saturated fats, daily to have 5 or more servings of fruits and vegetables.  Education: All About Nutrition: -Group instruction provided by verbal, written material, interactive activities, discussions, models, and posters to present general guidelines for heart healthy nutrition including fat, fiber, MyPlate, the role of sodium in heart healthy nutrition, utilization of the nutrition label, and utilization of this knowledge for meal planning. Follow up email sent as well. Written material given at graduation. Flowsheet Row Pulmonary Rehab from 02/03/2023 in [facility] Cardiac and Pulmonary Rehab  Education need identified 01/18/23       Biometrics:  Pre Biometrics - 01/18/23 1446       Pre Biometrics   Height 5' 6.2" (1.681 m)    Weight 165 lb (74.8 kg)    Waist Circumference 35 inches    Hip Circumference 41 inches    Waist to Hip Ratio 0.85 %    BMI (Calculated) 26.49    Single Leg Stand 19.1 seconds              Nutrition Therapy Plan and Nutrition Goals:  Nutrition Therapy & Goals - 02/10/23 1331       Nutrition Therapy   Diet Heart healthy, low Na    Drug/Food Interactions Coumadin/Vit K    Protein (specify units) 90g    Fiber 25 grams    Whole Grain Foods 3 servings    Saturated Fats 12 max. grams    Fruits and Vegetables 8 servings/day    Sodium 2 grams      Personal Nutrition Goals   Nutrition Goal ST: use butter spread instead of butter (she likes [place] with canola oil), read/compare food labels for sodium, had a heart healthy snack  or small lunch during the day, try baking with modifications such as using applesauce instead of some butter and sugar, eat out 1 less day per week, include quinoa 1-2x/week instead of white rice LT: limit Na <2g/day, limit saturated fat <12g, include 20-25g of fiber per day, eat out <1-2x/week    Comments 77 y.o. F admitted to pulmonary rehab for DOE. PMHx includes HTN, chronic combined systolic and diastolic CHF, pulmonary HTN, GERD, HLD. Medications reviewed vit C, pantoprazole, zoloft, trazodone, warfarin, vit D3, hydrochlorothiazide, diazepam. She uses frozen or fresh vegetables, she reads her labels to lower salt intake, she usually uses olive oil and butter, but reports using more butter. She reports going out to eat 2-3x/week - sit down restaurants: filet  and salad with sweet potato and broccoli (she asks them to not add salt) at [facility], mac and cheese or cream sauces at [employer], red lobster she will usually get fried foods. B: oatmeal (rolled oats or instant) or pancakes and bacon (1x/week - if she goes out for this, she will get it 2x/week) or does not have breakfast. L: cookies or chips D: 4pm. fish and chicken most of the time (baked or grilled, sometimes fried (2x/month). She likes to have salmon 1x/week and white filet flounder seared and baked. She usually pairs her meals with rice, sweet and white potatoes, as well as non-starchy vegetables - spinach, carrots, broccoli, cauliflower, asparagus. She will also inlcude rolls, white bread, and corn bread. S: she craves sweets in the evening - she has been trying to eat more fruit. Chalsea reports not tolerating whole wheat brea dor pasta, but likes quinoa and would like to try making that at home. Drinks: water, maybe 1 soda per week and 2 fruit juices per week (100% juice). Discussed general heart healthy eating, prediabetes MNT, and warfarin/vitamin K interactions.      Intervention Plan   Intervention Prescribe, educate and counsel regarding  individualized specific dietary modifications aiming towards targeted core components such as weight, hypertension, lipid management, diabetes, heart failure and other comorbidities.    Expected Outcomes Short Term Goal: Understand basic principles of dietary content, such as calories, fat, sodium, cholesterol and nutrients.;Short Term Goal: A plan has been developed with personal nutrition goals set during dietitian appointment.;Long Term Goal: Adherence to prescribed nutrition plan.             Nutrition Assessments:  MEDIFICTS Score Key: ?70 Need to make dietary changes  40-70 Heart Healthy Diet ? 40 Therapeutic Level Cholesterol Diet  Flowsheet Row Pulmonary Rehab from 01/18/2023 in [facility] Pulmonary Re[facility]  Picture Your Plate Total Score on Admission 55      Picture Your Plate Scores: <16 Unhealthy dietary pattern with much room for improvement. 41-50 Dietary pattern unlikely to meet recommendations for good health and room for improvement. 51-60 More healthful dietary pattern, with some room for improvement.  >60 Healthy dietary pattern, although there may be some specific behaviors that could be improved.   Nutrition Goals Re-Evaluation:   Nutrition Goals Discharge (Final Nutrition Goals Re-Evaluation):   Psychosocial: Target Goals: Acknowledge presence or absence of significant depression and/or stress, maximize coping skills, provide positive support system. Participant is able to verbalize types and ability to use techniques and skills needed for reducing stress and depression.   Education: Stress, Anxiety, and Depression - Group verbal and visual presentation to define topics covered.  Reviews how body is impacted by stress, anxiety, and depression.  Also discusses healthy ways to reduce stress and to treat/manage anxiety and depression.  Written material given at graduation.   Education: Sleep Hygiene -Provides group verbal and written instruction about how  sleep can affect your health.  Define sleep hygiene, discuss sleep cycles and impact of sleep habits. Review good sleep hygiene tips.    Initial Review & Psychosocial Screening:  Initial Psych Review & Screening - 01/13/23 1017       Initial Review   Current issues with Current Psychotropic Meds;History of Depression;Current Depression;Current Anxiety/Panic;Current Sleep Concerns;Current Stress Concerns    Source of Stress Concerns Chronic Illness    Comments Letrice has always been anxious and depressed. She worries alot about the family daily that puts her in a depressive mood. She can look  to her husband sister and dsaughter for support.      Family Dynamics   Good Support System? Yes      Barriers   Psychosocial barriers to participate in program The patient should benefit from training in stress management and relaxation.      Screening Interventions   Interventions Encouraged to exercise;Program counselor consult;To provide support and resources with identified psychosocial needs;Provide feedback about the scores to participant    Expected Outcomes Short Term goal: Utilizing psychosocial counselor, staff and physician to assist with identification of specific Stressors or current issues interfering with healing process. Setting desired goal for each stressor or current issue identified.;Long Term Goal: Stressors or current issues are controlled or eliminated.;Short Term goal: Identification and review with participant of any Quality of Life or Depression concerns found by scoring the questionnaire.;Long Term goal: The participant improves quality of Life and PHQ9 Scores as seen by post scores and/or verbalization of changes             Quality of Life Scores:  Scores of 19 and below usually indicate a poorer quality of life in these areas.  A difference of  2-3 points is a clinically meaningful difference.  A difference of 2-3 points in the total score of the Quality of Life Index  has been associated with significant improvement in overall quality of life, self-image, physical symptoms, and general health in studies assessing change in quality of life.  PHQ-9: Review Flowsheet       01/18/2023  Depression screen PHQ 2/9  Decreased Interest 2  Down, Depressed, Hopeless 2  PHQ - 2 Score 4  Altered sleeping 3  Tired, decreased energy 3  Change in appetite 3  Feeling bad or failure about yourself  2  Trouble concentrating 2  Moving slowly or fidgety/restless 2  PHQ-9 Score 19  Difficult doing work/chores Somewhat difficult   Interpretation of Total Score  Total Score Depression Severity:  1-4 = Minimal depression, 5-9 = Mild depression, 10-14 = Moderate depression, 15-19 = Moderately severe depression, 20-27 = Severe depression   Psychosocial Evaluation and Intervention:  Psychosocial Evaluation - 01/13/23 1019       Psychosocial Evaluation & Interventions   Interventions Encouraged to exercise with the program and follow exercise prescription;Relaxation education;Stress management education    Comments Coree has always been anxious and depressed. She worries alot about the family daily that puts her in a depressive mood. She can look to her husband sister and dsaughter for support.    Expected Outcomes Short: Start [facility] to help with mood. Long: Maintain a healthy mental state.    Continue Psychosocial Services  Follow up required by staff             Psychosocial Re-Evaluation:   Psychosocial Discharge (Final Psychosocial Re-Evaluation):   Education: Education Goals: Education classes will be provided on a weekly basis, covering required topics. Participant will state understanding/return demonstration of topics presented.  Learning Barriers/Preferences:  Learning Barriers/Preferences - 01/13/23 1015       Learning Barriers/Preferences   Learning Barriers None    Learning Preferences None             General Pulmonary Education  Topics:  Infection Prevention: - Provides verbal and written material to individual with discussion of infection control including proper hand washing and proper equipment cleaning during exercise session. Flowsheet Row Pulmonary Rehab from 02/03/2023 in [facility]monary Rehab  Date 01/13/23  Educator [facility]  Instruction Review Code  1- Verbalizes Understanding       Falls Prevention: - Provides verbal and written material to individual with discussion of falls prevention and safety. Flowsheet Row Pulmonary Rehab from 02/03/2023 in [facility] and Pulmonary Rehab  Date 01/13/23  Educator [facility]  Instruction Review Code 1- Verbalizes Understanding       Chronic Lung Disease Review: - Group verbal instruction with posters, models, PowerPoint presentations and videos,  to review new updates, new respiratory medications, new advancements in procedures and treatments. Providing information on websites and "800" numbers for continued self-education. Includes information about supplement oxygen, available portable oxygen systems, continuous and intermittent flow rates, oxygen safety, concentrators, and Medicare reimbursement for oxygen. Explanation of Pulmonary Drugs, including class, frequency, complications, importance of spacers, rinsing mouth after steroid MDI's, and proper cleaning methods for nebulizers. Review of basic lung anatomy and physiology related to function, structure, and complications of lung disease. Review of risk factors. Discussion about methods for diagnosing sleep apnea and types of masks and machines for OSA. Includes a review of the use of types of environmental controls: home humidity, furnaces, filters, dust mite/pet prevention, HEPA vacuums. Discussion about weather changes, air quality and the benefits of nasal washing. Instruction on Warning signs, infection symptoms, calling MD promptly, preventive modes, and value of vaccinations. Review of effective airway clearance,  coughing and/or vibration techniques. Emphasizing that all should Create an Action Plan. Written material given at graduation. Flowsheet Row Pulmonary Rehab from 02/03/2023 in [facility] Cardiac and Pulmonary Rehab  Education need identified 01/18/23  Date 01/20/23  Educator [facility]  Instruction Review Code 1- Verbalizes Understanding       AED/CPR: - Group verbal and written instruction with the use of models to demonstrate the basic use of the AED with the basic ABC's of resuscitation.    Anatomy and Cardiac Procedures: - Group verbal and visual presentation and models provide information about basic cardiac anatomy and function. Reviews the testing methods done to diagnose heart disease and the outcomes of the test results. Describes the treatment choices: Medical Management, Angioplasty, or Coronary Bypass Surgery for treating various heart conditions including Myocardial Infarction, Angina, Valve Disease, and Cardiac Arrhythmias.  Written material given at graduation.   Medication Safety: - Group verbal and visual instruction to review commonly prescribed medications for heart and lung disease. Reviews the medication, class of the drug, and side effects. Includes the steps to properly store meds and maintain the prescription regimen.  Written material given at graduation.   Other: -Provides group and verbal instruction on various topics (see comments)   Knowledge Questionnaire Score:  Knowledge Questionnaire Score - 01/18/23 1447       Knowledge Questionnaire Score   Pre Score 16/18              Core Components/Risk Factors/Patient Goals at Admission:  Personal Goals and Risk Factors at Admission - 01/18/23 1447       Core Components/Risk Factors/Patient Goals on Admission    Weight Management Yes;Weight Loss    Intervention Weight Management: Develop a combined nutrition and exercise program designed to reach desired caloric intake, while maintaining appropriate intake of  nutrient and fiber, sodium and fats, and appropriate energy expenditure required for the weight goal.;Weight Management: Provide education and appropriate resources to help participant work on and attain dietary goals.;Weight Management/Obesity: Establish reasonable short term and long term weight goals.    Admit Weight 165 lb (74.8 kg)    Goal Weight: Short Term 160 lb (72.6 kg)  Goal Weight: Long Term 155 lb (70.3 kg)    Expected Outcomes Short Term: Continue to assess and modify interventions until short term weight is achieved;Long Term: Adherence to nutrition and physical activity/exercise program aimed toward attainment of established weight goal;Weight Loss: Understanding of general recommendations for a balanced deficit meal plan, which promotes 1-2 lb weight loss per week and includes a negative energy balance of [phone] kcal/d;Understanding recommendations for meals to include 15-35% energy as protein, 25-35% energy from fat, 35-60% energy from carbohydrates, less than 200mg  of dietary cholesterol, 20-35 gm of total fiber daily;Understanding of distribution of calorie intake throughout the day with the consumption of 4-5 meals/snacks    Improve shortness of breath with ADL's Yes    Intervention Provide education, individualized exercise plan and daily activity instruction to help decrease symptoms of SOB with activities of daily living.    Expected Outcomes Short Term: Improve cardiorespiratory fitness to achieve a reduction of symptoms when performing ADLs;Long Term: Be able to perform more ADLs without symptoms or delay the onset of symptoms    Increase knowledge of respiratory medications and ability to use respiratory devices properly  Yes    Intervention Provide education and demonstration as needed of appropriate use of medications, inhalers, and oxygen therapy.    Expected Outcomes Short Term: Achieves understanding of medications use. Understands that oxygen is a medication prescribed  by physician. Demonstrates appropriate use of inhaler and oxygen therapy.;Long Term: Maintain appropriate use of medications, inhalers, and oxygen therapy.    Hypertension Yes    Intervention Provide education on lifestyle modifcations including regular physical activity/exercise, weight management, moderate sodium restriction and increased consumption of fresh fruit, vegetables, and low fat dairy, alcohol moderation, and smoking cessation.;Monitor prescription use compliance.    Expected Outcomes Short Term: Continued assessment and intervention until BP is < 140/41mm HG in hypertensive participants. < 130/2mm HG in hypertensive participants with diabetes, heart failure or chronic kidney disease.;Long Term: Maintenance of blood pressure at goal levels.    Lipids Yes    Intervention Provide education and support for participant on nutrition & aerobic/resistive exercise along with prescribed medications to achieve LDL 70mg , HDL >40mg .    Expected Outcomes Short Term: Participant states understanding of desired cholesterol values and is compliant with medications prescribed. Participant is following exercise prescription and nutrition guidelines.;Long Term: Cholesterol controlled with medications as prescribed, with individualized exercise RX and with personalized nutrition plan. Value goals: LDL < 70mg , HDL > 40 mg.             Education:Diabetes - Individual verbal and written instruction to review signs/symptoms of diabetes, desired ranges of glucose level fasting, after meals and with exercise. Acknowledge that pre and post exercise glucose checks will be done for 3 sessions at entry of program.   Know Your Numbers and Heart Failure: - Group verbal and visual instruction to discuss disease risk factors for cardiac and pulmonary disease and treatment options.  Reviews associated critical values for Overweight/Obesity, Hypertension, Cholesterol, and Diabetes.  Discusses basics of heart failure:  signs/symptoms and treatments.  Introduces Heart Failure Zone chart for action plan for heart failure.  Written material given at graduation.   Core Components/Risk Factors/Patient Goals Review:    Core Components/Risk Factors/Patient Goals at Discharge (Final Review):    ITP Comments:  ITP Comments     Row Name 01/13/23 1013 01/18/23 1411 01/20/23 1013 02/03/23 0928 02/10/23 1437   ITP Comments Virtual Visit completed. Patient informed on EP and RD appointment and  6 Minute walk test. Patient also informed of patient health questionnaires on My Chart. Patient Verbalizes understanding. Visit diagnosis can be found in [facility] 12/22/[facility]22. Completed and gym orientation. Initial ITP created and sent for review to Dr. Jinny Sanders, Medical Director. First full day of exercise!  Patient was oriented to gym and equipment including functions, settings, policies, and procedures.  Patient's individual exercise prescription and treatment plan were reviewed.  All starting workloads were established based on the results of the 6 minute walk test done at initial orientation visit.  The plan for exercise progression was also introduced and progression will be customized based on patient's performance and goals. 30 Day review completed. Medical Director ITP review done, changes made as directed, and signed approval by Medical Director.    new to program Completed initial RD consultation    Row Name 03/01/23 1401 03/03/23 0958 03/03/23 1350       ITP Comments Amar has not attended since 02/22/23.  She called out today due to more testing needed.  We were unable to assess her goals this round. Patient called to let us know she had a new medical diagnosis and is unsure if she wants to continue pulmonary rehab. We will take her out for 2 weeks to see how she is feeling and what she decides to do. Mentioned CARE as a back up option, should be be appropriate for it. Patient will call us in a couple weeks to let us know  her decision. 30 day review completed. ITP sent to Dr. Jinny Sanders, Medical Director of  Pulmonary Rehab. Continue with ITP unless changes are made by physician.              Comments: 30 day review

## 2023-03-03 NOTE — Telephone Encounter (Signed)
Patient called to let us know she had a new medical diagnosis and is unsure if she wants to continue pulmonary rehab. We will take her out for 2 weeks to see how she is feeling and what she decides to do. Mentioned CARE as a back up option, should be be appropriate for it. Patient will call us in a couple weeks to let us know her decision.

## 2023-03-10 ENCOUNTER — Other Ambulatory Visit: Payer: Self-pay

## 2023-03-10 DIAGNOSIS — Z780 Asymptomatic menopausal state: Secondary | ICD-10-CM

## 2023-03-15 ENCOUNTER — Encounter: Payer: Medicare Other | Admitting: *Deleted

## 2023-03-17 ENCOUNTER — Encounter: Payer: Medicare Other | Attending: Cardiovascular Disease

## 2023-03-17 DIAGNOSIS — R0609 Other forms of dyspnea: Secondary | ICD-10-CM | POA: Insufficient documentation

## 2023-03-22 ENCOUNTER — Telehealth: Payer: Self-pay

## 2023-03-22 ENCOUNTER — Encounter: Payer: Medicare Other | Admitting: *Deleted

## 2023-03-22 NOTE — Telephone Encounter (Signed)
Left message for patient to follow up on pulmonary rehab appointments, asked for callback.

## 2023-03-23 ENCOUNTER — Encounter: Payer: Self-pay | Admitting: *Deleted

## 2023-03-23 DIAGNOSIS — R0609 Other forms of dyspnea: Secondary | ICD-10-CM

## 2023-03-23 NOTE — Progress Notes (Signed)
Discharge Progress Report  Patient Details  Name: Haley Adams MRN: [id number] Date of Birth: 1946-07-28 Referring Provider:   Flowsheet Row Pulmonary Rehab from 01/18/2023 in [facility] Cardiac and Pulmonary Rehab  Referring Provider Mikki Santee MD        Number of Visits: 7  Reason for Discharge:  Early Exit:  Personal  Smoking History:  Social History   Tobacco Use  Smoking Status Never  Smokeless Tobacco Never    Diagnosis:  DOE (dyspnea on exertion)  ADL UCSD:  Pulmonary Assessment Scores     Row Name 01/18/23 1449         ADL UCSD   SOB Score total 27     Rest 0     Walk 0     Stairs 3     Bath 0     Dress 0     Shop 2       CAT Score   CAT Score 19       mMRC Score   mMRC Score 1              Initial Exercise Prescription:  Initial Exercise Prescription - 01/18/23 1400       Date of Initial Exercise RX and Referring Provider   Date 01/18/23    Referring Provider Mikki Santee MD      Oxygen   Oxygen Continuous   will need for exercise, order requested   Liters 2    Maintain Oxygen Saturation 88% or higher      Treadmill   MPH 1.6    Grade 0    Minutes 15    METs 2.23      NuStep   Level 2    SPM 80    Minutes 15    METs 2      REL-XR   Level 1    Speed 50    Minutes 15    METs 2      Track   Laps 20    Minutes 15    METs 2.09      Prescription Details   Frequency (times per week) 2    Duration Progress to 30 minutes of continuous aerobic without signs/symptoms of physical distress      Intensity   THRR 40-80% of Max Heartrate 94-128    Ratings of Perceived Exertion 11-13    Perceived Dyspnea 0-4      Progression   Progression Continue to progress workloads to maintain intensity without signs/symptoms of physical distress.      Resistance Training   Training Prescription Yes    Weight 3 lb    Reps 10-15             Discharge Exercise Prescription (Final Exercise Prescription Changes):   Exercise Prescription Changes - 03/04/23 1400       Response to Exercise   Blood Pressure (Admit) 122/62    Blood Pressure (Exercise) 140/72    Blood Pressure (Exit) 120/64    Heart Rate (Admit) 62 bpm    Heart Rate (Exercise) 77 bpm    Heart Rate (Exit) 66 bpm    Oxygen Saturation (Admit) 90 %    Oxygen Saturation (Exercise) 86 %    Oxygen Saturation (Exit) 89 %    Rating of Perceived Exertion (Exercise) 12    Perceived Dyspnea (Exercise) 0    Symptoms SOB    Duration Progress to 30 minutes of  aerobic without signs/symptoms of physical distress  Intensity THRR unchanged      Progression   Progression Continue to progress workloads to maintain intensity without signs/symptoms of physical distress.    Average METs 1.69      Resistance Training   Training Prescription Yes    Weight 3 lb    Reps 10-15      Interval Training   Interval Training No      Oxygen   Oxygen Continuous    Liters 4      Treadmill   MPH 0.9    Grade 0    Minutes 15    METs 1.69      Biostep-RELP   Level 2    Minutes 15      Oxygen   Maintain Oxygen Saturation 88% or higher             Functional Capacity:  6 Minute Walk     Row Name 01/18/23 1412         6 Minute Walk   Phase Initial     Distance 400 feet     Walk Time 2.47 minutes  stopped due to desaturation     # of Rest Breaks 0     MPH 1.84     METS 1.04     RPE 7     Perceived Dyspnea  0     VO2 Peak 3.64     Symptoms No  Pt did not feel SOB     Resting HR 60 bpm     Resting BP 126/80     Resting Oxygen Saturation  90 %     Exercise Oxygen Saturation  during 6 min walk 78 %     Max Ex. HR 84 bpm     Max Ex. BP 146/78     2 Minute Post BP 138/70       Interval HR   1 Minute HR 78     2 Minute HR 80     3 Minute HR 84  stopped at 2:28     2 Minute Post HR 66     Interval Heart Rate? Yes       Interval Oxygen   Interval Oxygen? Yes     Baseline Oxygen Saturation % 90 %     1 Minute Oxygen Saturation %  80 %     1 Minute Liters of Oxygen 0 L  Room Air     2 Minute Oxygen Saturation % 79 %     2 Minute Liters of Oxygen 0 L     3 Minute Oxygen Saturation % 78 %  At 2:28     3 Minute Liters of Oxygen 0 L     2 Minute Post Oxygen Saturation % 84 %     2 Minute Post Liters of Oxygen 0 L            Goals reviewed with patient; copy given to patient.

## 2023-03-23 NOTE — Progress Notes (Signed)
Pulmonary Individual Treatment Plan  Patient Details  Name: Haley Adams MRN: [id number] Date of Birth: 05-Dec-1945 Referring Provider:   Flowsheet Row Pulmonary Rehab from 01/18/2023 in [facility] Cardiac and Pulmonary Rehab  Referring Provider Alisia Ferrari MD       Initial Encounter Date:  Flowsheet Row Pulmonary Rehab from 01/18/2023 in [facility] Cardiac and Pulmonary Rehab  Date 01/18/23       Visit Diagnosis: DOE (dyspnea on exertion)  Patient's Home Medications on Admission:  Current Outpatient Medications:    alum & mag hydroxide-simeth (MAALOX/MYLANTA) 200-200-20 MG/5ML suspension, Take 30 mLs by mouth every 6 (six) hours as needed for indigestion., Disp: 355 mL, Rfl: 0   ambrisentan (LETAIRIS) 5 MG tablet, Take 5 mg by mouth daily. (Patient not taking: Reported on 01/13/2023), Disp: , Rfl:    ambrisentan (LETAIRIS) 5 MG tablet, Take by mouth., Disp: , Rfl:    amLODipine (NORVASC) 2.5 MG tablet, Take 2.5 mg by mouth daily. (Patient not taking: Reported on 01/13/2023), Disp: , Rfl:    amLODipine (NORVASC) 5 MG tablet, Take by mouth., Disp: , Rfl:    ascorbic acid (VITAMIN C) 500 MG tablet, Take by mouth., Disp: , Rfl:    aspirin EC 81 MG tablet, Take 1 tablet (81 mg total) by mouth daily. (Patient not taking: Reported on 01/13/2023), Disp: 30 tablet, Rfl: 1   Cholecalciferol 50 MCG (2000 UT) CAPS, Take by mouth., Disp: , Rfl:    diazepam (VALIUM) 2 MG tablet, Take 2 mg by mouth every 6 (six) hours as needed for anxiety., Disp: , Rfl:    diazepam (VALIUM) 2 MG tablet, Take by mouth. (Patient not taking: Reported on 01/13/2023), Disp: , Rfl:    hydrochlorothiazide (HYDRODIURIL) 12.5 MG tablet, TAKE 1 TABLET(12.5 MG) BY MOUTH EVERY OTHER DAY, Disp: , Rfl:    hydrochlorothiazide (MICROZIDE) 12.5 MG capsule, Take 12.5 mg by mouth as directed. Take 12.5 mg twice a week as directed by your doctor. (Patient not taking: Reported on 01/13/2023), Disp: , Rfl:    losartan (COZAAR) 50 MG tablet,  Take 50 mg by mouth 2 (two) times daily., Disp: , Rfl:    pantoprazole (PROTONIX) 40 MG tablet, Take 1 tablet (40 mg total) by mouth daily. (Patient not taking: Reported on 01/13/2023), Disp: 30 tablet, Rfl: 0   sertraline (ZOLOFT) 25 MG tablet, Take 25 mg by mouth daily. (Patient not taking: Reported on 01/13/2023), Disp: , Rfl:    sertraline (ZOLOFT) 25 MG tablet, Take 1 tablet by mouth daily., Disp: , Rfl:    sodium chloride (OCEAN) 0.65 % SOLN nasal spray, Place 1 spray into both nostrils as needed for congestion., Disp: 1 Bottle, Rfl: 0   sotalol (BETAPACE) 120 MG tablet, Take 120 mg by mouth 2 (two) times daily., Disp: , Rfl:    sotalol (BETAPACE) 120 MG tablet, Take 1 tablet by mouth 2 (two) times daily. (Patient not taking: Reported on 01/13/2023), Disp: , Rfl:    traZODone (DESYREL) 50 MG tablet, Take 25-50 mg by mouth at bedtime as needed for sleep. (Patient not taking: Reported on 01/13/2023), Disp: , Rfl:    warfarin (COUMADIN) 1 MG tablet, Take 1-2 mg by mouth as directed. On Tuesday and Thursday take 2 (two) tablets with one 5 mg tablet for a total dose of 7 mg. On all other days take 1 (one) tablet with one 5 mg tablet for a total dose of 6 mg., Disp: , Rfl:    warfarin (COUMADIN) 1 MG tablet, Take  by mouth. (Patient not taking: Reported on 01/13/2023), Disp: , Rfl:    warfarin (COUMADIN) 5 MG tablet, Take 5 mg by mouth daily., Disp: , Rfl:   Past Medical History: Past Medical History:  Diagnosis Date   Anxiety    Atrial fibrillation (HCC)    CHF (congestive heart failure) (HCC)    GERD (gastroesophageal reflux disease)    HLD (hyperlipidemia)    Hypertension    Pulmonary hypertension (HCC)    VSD (ventricular septal defect)     Tobacco Use: Social History   Tobacco Use  Smoking Status Never  Smokeless Tobacco Never    Labs: Review Flowsheet       Latest Ref Rng & Units 07/09/2012 02/03/2013 09/30/2018  Labs for ITP Cardiac and Pulmonary Rehab  Cholestrol 0 - 200 mg/dL  180  226  -  LDL (calc) 0 - 100 mg/dL 111  152  -  HDL-C 40 - 60 mg/dL 48  37  -  Trlycerides 0 - 200 mg/dL 104  183  -  Hemoglobin A1c 4.8 - 5.6 % 6.3  - 6.2      Pulmonary Assessment Scores:  Pulmonary Assessment Scores     Row Name 01/18/23 1449         ADL UCSD   SOB Score total 27     Rest 0     Walk 0     Stairs 3     Bath 0     Dress 0     Shop 2       CAT Score   CAT Score 19       mMRC Score   mMRC Score 1              UCSD: Self-administered rating of dyspnea associated with activities of daily living (ADLs) 6-point scale (0 = "not at all" to 5 = "maximal or unable to do because of breathlessness")  Scoring Scores range from 0 to 120.  Minimally important difference is 5 units  CAT: CAT can identify the health impairment of COPD patients and is better correlated with disease progression.  CAT has a scoring range of zero to 40. The CAT score is classified into four groups of low (less than 10), medium (10 - 20), high (21-30) and very high (31-40) based on the impact level of disease on health status. A CAT score over 10 suggests significant symptoms.  A worsening CAT score could be explained by an exacerbation, poor medication adherence, poor inhaler technique, or progression of COPD or comorbid conditions.  CAT MCID is 2 points  mMRC: mMRC (Modified Medical Research Council) Dyspnea Scale is used to assess the degree of baseline functional disability in patients of respiratory disease due to dyspnea. No minimal important difference is established. A decrease in score of 1 point or greater is considered a positive change.   Pulmonary Function Assessment:  Pulmonary Function Assessment - 01/18/23 1449       Breath   Shortness of Breath Yes;Panic with Shortness of Breath;Limiting activity             Exercise Target Goals: Exercise Program Goal: Individual exercise prescription set using results from initial 6 min walk test and THRR while  considering  patient's activity barriers and safety.   Exercise Prescription Goal: Initial exercise prescription builds to 30-45 minutes a day of aerobic activity, 2-3 days per week.  Home exercise guidelines will be given to patient during program as part of exercise prescription  that the participant will acknowledge.  Education: Aerobic Exercise: - Group verbal and visual presentation on the components of exercise prescription. Introduces F.I.T.T principle from ACSM for exercise prescriptions.  Reviews F.I.T.T. principles of aerobic exercise including progression. Written material given at graduation.   Education: Resistance Exercise: - Group verbal and visual presentation on the components of exercise prescription. Introduces F.I.T.T principle from ACSM for exercise prescriptions  Reviews F.I.T.T. principles of resistance exercise including progression. Written material given at graduation.    Education: Exercise & Equipment Safety: - Individual verbal instruction and demonstration of equipment use and safety with use of the equipment. Flowsheet Row Pulmonary Rehab from 02/03/2023 in [facility] Cardiac and Pulmonary Rehab  Date 01/13/23  Educator [facility]  Instruction Review Code 1- Verbalizes Understanding       Education: Exercise Physiology & General Exercise Guidelines: - Group verbal and written instruction with models to review the exercise physiology of the cardiovascular system and associated critical values. Provides general exercise guidelines with specific guidelines to those with heart or lung disease.  Flowsheet Row Pulmonary Rehab from 02/03/2023 in [facility] Cardiac and Pulmonary Rehab  Date 02/03/23  Educator [facility]  Instruction Review Code 1- Verbalizes Understanding       Education: Flexibility, Balance, Mind/Body Relaxation: - Group verbal and visual presentation with interactive activity on the components of exercise prescription. Introduces F.I.T.T principle from ACSM for exercise  prescriptions. Reviews F.I.T.T. principles of flexibility and balance exercise training including progression. Also discusses the mind body connection.  Reviews various relaxation techniques to help reduce and manage stress (i.e. Deep breathing, progressive muscle relaxation, and visualization). Balance handout provided to take home. Written material given at graduation.   Activity Barriers & Risk Stratification:  Activity Barriers & Cardiac Risk Stratification - 01/18/23 1440       Activity Barriers & Cardiac Risk Stratification   Activity Barriers Deconditioning;Muscular Weakness;Shortness of Breath             6 Minute Walk:  6 Minute Walk     Row Name 01/18/23 1412         6 Minute Walk   Phase Initial     Distance 400 feet     Walk Time 2.47 minutes  stopped due to desaturation     # of Rest Breaks 0     MPH 1.84     METS 1.04     RPE 7     Perceived Dyspnea  0     VO2 Peak 3.64     Symptoms No  Pt did not feel SOB     Resting HR 60 bpm     Resting BP 126/80     Resting Oxygen Saturation  90 %     Exercise Oxygen Saturation  during 6 min walk 78 %     Max Ex. HR 84 bpm     Max Ex. BP 146/78     2 Minute Post BP 138/70       Interval HR   1 Minute HR 78     2 Minute HR 80     3 Minute HR 84  stopped at 2:28     2 Minute Post HR 66     Interval Heart Rate? Yes       Interval Oxygen   Interval Oxygen? Yes     Baseline Oxygen Saturation % 90 %     1 Minute Oxygen Saturation % 80 %     1 Minute Liters of Oxygen 0  L  Room Air     2 Minute Oxygen Saturation % 79 %     2 Minute Liters of Oxygen 0 L     3 Minute Oxygen Saturation % 78 %  At 2:28     3 Minute Liters of Oxygen 0 L     2 Minute Post Oxygen Saturation % 84 %     2 Minute Post Liters of Oxygen 0 L             Oxygen Initial Assessment:  Oxygen Initial Assessment - 01/18/23 1448       Home Oxygen   Home Oxygen Device Home Concentrator    Sleep Oxygen Prescription Continuous    Liters per  minute 4    Home Exercise Oxygen Prescription None    Home Resting Oxygen Prescription None    Compliance with Home Oxygen Use Yes      Initial 6 min Walk   Oxygen Used None      Program Oxygen Prescription   Program Oxygen Prescription Continuous;E-Tanks    Liters per minute 2    Comments will need oxygen to exercise, note sent to doctor to request ongoing oxygen therapy for home and rehab      Intervention   Short Term Goals To learn and exhibit compliance with exercise, home and travel O2 prescription;To learn and understand importance of maintaining oxygen saturations>88%;To learn and understand importance of monitoring SPO2 with pulse oximeter and demonstrate accurate use of the pulse oximeter.;To learn and demonstrate proper pursed lip breathing techniques or other breathing techniques.     Long  Term Goals Exhibits compliance with exercise, home  and travel O2 prescription;Maintenance of O2 saturations>88%;Compliance with respiratory medication;Verbalizes importance of monitoring SPO2 with pulse oximeter and return demonstration;Exhibits proper breathing techniques, such as pursed lip breathing or other method taught during program session             Oxygen Re-Evaluation:  Oxygen Re-Evaluation     Row Name 01/20/23 1016             Goals/Expected Outcomes   Comments Reviewed PLB technique with pt.  Talked about how it works and it's importance in maintaining their exercise saturations.       Goals/Expected Outcomes Short: Become more profiecient at using PLB. Long: Become independent at using PLB.                Oxygen Discharge (Final Oxygen Re-Evaluation):  Oxygen Re-Evaluation - 01/20/23 1016       Goals/Expected Outcomes   Comments Reviewed PLB technique with pt.  Talked about how it works and it's importance in maintaining their exercise saturations.    Goals/Expected Outcomes Short: Become more profiecient at using PLB. Long: Become independent at using  PLB.             Initial Exercise Prescription:  Initial Exercise Prescription - 01/18/23 1400       Date of Initial Exercise RX and Referring Provider   Date 01/18/23    Referring Provider Mikki Santee MD      Oxygen   Oxygen Continuous   will need for exercise, order requested   Liters 2    Maintain Oxygen Saturation 88% or higher      Treadmill   MPH 1.6    Grade 0    Minutes 15    METs 2.23      NuStep   Level 2    SPM 80    Minutes  15    METs 2      REL-XR   Level 1    Speed 50    Minutes 15    METs 2      Track   Laps 20    Minutes 15    METs 2.09      Prescription Details   Frequency (times per week) 2    Duration Progress to 30 minutes of continuous aerobic without signs/symptoms of physical distress      Intensity   THRR 40-80% of Max Heartrate 94-128    Ratings of Perceived Exertion 11-13    Perceived Dyspnea 0-4      Progression   Progression Continue to progress workloads to maintain intensity without signs/symptoms of physical distress.      Resistance Training   Training Prescription Yes    Weight 3 lb    Reps 10-15             Perform Capillary Blood Glucose checks as needed.  Exercise Prescription Changes:   Exercise Prescription Changes     Row Name 01/18/23 1400 02/04/23 1400 02/18/23 1300 03/04/23 1400       Response to Exercise   Blood Pressure (Admit) 126/80 126/74 110/64 122/62    Blood Pressure (Exercise) 146/78 134/72 124/60 140/72    Blood Pressure (Exit) 116/64 122/70 112/62 120/64    Heart Rate (Admit) 60 bpm 63 bpm 56 bpm 62 bpm    Heart Rate (Exercise) 84 bpm 81 bpm 76 bpm 77 bpm    Heart Rate (Exit) 61 bpm 63 bpm 62 bpm 66 bpm    Oxygen Saturation (Admit) 90 % 90 % 93 % 90 %    Oxygen Saturation (Exercise) 78 % 87 % 88 % 86 %    Oxygen Saturation (Exit) 89 % 92 % 89 % 89 %    Rating of Perceived Exertion (Exercise) 7 11 13 12     Perceived Dyspnea (Exercise) 0 -- 1 0    Symptoms none  stopped due  to desaturation by staff none SOB SOB    Comments walk test results First full day of exercise -- --    Duration -- Progress to 30 minutes of  aerobic without signs/symptoms of physical distress Progress to 30 minutes of  aerobic without signs/symptoms of physical distress Progress to 30 minutes of  aerobic without signs/symptoms of physical distress    Intensity -- THRR unchanged THRR unchanged THRR unchanged      Progression   Progression -- Continue to progress workloads to maintain intensity without signs/symptoms of physical distress. Continue to progress workloads to maintain intensity without signs/symptoms of physical distress. Continue to progress workloads to maintain intensity without signs/symptoms of physical distress.    Average METs -- 2.6 2.2 1.69      Resistance Training   Training Prescription -- Yes Yes Yes    Weight -- 3 lb 3 lb 3 lb    Reps -- 10-15 10-15 10-15      Interval Training   Interval Training -- No No No      Oxygen   Oxygen -- Continuous Continuous Continuous    Liters -- 3 4 4       Treadmill   MPH -- -- 1.6 0.9    Grade -- -- 0 0    Minutes -- -- 15 15    METs -- -- 2.7 1.69      NuStep   Level -- -- 3 --    Minutes -- --  15 --    METs -- -- 2.36 --      REL-XR   Level -- 1 1 --    Minutes -- 15 15 --    METs -- 2.6 2.7 --      Biostep-RELP   Level -- -- -- 2    Minutes -- -- -- 15      Oxygen   Maintain Oxygen Saturation -- 88% or higher 88% or higher 88% or higher             Exercise Comments:   Exercise Comments     Row Name 01/20/23 1014           Exercise Comments First full day of exercise!  Patient was oriented to gym and equipment including functions, settings, policies, and procedures.  Patient's individual exercise prescription and treatment plan were reviewed.  All starting workloads were established based on the results of the 6 minute walk test done at initial orientation visit.  The plan for exercise  progression was also introduced and progression will be customized based on patient's performance and goals.                Exercise Goals and Review:   Exercise Goals     Row Name 01/18/23 1446             Exercise Goals   Increase Physical Activity Yes       Intervention Provide advice, education, support and counseling about physical activity/exercise needs.;Develop an individualized exercise prescription for aerobic and resistive training based on initial evaluation findings, risk stratification, comorbidities and participant's personal goals.       Expected Outcomes Long Term: Add in home exercise to make exercise part of routine and to increase amount of physical activity.;Short Term: Attend rehab on a regular basis to increase amount of physical activity.;Long Term: Exercising regularly at least 3-5 days a week.       Increase Strength and Stamina Yes       Intervention Provide advice, education, support and counseling about physical activity/exercise needs.;Develop an individualized exercise prescription for aerobic and resistive training based on initial evaluation findings, risk stratification, comorbidities and participant's personal goals.       Expected Outcomes Short Term: Increase workloads from initial exercise prescription for resistance, speed, and METs.;Long Term: Improve cardiorespiratory fitness, muscular endurance and strength as measured by increased METs and functional capacity ( );Short Term: Perform resistance training exercises routinely during rehab and add in resistance training at home       Able to understand and use rate of perceived exertion (RPE) scale Yes       Intervention Provide education and explanation on how to use RPE scale       Expected Outcomes Short Term: Able to use RPE daily in rehab to express subjective intensity level;Long Term:  Able to use RPE to guide intensity level when exercising independently       Able to understand and use  Dyspnea scale Yes       Intervention Provide education and explanation on how to use Dyspnea scale       Expected Outcomes Short Term: Able to use Dyspnea scale daily in rehab to express subjective sense of shortness of breath during exertion;Long Term: Able to use Dyspnea scale to guide intensity level when exercising independently       Knowledge and understanding of Target Heart Rate Range (THRR) Yes       Intervention Provide education and explanation  of THRR including how the numbers were predicted and where they are located for reference       Expected Outcomes Short Term: Able to state/look up THRR;Short Term: Able to use daily as guideline for intensity in rehab;Long Term: Able to use THRR to govern intensity when exercising independently       Able to check pulse independently Yes       Intervention Review the importance of being able to check your own pulse for safety during independent exercise;Provide education and demonstration on how to check pulse in carotid and radial arteries.       Expected Outcomes Short Term: Able to explain why pulse checking is important during independent exercise;Long Term: Able to check pulse independently and accurately       Understanding of Exercise Prescription Yes       Intervention Provide education, explanation, and written materials on patient's individual exercise prescription       Expected Outcomes Short Term: Able to explain program exercise prescription;Long Term: Able to explain home exercise prescription to exercise independently                Exercise Goals Re-Evaluation :  Exercise Goals Re-Evaluation     Row Name 01/20/23 1014 02/04/23 1442 02/18/23 1349 03/04/23 1425 03/16/23 1603     Exercise Goal Re-Evaluation   Exercise Goals Review Able to understand and use rate of perceived exertion (RPE) scale;Able to understand and use Dyspnea scale;Knowledge and understanding of Target Heart Rate Range (THRR);Understanding of Exercise  Prescription Increase Physical Activity;Increase Strength and Stamina;Understanding of Exercise Prescription Increase Physical Activity;Increase Strength and Stamina;Understanding of Exercise Prescription Increase Physical Activity;Increase Strength and Stamina;Understanding of Exercise Prescription Increase Physical Activity;Increase Strength and Stamina;Understanding of Exercise Prescription   Comments Reviewed RPE scale, THR and program prescription with pt today.  Pt voiced understanding and was given a copy of goals to take home. Dilara is off to a good start in the program. She showed up late for her first day so she was only able to do the XR machine for 15 minutes. She did well at level 1 on the XR and tolerated using 3 lb hand weights for resistance training. We will continue to monitor her progress in the program. Rosezetta continues to do well in rehab. She has been able to increase to level 3 on the T4 Nustep. She is careful with her pace on the track and treadmill as we are making sure she is adhering to stay above 88% oxygen saturation. Her dyspnea ranges around 1 and continues to have appropriate RPEs. Will continue to monitor. Jackey has only attended rehab once since the last review due to a new medical diagnosis and is unsure if she wants to continue pulmonary rehab. We will take her out for 2 weeks to see how she is feeling and what she decides to do. Patient will call us in a couple weeks to let us know her decision. During her one session she walked the treadmill at 0.9 mph with no incline and worked at level 2 on the biostep. We will continue to monitor her progress if she decides to return to the program. Patient continues to remin out of rehab at this time as she is currently going through a new cancer diagnosis and is supposed to call us back regarding if she would like to continue pulmonary rehab or not. Will attempt to follow up with patient again.   Expected Outcomes Short: Use RPE  daily to  regulate intensity. Long: Follow program prescription in THR. Short: Conitnue to follow current exercise prescription. Long: Continue to attend cardiac rehab to improve strength and stamina. Short: Slowly build up speed on treadmill when tolerated with O2 saturations Long: Continue to increase overall MET level and stamina Short: Decide if she wants to continue rehab. Long: Continue to improve strength and stamina. Short: Decide if she wants to continue rehab. Long: Graduate from [employer]            Discharge Exercise Prescription (Final Exercise Prescription Changes):  Exercise Prescription Changes - 03/04/23 1400       Response to Exercise   Blood Pressure (Admit) 122/62    Blood Pressure (Exercise) 140/72    Blood Pressure (Exit) 120/64    Heart Rate (Admit) 62 bpm    Heart Rate (Exercise) 77 bpm    Heart Rate (Exit) 66 bpm    Oxygen Saturation (Admit) 90 %    Oxygen Saturation (Exercise) 86 %    Oxygen Saturation (Exit) 89 %    Rating of Perceived Exertion (Exercise) 12    Perceived Dyspnea (Exercise) 0    Symptoms SOB    Duration Progress to 30 minutes of  aerobic without signs/symptoms of physical distress    Intensity THRR unchanged      Progression   Progression Continue to progress workloads to maintain intensity without signs/symptoms of physical distress.    Average METs 1.69      Resistance Training   Training Prescription Yes    Weight 3 lb    Reps 10-15      Interval Training   Interval Training No      Oxygen   Oxygen Continuous    Liters 4      Treadmill   MPH 0.9    Grade 0    Minutes 15    METs 1.69      Biostep-RELP   Level 2    Minutes 15      Oxygen   Maintain Oxygen Saturation 88% or higher             Nutrition:  Target Goals: Understanding of nutrition guidelines, daily intake of sodium 1500mg , cholesterol 200mg , calories 30% from fat and 7% or less from saturated fats, daily to have 5 or more servings of fruits and  vegetables.  Education: All About Nutrition: -Group instruction provided by verbal, written material, interactive activities, discussions, models, and posters to present general guidelines for heart healthy nutrition including fat, fiber, MyPlate, the role of sodium in heart healthy nutrition, utilization of the nutrition label, and utilization of this knowledge for meal planning. Follow up email sent as well. Written material given at graduation. Flowsheet Row Pulmonary Rehab from 02/03/2023 in [facility] Cardiac and Pulmonary Rehab  Education need identified 01/18/23       Biometrics:  Pre Biometrics - 01/18/23 1446       Pre Biometrics   Height 5' 6.2" (1.681 m)    Weight 165 lb (74.8 kg)    Waist Circumference 35 inches    Hip Circumference 41 inches    Waist to Hip Ratio 0.85 %    BMI (Calculated) 26.49    Single Leg Stand 19.1 seconds              Nutrition Therapy Plan and Nutrition Goals:  Nutrition Therapy & Goals - 02/10/23 1331       Nutrition Therapy   Diet Heart healthy, low Na    Drug/Food Interactions Coumadin/Vit  K    Protein (specify units) 90g    Fiber 25 grams    Whole Grain Foods 3 servings    Saturated Fats 12 max. grams    Fruits and Vegetables 8 servings/day    Sodium 2 grams      Personal Nutrition Goals   Nutrition Goal ST: use butter spread instead of butter (she likes [place] with canola oil), read/compare food labels for sodium, had a heart healthy snack or small lunch during the day, try baking with modifications such as using applesauce instead of some butter and sugar, eat out 1 less day per week, include quinoa 1-2x/week instead of white rice LT: limit Na <2g/day, limit saturated fat <12g, include 20-25g of fiber per day, eat out <1-2x/week    Comments 77 y.o. F admitted to pulmonary rehab for DOE. PMHx includes HTN, chronic combined systolic and diastolic CHF, pulmonary HTN, GERD, HLD. Medications reviewed vit C, pantoprazole, zoloft,  trazodone, warfarin, vit D3, hydrochlorothiazide, diazepam. She uses frozen or fresh vegetables, she reads her labels to lower salt intake, she usually uses olive oil and butter, but reports using more butter. She reports going out to eat 2-3x/week - sit down restaurants: filet and salad with sweet potato and broccoli (she asks them to not add salt) at [facility], mac and cheese or cream sauces at [employer], red lobster she will usually get fried foods. B: oatmeal (rolled oats or instant) or pancakes and bacon (1x/week - if she goes out for this, she will get it 2x/week) or does not have breakfast. L: cookies or chips D: 4pm. fish and chicken most of the time (baked or grilled, sometimes fried (2x/month). She likes to have salmon 1x/week and white filet flounder seared and baked. She usually pairs her meals with rice, sweet and white potatoes, as well as non-starchy vegetables - spinach, carrots, broccoli, cauliflower, asparagus. She will also inlcude rolls, white bread, and corn bread. S: she craves sweets in the evening - she has been trying to eat more fruit. Maddy reports not tolerating whole wheat brea dor pasta, but likes quinoa and would like to try making that at home. Drinks: water, maybe 1 soda per week and 2 fruit juices per week (100% juice). Discussed general heart healthy eating, prediabetes MNT, and warfarin/vitamin K interactions.      Intervention Plan   Intervention Prescribe, educate and counsel regarding individualized specific dietary modifications aiming towards targeted core components such as weight, hypertension, lipid management, diabetes, heart failure and other comorbidities.    Expected Outcomes Short Term Goal: Understand basic principles of dietary content, such as calories, fat, sodium, cholesterol and nutrients.;Short Term Goal: A plan has been developed with personal nutrition goals set during dietitian appointment.;Long Term Goal: Adherence to prescribed nutrition plan.              Nutrition Assessments:  MEDIFICTS Score Key: ?70 Need to make dietary changes  40-70 Heart Healthy Diet ? 40 Therapeutic Level Cholesterol Diet  Flowsheet Row Pulmonary Rehab from 01/18/2023 in [facility] Cardiac and Pulmonary Rehab  Picture Your Plate Total Score on Admission 55      Picture Your Plate Scores: <16 Unhealthy dietary pattern with much room for improvement. 41-50 Dietary pattern unlikely to meet recommendations for good health and room for improvement. 51-60 More healthful dietary pattern, with some room for improvement.  >60 Healthy dietary pattern, although there may be some specific behaviors that could be improved.   Nutrition Goals Re-Evaluation:  Nutrition Goals Discharge (Final Nutrition Goals Re-Evaluation):   Psychosocial: Target Goals: Acknowledge presence or absence of significant depression and/or stress, maximize coping skills, provide positive support system. Participant is able to verbalize types and ability to use techniques and skills needed for reducing stress and depression.   Education: Stress, Anxiety, and Depression - Group verbal and visual presentation to define topics covered.  Reviews how body is impacted by stress, anxiety, and depression.  Also discusses healthy ways to reduce stress and to treat/manage anxiety and depression.  Written material given at graduation.   Education: Sleep Hygiene -Provides group verbal and written instruction about how sleep can affect your health.  Define sleep hygiene, discuss sleep cycles and impact of sleep habits. Review good sleep hygiene tips.    Initial Review & Psychosocial Screening:  Initial Psych Review & Screening - 01/13/23 1017       Initial Review   Current issues with Current Psychotropic Meds;History of Depression;Current Depression;Current Anxiety/Panic;Current Sleep Concerns;Current Stress Concerns    Source of Stress Concerns Chronic Illness    Comments Aalycia has always  been anxious and depressed. She worries alot about the family daily that puts her in a depressive mood. She can look to her husband sister and dsaughter for support.      Family Dynamics   Good Support System? Yes      Barriers   Psychosocial barriers to participate in program The patient should benefit from training in stress management and relaxation.      Screening Interventions   Interventions Encouraged to exercise;Program counselor consult;To provide support and resources with identified psychosocial needs;Provide feedback about the scores to participant    Expected Outcomes Short Term goal: Utilizing psychosocial counselor, staff and physician to assist with identification of specific Stressors or current issues interfering with healing process. Setting desired goal for each stressor or current issue identified.;Long Term Goal: Stressors or current issues are controlled or eliminated.;Short Term goal: Identification and review with participant of any Quality of Life or Depression concerns found by scoring the questionnaire.;Long Term goal: The participant improves quality of Life and PHQ9 Scores as seen by post scores and/or verbalization of changes             Quality of Life Scores:  Scores of 19 and below usually indicate a poorer quality of life in these areas.  A difference of  2-3 points is a clinically meaningful difference.  A difference of 2-3 points in the total score of the Quality of Life Index has been associated with significant improvement in overall quality of life, self-image, physical symptoms, and general health in studies assessing change in quality of life.  PHQ-9: Review Flowsheet       01/18/2023  Depression screen PHQ 2/9  Decreased Interest 2  Down, Depressed, Hopeless 2  PHQ - 2 Score 4  Altered sleeping 3  Tired, decreased energy 3  Change in appetite 3  Feeling bad or failure about yourself  2  Trouble concentrating 2  Moving slowly or  fidgety/restless 2  PHQ-9 Score 19  Difficult doing work/chores Somewhat difficult   Interpretation of Total Score  Total Score Depression Severity:  1-4 = Minimal depression, 5-9 = Mild depression, 10-14 = Moderate depression, 15-19 = Moderately severe depression, 20-27 = Severe depression   Psychosocial Evaluation and Intervention:  Psychosocial Evaluation - 01/13/23 1019       Psychosocial Evaluation & Interventions   Interventions Encouraged to exercise with the program and follow exercise prescription;Relaxation  education;Stress management education    Comments Jimisha has always been anxious and depressed. She worries alot about the family daily that puts her in a depressive mood. She can look to her husband sister and dsaughter for support.    Expected Outcomes Short: Start [facility] to help with mood. Long: Maintain a healthy mental state.    Continue Psychosocial Services  Follow up required by staff             Psychosocial Re-Evaluation:   Psychosocial Discharge (Final Psychosocial Re-Evaluation):   Education: Education Goals: Education classes will be provided on a weekly basis, covering required topics. Participant will state understanding/return demonstration of topics presented.  Learning Barriers/Preferences:  Learning Barriers/Preferences - 01/13/23 1015       Learning Barriers/Preferences   Learning Barriers None    Learning Preferences None             General Pulmonary Education Topics:  Infection Prevention: - Provides verbal and written material to individual with discussion of infection control including proper hand washing and proper equipment cleaning during exercise session. Flowsheet Row Pulmonary Rehab from 02/03/2023 in [facility] Cardiac and Pulmonary Rehab  Date 01/13/23  Educator [facility]  Instruction Review Code 1- Verbalizes Understanding       Falls Prevention: - Provides verbal and written material to individual with discussion of  falls prevention and safety. Flowsheet Row Pulmonary Rehab from 02/03/2023 in [facility] Cardiac and Pulmonary Rehab  Date 01/13/23  Educator [facility]  Instruction Review Code 1- Verbalizes Understanding       Chronic Lung Disease Review: - Group verbal instruction with posters, models, PowerPoint presentations and videos,  to review new updates, new respiratory medications, new advancements in procedures and treatments. Providing information on websites and "800" numbers for continued self-education. Includes information about supplement oxygen, available portable oxygen systems, continuous and intermittent flow rates, oxygen safety, concentrators, and Medicare reimbursement for oxygen. Explanation of Pulmonary Drugs, including class, frequency, complications, importance of spacers, rinsing mouth after steroid MDI's, and proper cleaning methods for nebulizers. Review of basic lung anatomy and physiology related to function, structure, and complications of lung disease. Review of risk factors. Discussion about methods for diagnosing sleep apnea and types of masks and machines for OSA. Includes a review of the use of types of environmental controls: home humidity, furnaces, filters, dust mite/pet prevention, HEPA vacuums. Discussion about weather changes, air quality and the benefits of nasal washing. Instruction on Warning signs, infection symptoms, calling MD promptly, preventive modes, and value of vaccinations. Review of effective airway clearance, coughing and/or vibration techniques. Emphasizing that all should Create an Action Plan. Written material given at graduation. Flowsheet Row Pulmonary Rehab from 02/03/2023 in [facility] Cardiac and Pulmonary Rehab  Education need identified 01/18/23  Date 01/20/23  Educator [facility]  Instruction Review Code 1- Verbalizes Understanding       AED/CPR: - Group verbal and written instruction with the use of models to demonstrate the basic use of the AED with the basic ABC's of  resuscitation.    Anatomy and Cardiac Procedures: - Group verbal and visual presentation and models provide information about basic cardiac anatomy and function. Reviews the testing methods done to diagnose heart disease and the outcomes of the test results. Describes the treatment choices: Medical Management, Angioplasty, or Coronary Bypass Surgery for treating various heart conditions including Myocardial Infarction, Angina, Valve Disease, and Cardiac Arrhythmias.  Written material given at graduation.   Medication Safety: - Group verbal and visual instruction to review commonly prescribed  medications for heart and lung disease. Reviews the medication, class of the drug, and side effects. Includes the steps to properly store meds and maintain the prescription regimen.  Written material given at graduation.   Other: -Provides group and verbal instruction on various topics (see comments)   Knowledge Questionnaire Score:  Knowledge Questionnaire Score - 01/18/23 1447       Knowledge Questionnaire Score   Pre Score 16/18              Core Components/Risk Factors/Patient Goals at Admission:  Personal Goals and Risk Factors at Admission - 01/18/23 1447       Core Components/Risk Factors/Patient Goals on Admission    Weight Management Yes;Weight Loss    Intervention Weight Management: Develop a combined nutrition and exercise program designed to reach desired caloric intake, while maintaining appropriate intake of nutrient and fiber, sodium and fats, and appropriate energy expenditure required for the weight goal.;Weight Management: Provide education and appropriate resources to help participant work on and attain dietary goals.;Weight Management/Obesity: Establish reasonable short term and long term weight goals.    Admit Weight 165 lb (74.8 kg)    Goal Weight: Short Term 160 lb (72.6 kg)    Goal Weight: Long Term 155 lb (70.3 kg)    Expected Outcomes Short Term: Continue to assess  and modify interventions until short term weight is achieved;Long Term: Adherence to nutrition and physical activity/exercise program aimed toward attainment of established weight goal;Weight Loss: Understanding of general recommendations for a balanced deficit meal plan, which promotes 1-2 lb weight loss per week and includes a negative energy balance of [phone] kcal/d;Understanding recommendations for meals to include 15-35% energy as protein, 25-35% energy from fat, 35-60% energy from carbohydrates, less than 200mg  of dietary cholesterol, 20-35 gm of total fiber daily;Understanding of distribution of calorie intake throughout the day with the consumption of 4-5 meals/snacks    Improve shortness of breath with ADL's Yes    Intervention Provide education, individualized exercise plan and daily activity instruction to help decrease symptoms of SOB with activities of daily living.    Expected Outcomes Short Term: Improve cardiorespiratory fitness to achieve a reduction of symptoms when performing ADLs;Long Term: Be able to perform more ADLs without symptoms or delay the onset of symptoms    Increase knowledge of respiratory medications and ability to use respiratory devices properly  Yes    Intervention Provide education and demonstration as needed of appropriate use of medications, inhalers, and oxygen therapy.    Expected Outcomes Short Term: Achieves understanding of medications use. Understands that oxygen is a medication prescribed by physician. Demonstrates appropriate use of inhaler and oxygen therapy.;Long Term: Maintain appropriate use of medications, inhalers, and oxygen therapy.    Hypertension Yes    Intervention Provide education on lifestyle modifcations including regular physical activity/exercise, weight management, moderate sodium restriction and increased consumption of fresh fruit, vegetables, and low fat dairy, alcohol moderation, and smoking cessation.;Monitor prescription use  compliance.    Expected Outcomes Short Term: Continued assessment and intervention until BP is < 140/22mm HG in hypertensive participants. < 130/68mm HG in hypertensive participants with diabetes, heart failure or chronic kidney disease.;Long Term: Maintenance of blood pressure at goal levels.    Lipids Yes    Intervention Provide education and support for participant on nutrition & aerobic/resistive exercise along with prescribed medications to achieve LDL 70mg , HDL >40mg .    Expected Outcomes Short Term: Participant states understanding of desired cholesterol values and is compliant with medications  prescribed. Participant is following exercise prescription and nutrition guidelines.;Long Term: Cholesterol controlled with medications as prescribed, with individualized exercise RX and with personalized nutrition plan. Value goals: LDL < 70mg , HDL > 40 mg.             Education:Diabetes - Individual verbal and written instruction to review signs/symptoms of diabetes, desired ranges of glucose level fasting, after meals and with exercise. Acknowledge that pre and post exercise glucose checks will be done for 3 sessions at entry of program.   Know Your Numbers and Heart Failure: - Group verbal and visual instruction to discuss disease risk factors for cardiac and pulmonary disease and treatment options.  Reviews associated critical values for Overweight/Obesity, Hypertension, Cholesterol, and Diabetes.  Discusses basics of heart failure: signs/symptoms and treatments.  Introduces Heart Failure Zone chart for action plan for heart failure.  Written material given at graduation.   Core Components/Risk Factors/Patient Goals Review:    Core Components/Risk Factors/Patient Goals at Discharge (Final Review):    ITP Comments:  ITP Comments     Row Name 01/13/23 1013 01/18/23 1411 01/20/23 1013 02/03/23 0928 02/10/23 1437   ITP Comments Virtual Visit completed. Patient informed on EP and RD  appointment and 6 Minute walk test. Patient also informed of patient health questionnaires on My Chart. Patient Verbalizes understanding. Visit diagnosis can be found in [facility] 12/22/2022. Completed and gym orientation. Initial ITP created and sent for review to Dr. Jinny Sanders, Medical Director. First full day of exercise!  Patient was oriented to gym and equipment including functions, settings, policies, and procedures.  Patient's individual exercise prescription and treatment plan were reviewed.  All starting workloads were established based on the results of the 6 minute walk test done at initial orientation visit.  The plan for exercise progression was also introduced and progression will be customized based on patient's performance and goals. 30 Day review completed. Medical Director ITP review done, changes made as directed, and signed approval by Medical Director.    new to program Completed initial RD consultation    Row Name 03/01/23 1401 03/03/23 0958 03/03/23 1350 03/23/23 0751     ITP Comments Milira has not attended since 02/22/23.  She called out today due to more testing needed.  We were unable to assess her goals this round. Patient called to let us know she had a new medical diagnosis and is unsure if she wants to continue pulmonary rehab. We will take her out for 2 weeks to see how she is feeling and what she decides to do. Mentioned CARE as a back up option, should be be appropriate for it. Patient will call us in a couple weeks to let us know her decision. 30 day review completed. ITP sent to Dr. Jinny Sanders, Medical Director of  Pulmonary Rehab. Continue with ITP unless changes are made by physician. Pt scheduled for masectomy, we will discharge her at this time to allow time to process and recover.             Comments: Discharge ITP

## 2023-04-17 ENCOUNTER — Emergency Department
Admission: EM | Admit: 2023-04-17 | Discharge: 2023-04-17 | Discharge status: Against Medical Advice | Payer: Medicare Other | Attending: Emergency Medicine | Admitting: Emergency Medicine

## 2023-04-17 ENCOUNTER — Other Ambulatory Visit: Payer: Self-pay

## 2023-04-17 DIAGNOSIS — R040 Epistaxis: Secondary | ICD-10-CM | POA: Diagnosis not present

## 2023-04-17 DIAGNOSIS — Z5321 Procedure and treatment not carried out due to patient leaving prior to being seen by health care provider: Secondary | ICD-10-CM | POA: Diagnosis not present

## 2023-04-17 MED ORDER — OXYMETAZOLINE HCL 0.05 % NA SOLN: 1.0000 | Freq: Once | NASAL | Status: DC

## 2023-04-17 NOTE — ED Triage Notes (Addendum)
Pt to ed from home for nose bleed that started at 7pm. Pt advised she has had nosebleeds in the past. She is currently on two blood thinners for recent surgery and she is having a hard time getting this one stopped. Pt is caox4, in no acute distress and ambulatory in triage. No active bleeding in triage., pt has towel held to nose.

## 2023-06-10 ENCOUNTER — Other Ambulatory Visit: Payer: Medicare Other

## 2023-07-08 ENCOUNTER — Ambulatory Visit
Admission: RE | Admit: 2023-07-08 | Discharge: 2023-07-08 | Disposition: A | Discharge status: Home / Self Care | Payer: Medicare Other | Source: Ambulatory Visit | Attending: Family Medicine | Admitting: Family Medicine

## 2023-07-08 DIAGNOSIS — Z780 Asymptomatic menopausal state: Secondary | ICD-10-CM | POA: Insufficient documentation

## 2023-07-28 ENCOUNTER — Other Ambulatory Visit: Payer: Medicare Other

## 2024-01-28 IMAGING — DX DG CHEST 1V PORT
1 series · 1 of 1 positions shown · non-contrast
Comparison: September 30, 2018

CLINICAL DATA: Congestion and productive cough.

EXAM:
PORTABLE CHEST 1 VIEW

[chest ap]
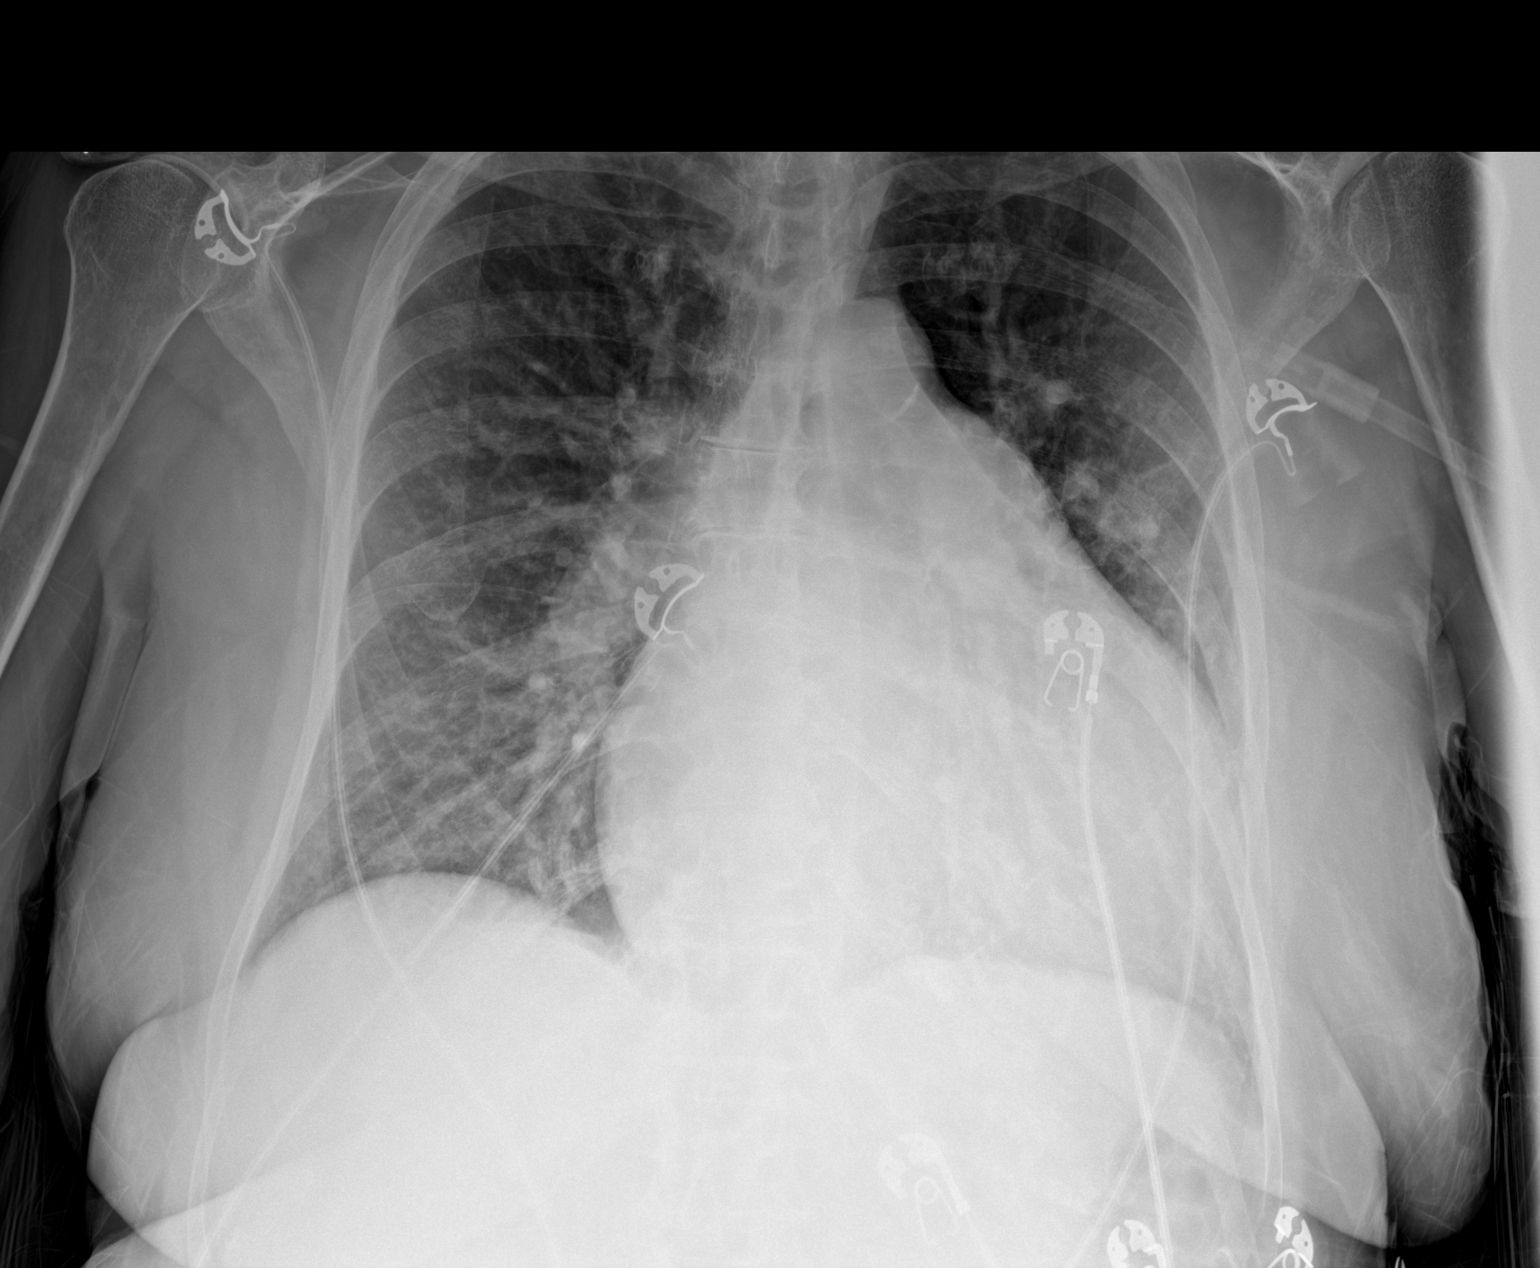

[1 of 1 positions shown; findings below may reference images not displayed]

FINDINGS: Mild, stable, diffusely increased lung markings are seen without
evidence of focal consolidation, pleural effusion or pneumothorax.
There is stable moderate to marked severity cardiomegaly with
prominence of the perihilar pulmonary vasculature. The visualized
skeletal structures are unremarkable.
IMPRESSION: Stable cardiomegaly with pulmonary vascular congestion.

## 2024-01-28 IMAGING — CT CT CHEST W/O CM
2 of 4 series · 14 of 36 positions shown, 17 images · non-contrast
Comparison: Prior radiograph from earlier the same day as well as
prior chest CT from 02/23/2011.

CLINICAL DATA: Initial evaluation for acute productive cough,
congestion, shortness of breath.



[Series 2: chest wo · axial · 0.71mm/px · z∈[-453,-211]mm · 11 of 145 slices shown, 14 images]
[im 12/145  mediastinal]
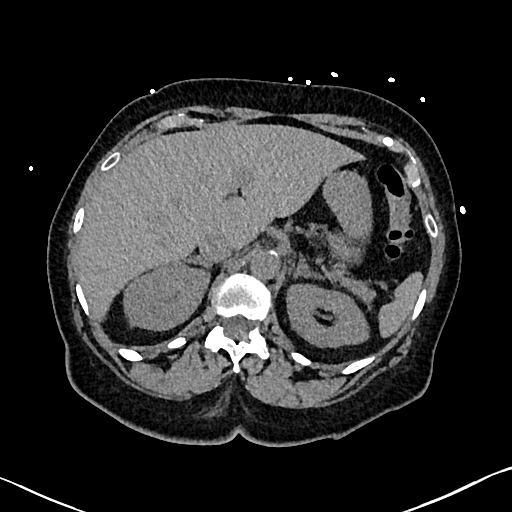
[im 12/145  lung]
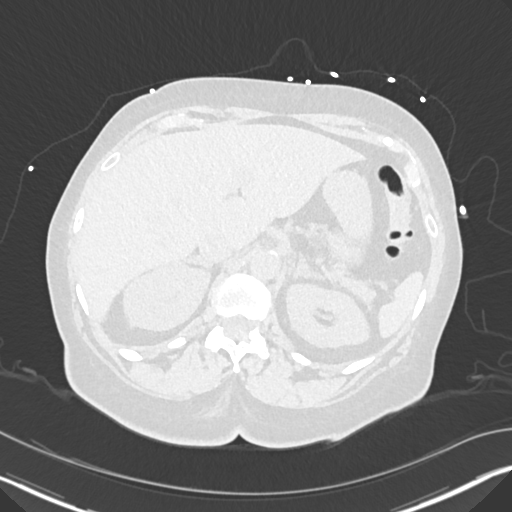
[im 23/145  lung]
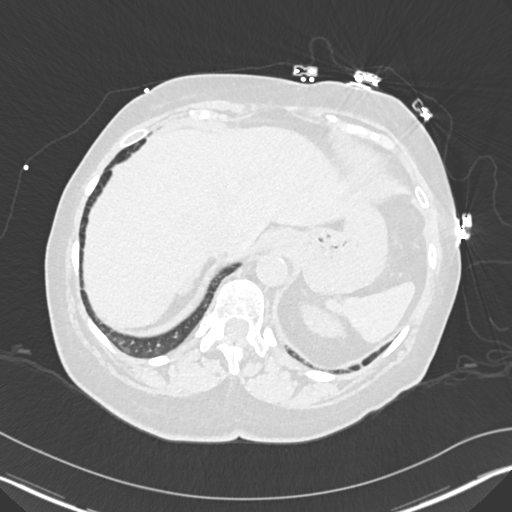
[im 34/145  lung]
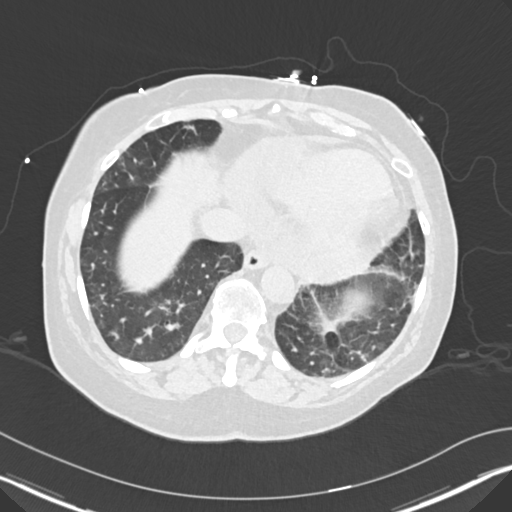
[im 45/145  lung]
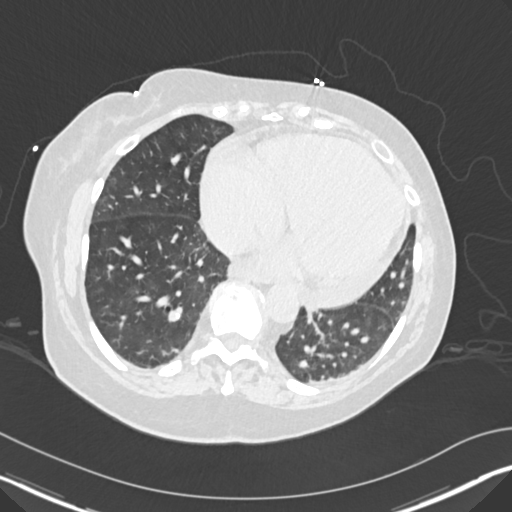
[im 56/145  mediastinal]
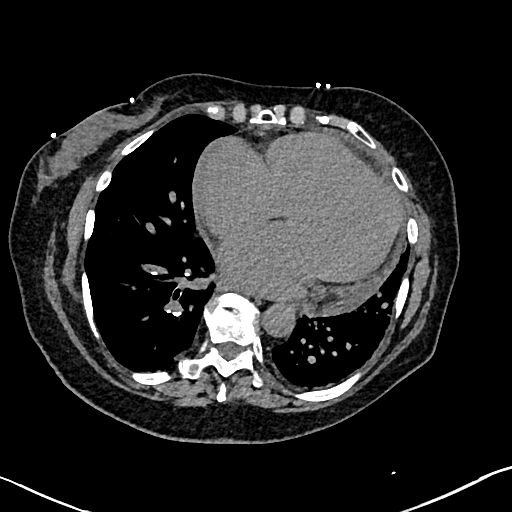
[im 56/145  lung]
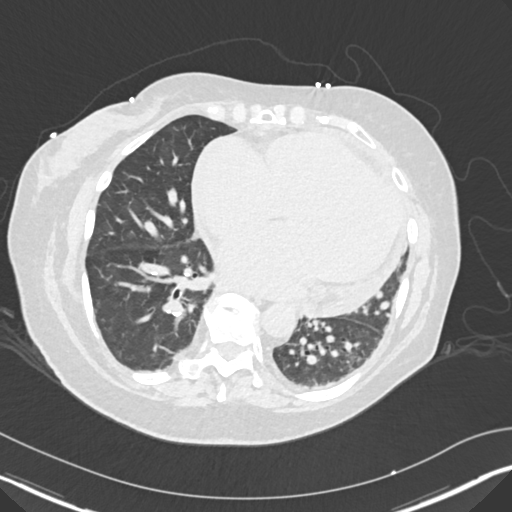
[im 78/145  lung]
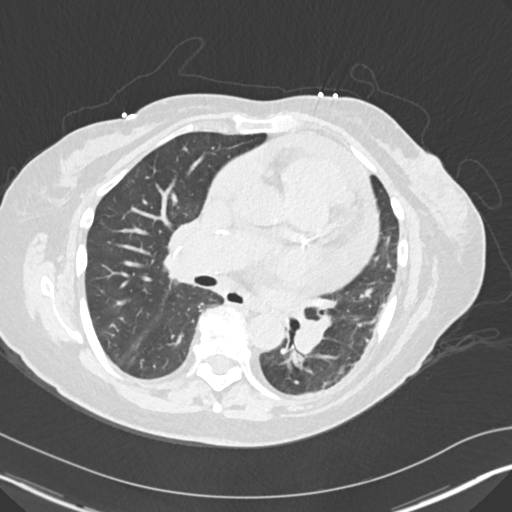
[im 89/145  lung]
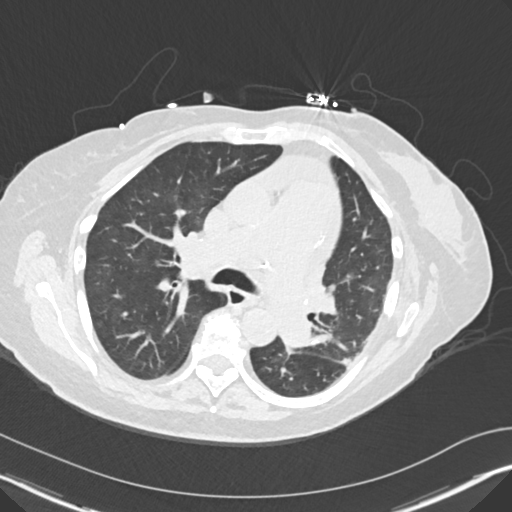
[im 100/145  lung]
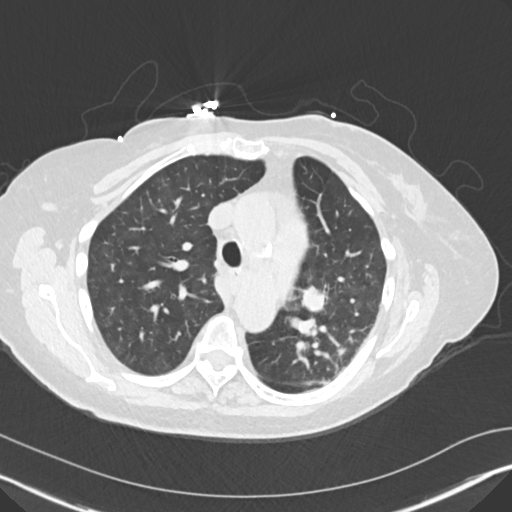
[im 111/145  mediastinal]
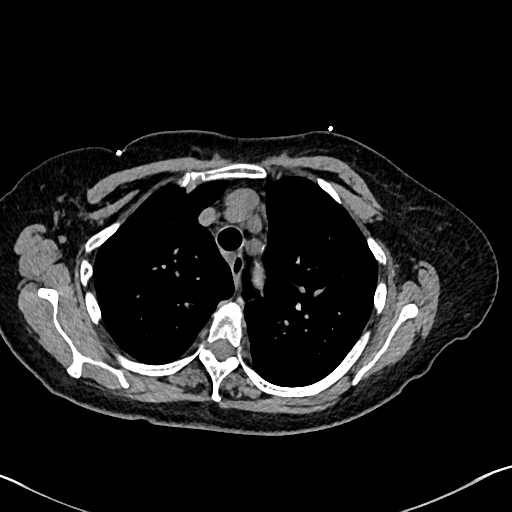
[im 111/145  lung]
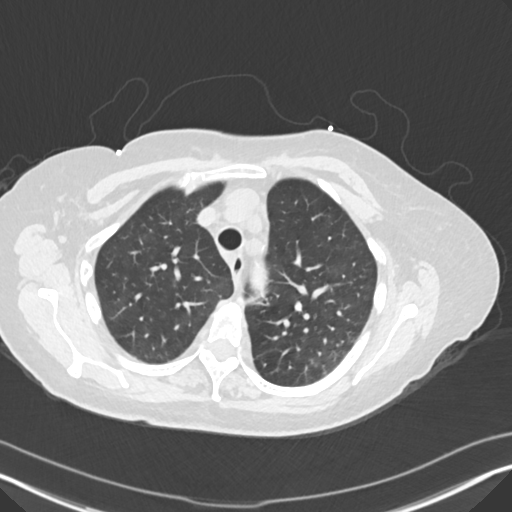
[im 122/145  lung]
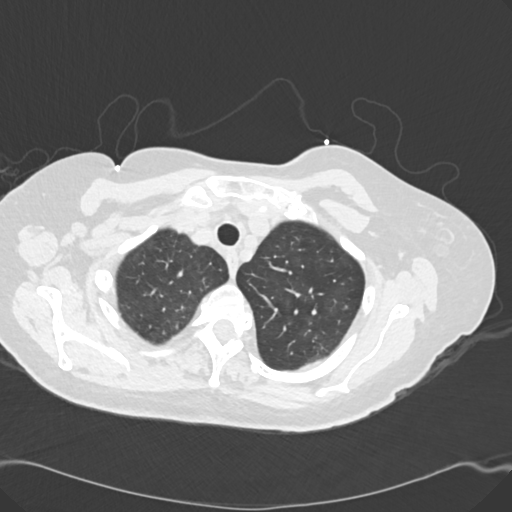
[im 133/145  lung]
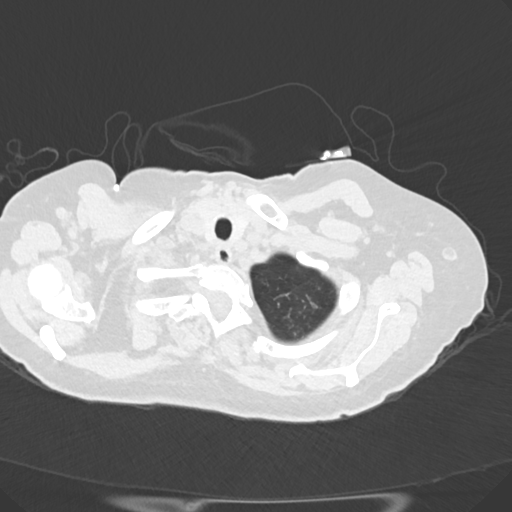

[Series 5: cor · coronal · 0.59mm/px · 3 of 125 slices shown]
[im 25/125  lung]
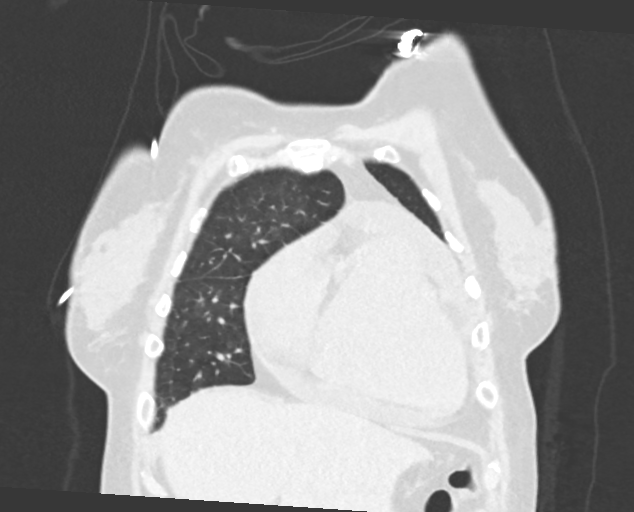
[im 50/125  lung]
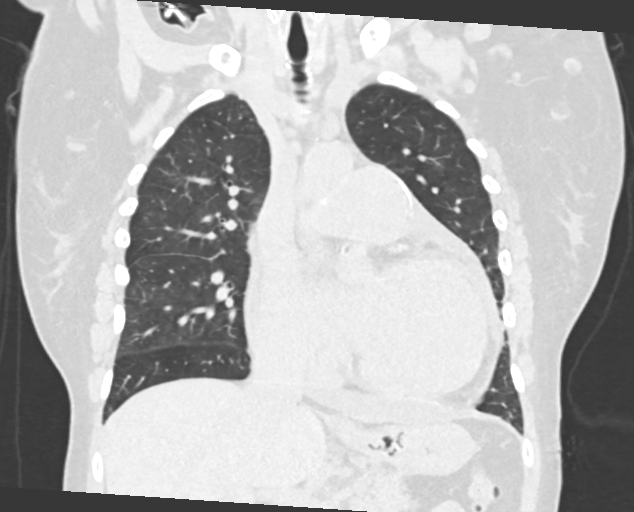
[im 75/125  lung]
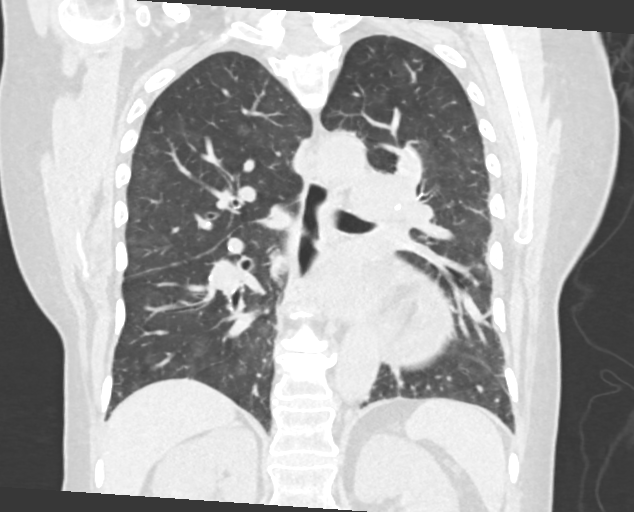

[14 of 36 positions shown; findings below may reference images not displayed]

FINDINGS: Cardiovascular: Intrathoracic aorta normal in caliber. Mild
atherosclerotic change. Visualized great vessels grossly within
normal limits. Prominent cardiomegaly noted. Moderate pericardial
effusion measuring simple fluid density noted. Main pulmonary artery
dilated up to 3.9 cm, which could reflect changes of underlying
pulmonary hypertension.

Mediastinum/Nodes: Visualized thyroid within normal limits. No
visible enlarged mediastinal or hilar lymph nodes on this
noncontrast examination. No axillary adenopathy. Esophagus within
normal limits.

Lungs/Pleura: Tracheobronchial tree intact and patent. Mild volume
loss within the left lung with associated scattered subsegmental
atelectasis within the lingula and left lower lobe. Small area of
patchy and nodular densities within the posterior left upper lobe
(series 3, images 39, 44), nonspecific, but suspected to reflect a
small area of mild infection/pneumonitis given provided history. No
other focal infiltrates or consolidative opacity. No pulmonary edema
or pleural effusion. No pneumothorax. No other visible pulmonary
nodule or mass.

Upper Abdomen: Visualized upper abdomen demonstrates no acute
finding. 8 mm hyperdensity at the posterior aspect of the visualized
left kidney noted, nonspecific, but likely a small proteinaceous
and/or hemorrhagic cyst.

Musculoskeletal: Visualized external soft tissues demonstrate no
acute finding. No acute osseous finding. No discrete or worrisome
osseous lesions. Moderate multilevel thoracic spondylosis noted.
IMPRESSION: 1. Small area of patchy and nodular densities within the posterior
left upper lobe, nonspecific, but suspected to reflect a small area
of mild infection/pneumonitis given provided history.
2. Cardiomegaly with moderate pericardial effusion.
3. Dilatation of the main pulmonary artery up to 3.9 cm, which could
reflect underlying pulmonary hypertension.
4. No other acute cardiopulmonary abnormality identified.
